# Patient Record
Sex: Male | Born: 1945
Health system: Southern US, Community
[De-identification: ages and names within clinical notes are randomized; demographics above are authoritative.]

## PROBLEM LIST (undated history)

## (undated) DIAGNOSIS — E039 Hypothyroidism, unspecified: Secondary | ICD-10-CM

## (undated) DIAGNOSIS — E785 Hyperlipidemia, unspecified: Secondary | ICD-10-CM

## (undated) DIAGNOSIS — R109 Unspecified abdominal pain: Secondary | ICD-10-CM

## (undated) DIAGNOSIS — C61 Malignant neoplasm of prostate: Secondary | ICD-10-CM

## (undated) DIAGNOSIS — N529 Male erectile dysfunction, unspecified: Secondary | ICD-10-CM

## (undated) DIAGNOSIS — R42 Dizziness and giddiness: Secondary | ICD-10-CM

## (undated) DIAGNOSIS — M1990 Unspecified osteoarthritis, unspecified site: Secondary | ICD-10-CM

## (undated) DIAGNOSIS — I10 Essential (primary) hypertension: Secondary | ICD-10-CM

## (undated) HISTORY — DX: Unspecified abdominal pain: R10.9

## (undated) HISTORY — DX: Essential (primary) hypertension: I10

## (undated) HISTORY — DX: Unspecified osteoarthritis, unspecified site: M19.90

## (undated) HISTORY — DX: Hypothyroidism, unspecified: E03.9

## (undated) HISTORY — PX: PROSTATE SURGERY: SHX751

## (undated) HISTORY — DX: Male erectile dysfunction, unspecified: N52.9

## (undated) HISTORY — DX: Malignant neoplasm of prostate: C61

## (undated) HISTORY — DX: Hyperlipidemia, unspecified: E78.5

---

## 2004-01-13 ENCOUNTER — Other Ambulatory Visit: Payer: Self-pay

## 2004-11-16 HISTORY — PX: COLONOSCOPY: SHX174

## 2004-11-16 HISTORY — PX: OTHER SURGICAL HISTORY: SHX169

## 2012-03-21 DIAGNOSIS — R1011 Right upper quadrant pain: Secondary | ICD-10-CM | POA: Diagnosis not present

## 2012-03-21 DIAGNOSIS — E7800 Pure hypercholesterolemia, unspecified: Secondary | ICD-10-CM | POA: Diagnosis not present

## 2012-03-21 DIAGNOSIS — E039 Hypothyroidism, unspecified: Secondary | ICD-10-CM | POA: Diagnosis not present

## 2012-03-21 DIAGNOSIS — Z79899 Other long term (current) drug therapy: Secondary | ICD-10-CM | POA: Diagnosis not present

## 2012-03-23 ENCOUNTER — Ambulatory Visit: Payer: Self-pay | Admitting: Family Medicine

## 2012-03-23 DIAGNOSIS — R1011 Right upper quadrant pain: Secondary | ICD-10-CM | POA: Diagnosis not present

## 2012-08-18 DIAGNOSIS — H00019 Hordeolum externum unspecified eye, unspecified eyelid: Secondary | ICD-10-CM | POA: Diagnosis not present

## 2012-08-24 DIAGNOSIS — H40009 Preglaucoma, unspecified, unspecified eye: Secondary | ICD-10-CM | POA: Diagnosis not present

## 2012-09-07 DIAGNOSIS — H40009 Preglaucoma, unspecified, unspecified eye: Secondary | ICD-10-CM | POA: Diagnosis not present

## 2012-09-14 DIAGNOSIS — H40009 Preglaucoma, unspecified, unspecified eye: Secondary | ICD-10-CM | POA: Diagnosis not present

## 2012-09-21 DIAGNOSIS — H40009 Preglaucoma, unspecified, unspecified eye: Secondary | ICD-10-CM | POA: Diagnosis not present

## 2013-01-12 DIAGNOSIS — I10 Essential (primary) hypertension: Secondary | ICD-10-CM | POA: Diagnosis not present

## 2013-01-12 DIAGNOSIS — E039 Hypothyroidism, unspecified: Secondary | ICD-10-CM | POA: Diagnosis not present

## 2013-01-12 DIAGNOSIS — E785 Hyperlipidemia, unspecified: Secondary | ICD-10-CM | POA: Diagnosis not present

## 2013-01-12 DIAGNOSIS — I43 Cardiomyopathy in diseases classified elsewhere: Secondary | ICD-10-CM | POA: Diagnosis not present

## 2013-03-20 DIAGNOSIS — H40009 Preglaucoma, unspecified, unspecified eye: Secondary | ICD-10-CM | POA: Diagnosis not present

## 2013-04-04 DIAGNOSIS — H40009 Preglaucoma, unspecified, unspecified eye: Secondary | ICD-10-CM | POA: Diagnosis not present

## 2013-09-25 DIAGNOSIS — Z23 Encounter for immunization: Secondary | ICD-10-CM | POA: Diagnosis not present

## 2013-09-25 DIAGNOSIS — I10 Essential (primary) hypertension: Secondary | ICD-10-CM | POA: Diagnosis not present

## 2013-09-25 DIAGNOSIS — I43 Cardiomyopathy in diseases classified elsewhere: Secondary | ICD-10-CM | POA: Diagnosis not present

## 2013-09-25 DIAGNOSIS — E039 Hypothyroidism, unspecified: Secondary | ICD-10-CM | POA: Diagnosis not present

## 2013-09-25 DIAGNOSIS — E785 Hyperlipidemia, unspecified: Secondary | ICD-10-CM | POA: Diagnosis not present

## 2013-10-02 DIAGNOSIS — H40009 Preglaucoma, unspecified, unspecified eye: Secondary | ICD-10-CM | POA: Diagnosis not present

## 2013-10-10 DIAGNOSIS — N529 Male erectile dysfunction, unspecified: Secondary | ICD-10-CM | POA: Diagnosis not present

## 2013-10-10 DIAGNOSIS — C61 Malignant neoplasm of prostate: Secondary | ICD-10-CM | POA: Diagnosis not present

## 2013-11-23 DIAGNOSIS — C61 Malignant neoplasm of prostate: Secondary | ICD-10-CM | POA: Diagnosis not present

## 2013-11-23 DIAGNOSIS — N529 Male erectile dysfunction, unspecified: Secondary | ICD-10-CM | POA: Diagnosis not present

## 2014-04-02 DIAGNOSIS — H40009 Preglaucoma, unspecified, unspecified eye: Secondary | ICD-10-CM | POA: Diagnosis not present

## 2014-06-01 DIAGNOSIS — M778 Other enthesopathies, not elsewhere classified: Secondary | ICD-10-CM | POA: Diagnosis not present

## 2014-06-05 DIAGNOSIS — I43 Cardiomyopathy in diseases classified elsewhere: Secondary | ICD-10-CM | POA: Diagnosis not present

## 2014-06-05 DIAGNOSIS — I10 Essential (primary) hypertension: Secondary | ICD-10-CM | POA: Diagnosis not present

## 2014-06-05 DIAGNOSIS — E785 Hyperlipidemia, unspecified: Secondary | ICD-10-CM | POA: Diagnosis not present

## 2014-06-05 DIAGNOSIS — E039 Hypothyroidism, unspecified: Secondary | ICD-10-CM | POA: Diagnosis not present

## 2014-10-18 DIAGNOSIS — I43 Cardiomyopathy in diseases classified elsewhere: Secondary | ICD-10-CM | POA: Diagnosis not present

## 2014-10-18 DIAGNOSIS — E039 Hypothyroidism, unspecified: Secondary | ICD-10-CM | POA: Diagnosis not present

## 2014-10-18 DIAGNOSIS — E785 Hyperlipidemia, unspecified: Secondary | ICD-10-CM | POA: Diagnosis not present

## 2014-10-18 DIAGNOSIS — I10 Essential (primary) hypertension: Secondary | ICD-10-CM | POA: Diagnosis not present

## 2014-10-22 DIAGNOSIS — E039 Hypothyroidism, unspecified: Secondary | ICD-10-CM | POA: Diagnosis not present

## 2014-10-22 DIAGNOSIS — I10 Essential (primary) hypertension: Secondary | ICD-10-CM | POA: Diagnosis not present

## 2014-10-22 DIAGNOSIS — E785 Hyperlipidemia, unspecified: Secondary | ICD-10-CM | POA: Diagnosis not present

## 2015-05-29 ENCOUNTER — Encounter: Payer: Self-pay | Admitting: *Deleted

## 2015-05-30 ENCOUNTER — Other Ambulatory Visit: Payer: Self-pay | Admitting: Family Medicine

## 2015-05-30 DIAGNOSIS — E785 Hyperlipidemia, unspecified: Secondary | ICD-10-CM

## 2015-05-30 DIAGNOSIS — I10 Essential (primary) hypertension: Secondary | ICD-10-CM

## 2015-05-30 DIAGNOSIS — E039 Hypothyroidism, unspecified: Secondary | ICD-10-CM

## 2015-06-04 ENCOUNTER — Ambulatory Visit (INDEPENDENT_AMBULATORY_CARE_PROVIDER_SITE_OTHER): Payer: Medicare Other | Admitting: Urology

## 2015-06-04 ENCOUNTER — Encounter: Payer: Self-pay | Admitting: Urology

## 2015-06-04 VITALS — BP 132/86 | HR 81 | Resp 18 | Ht 67.0 in | Wt 204.7 lb

## 2015-06-04 DIAGNOSIS — N5231 Erectile dysfunction following radical prostatectomy: Secondary | ICD-10-CM

## 2015-06-04 DIAGNOSIS — Z8546 Personal history of malignant neoplasm of prostate: Secondary | ICD-10-CM

## 2015-06-04 DIAGNOSIS — N529 Male erectile dysfunction, unspecified: Secondary | ICD-10-CM | POA: Insufficient documentation

## 2015-06-04 LAB — BLADDER SCAN AMB NON-IMAGING

## 2015-06-04 NOTE — Progress Notes (Signed)
06/04/2015 5:17 PM   Daniel Malady Sr. 1946/08/29 [id number]  Referring provider: No referring provider defined for this encounter.  Chief Complaint  Patient presents with  . Erectile Dysfunction    6 month recheck  . Prostate Cancer    HPI: [name] is a 69 year old African-American male with a history of prostate cancer who is status post robotic prostatectomy in 2006 with adjunctive radiation for positive surgical margins.  PCa History:  He underwent prostate biopsy on 05/25/05 for a PSA of 8.2 ng/mL, which showed bilateral prostatic adenocarcinoma, Gleason 3 + 4, in the left apex, right mid gland, and right base, 3/6 cores positive. The patient was sent to [facility] for further evaluation and care.  The patient was evaluated by Dr. Lyn Henri. He underwent laparoscopic robotic radical prostatectomy and right pelvic lymph node dissection on 08/26/05. Pathology showed bilateral prostatic adenocarcinoma, Gleason 3 + 4, involving 20% of the gland, no extracapsular extension, no seminal vesical invasion, positive extensive perineural invasion, no vascular invasion, positive right peripheral surgical margins, and no regional lymph nodes. The patient's postoperative course has been uncomplicated. However, given the positive margins, the patient now presents to our clinic for a discussion of further treatment options. The patient requests we proceed with radiation treatment. He then received adjuvant radiation therapy which he then completed in 01/2006. Since that point in time, his PSA's have been undetectable.    Patient also suffers from erectile dysfunction his SH IM score is 5, which is severe erectile dysfunction function. He does admit to nocturnal tumescence, but he is unable to achieve or maintain an erection satisfactory enough for intercourse.  He has tried PDE 5 inhibitors in the past and Trymex injections with limited success. When I saw him last year we have  briefly discussed penile prosthesis placement and I have given him a DVD. He did have the opportunity to watch the video, but he does not want to pursue surgical intervention at this time.      SHIM      06/04/15 1652       SHIM: Over the last 6 months:   How do you rate your confidence that you could get and keep an erection? Very Low     When you had erections with sexual stimulation, how often were your erections hard enough for penetration (entering your partner)? Almost Never or Never     During sexual intercourse, how often were you able to maintain your erection after you had penetrated (entered) your partner? Extremely Difficult     During sexual intercourse, how difficult was it to maintain your erection to completion of intercourse? Extremely Difficult     When you attempted sexual intercourse, how often was it satisfactory for you? Extremely Difficult     SHIM Total Score   SHIM 5        Score: 1-7 Severe ED 8-11 Moderate ED 12-16 Mild-Moderate ED 17-21 Mild ED 22-25 No ED       PMH: Past Medical History  Diagnosis Date  . Prostate cancer   . ED (erectile dysfunction)   . HLD (hyperlipidemia)   . Abdominal pain     RUQ  . Hypothyroid   . Hypertension     Surgical History: Past Surgical History  Procedure Laterality Date  . Robotic prostatectomy  2006    Home Medications:    Medication List       This list is accurate as of: 06/04/15  5:17  PM.  Always use your most recent med list.               benazepril 10 MG tablet  Commonly known as:  LOTENSIN  Take 10 mg by mouth daily.     benazepril-hydrochlorthiazide 10-12.5 MG per tablet  Commonly known as:  LOTENSIN HCT  TAKE 1 TABLET BY MOUTH EVERY DAY     pravastatin 10 MG tablet  Commonly known as:  PRAVACHOL  TAKE 1 TABLET BY MOUTH EVERY DAY     SYNTHROID 100 MCG tablet  Generic drug:  levothyroxine  TAKE 1 TABLET BY MOUTH EVERY DAY        Allergies: No Known Allergies  Family  History: Family History  Problem Relation Age of Onset  . Diabetes    . Heart attack Father     Social History:  reports that he has quit smoking. He does not have any smokeless tobacco history on file. He reports that he drinks alcohol. His drug history is not on file.  ROS: UROLOGY Frequent Urination?: No Hard to postpone urination?: No Burning/pain with urination?: No Get up at night to urinate?: No Leakage of urine?: No Urine stream starts and stops?: No Trouble starting stream?: No Do you have to strain to urinate?: No Blood in urine?: No Urinary tract infection?: No Sexually transmitted disease?: No Injury to kidneys or bladder?: No Painful intercourse?: No Weak stream?: No Erection problems?: Yes Penile pain?: No  Gastrointestinal Nausea?: No Vomiting?: No Indigestion/heartburn?: No Diarrhea?: No Constipation?: No  Constitutional Fever: No Night sweats?: No Weight loss?: No Fatigue?: No  Skin Skin rash/lesions?: No Itching?: No  Eyes Blurred vision?: No Double vision?: No  Ears/Nose/Throat Sore throat?: No Sinus problems?: No  Hematologic/Lymphatic Swollen glands?: No Easy bruising?: No  Cardiovascular Leg swelling?: No Chest pain?: No  Respiratory Cough?: No Shortness of breath?: No  Endocrine Excessive thirst?: No  Musculoskeletal Back pain?: No Joint pain?: No  Neurological Headaches?: No Dizziness?: No  Psychologic Depression?: No Anxiety?: No  Physical Exam: BP 132/86 mmHg  Pulse 81  Resp 18  Ht 5\' 7"  (1.702 m)  Wt 204 lb 11.2 oz (92.851 kg)  BMI 32.05 kg/m2  GU: Patient with circumcised phallus.  Urethral meatus is patent.  No penile discharge. No penile lesions or rashes. Scrotum without lesions, cysts, rashes and/or edema.  Testicles are located scrotally bilaterally. No masses are appreciated in the testicles. Left and right epididymis are normal.  Rectal: Patient with  normal sphincter tone. Perineum without  scarring or rashes. No rectal masses are appreciated. Prostate and seminal vesicles are surgically absent.   Laboratory Data: No results found for: WBC, HGB, HCT, MCV, PLT  No results found for: CREATININE  No results found for: PSA  No results found for: TESTOSTERONE  No results found for: HGBA1C  Urinalysis No results found for: COLORURINE, APPEARANCEUR, LABSPEC, PHURINE, GLUCOSEU, HGBUR, BILIRUBINUR, KETONESUR, PROTEINUR, UROBILINOGEN, NITRITE, LEUKOCYTESUR  Pertinent Imaging:   Assessment & Plan:    1. Erectile dysfunction:  Patient has suffered with erectile dysfunction since his robotic prostatectomy in 2006. He has tried PDE 5 inhibitors and Tri-mix injections with limited success. He did review the DVD for penile prosthesis, but he is not interested in surgical intervention at this time. He would like to try PDE 5 inhibitor again and if that is ineffective he would like to try the tri-mix injections. I have given him samples of 20 mg on demand Cialis. If they are ineffective, he will  [facility] his tri-mix prescription and schedule a tri-mix injection with [place].  1. History of prostate cancer:  3+4=7, pT2c NXMX adenocarcinoma of the prostate, status post radical prostatectomy 08/26/05  Gleason 3+4=7 prostate cancer.  - (08/2005) robotic prostatectomy - Gleason 3+4=7, pT2c NXMX  - (08/2005-01/2006) adjuvant radiation therapy - PSA undetectable  - PSA - BLADDER SCAN AMB NON-IMAGING   No Follow-up on file.  Zara Council, [facility] [address] [place], [place] [address] [phone]

## 2015-06-05 ENCOUNTER — Telehealth: Payer: Self-pay

## 2015-06-05 LAB — PSA: Prostate Specific Ag, Serum: <0.1 ng/mL (ref 0.0–4.0)

## 2015-06-05 NOTE — Telephone Encounter (Signed)
Spoke with pt in reference to PSA. Pt states he has not used the Cialis yet, but will call back with results.

## 2015-06-05 NOTE — Telephone Encounter (Signed)
-----   Message from Nori Riis, PA-C sent at 06/05/2015 10:15 AM EDT ----- PSA remains undetectable.  Patient was given Cialis samples at his appointment. Did he find them effective?

## 2015-06-24 ENCOUNTER — Ambulatory Visit: Payer: Self-pay | Admitting: Urology

## 2015-07-11 ENCOUNTER — Other Ambulatory Visit: Payer: Self-pay

## 2015-07-11 DIAGNOSIS — I10 Essential (primary) hypertension: Secondary | ICD-10-CM

## 2015-07-11 DIAGNOSIS — E039 Hypothyroidism, unspecified: Secondary | ICD-10-CM

## 2015-07-11 MED ORDER — LEVOTHYROXINE SODIUM 100 MCG PO TABS: 100.0000 ug | ORAL_TABLET | Freq: Every day | ORAL | Status: DC

## 2015-07-11 MED ORDER — BENAZEPRIL-HYDROCHLOROTHIAZIDE 10-12.5 MG PO TABS: 1.0000 | ORAL_TABLET | Freq: Every day | ORAL | Status: DC

## 2015-07-24 ENCOUNTER — Ambulatory Visit (INDEPENDENT_AMBULATORY_CARE_PROVIDER_SITE_OTHER): Payer: Medicare Other | Admitting: Family Medicine

## 2015-07-24 ENCOUNTER — Telehealth: Payer: Self-pay | Admitting: Urology

## 2015-07-24 ENCOUNTER — Encounter: Payer: Self-pay | Admitting: Family Medicine

## 2015-07-24 VITALS — BP 122/72 | HR 80 | Ht 67.0 in | Wt 207.0 lb

## 2015-07-24 DIAGNOSIS — M19072 Primary osteoarthritis, left ankle and foot: Secondary | ICD-10-CM | POA: Diagnosis not present

## 2015-07-24 DIAGNOSIS — H8309 Labyrinthitis, unspecified ear: Secondary | ICD-10-CM

## 2015-07-24 DIAGNOSIS — E785 Hyperlipidemia, unspecified: Secondary | ICD-10-CM | POA: Diagnosis not present

## 2015-07-24 DIAGNOSIS — F321 Major depressive disorder, single episode, moderate: Secondary | ICD-10-CM

## 2015-07-24 DIAGNOSIS — Z1212 Encounter for screening for malignant neoplasm of rectum: Secondary | ICD-10-CM | POA: Diagnosis not present

## 2015-07-24 DIAGNOSIS — E039 Hypothyroidism, unspecified: Secondary | ICD-10-CM | POA: Diagnosis not present

## 2015-07-24 DIAGNOSIS — I10 Essential (primary) hypertension: Secondary | ICD-10-CM

## 2015-07-24 DIAGNOSIS — R739 Hyperglycemia, unspecified: Secondary | ICD-10-CM

## 2015-07-24 LAB — POCT URINALYSIS DIPSTICK
BILIRUBIN UA: NEGATIVE
Glucose, UA: NEGATIVE
KETONES UA: NEGATIVE
Leukocytes, UA: NEGATIVE
Nitrite, UA: NEGATIVE
PH UA: 5
PROTEIN UA: NEGATIVE
RBC UA: NEGATIVE
Spec Grav, UA: 1.01
Urobilinogen, UA: 0.2

## 2015-07-24 LAB — HEMOCCULT GUIAC POC 1CARD (OFFICE): Fecal Occult Blood, POC: POSITIVE — AB

## 2015-07-24 MED ORDER — BENAZEPRIL-HYDROCHLOROTHIAZIDE 10-12.5 MG PO TABS: 1.0000 | ORAL_TABLET | Freq: Every day | ORAL | Status: DC

## 2015-07-24 MED ORDER — LEVOTHYROXINE SODIUM 100 MCG PO TABS: 100.0000 ug | ORAL_TABLET | Freq: Every day | ORAL | Status: DC

## 2015-07-24 MED ORDER — MELOXICAM 7.5 MG PO TABS: 7.5000 mg | ORAL_TABLET | Freq: Every day | ORAL | Status: DC

## 2015-07-24 MED ORDER — MECLIZINE HCL 32 MG PO TABS: 32.0000 mg | ORAL_TABLET | Freq: Three times a day (TID) | ORAL | Status: DC | PRN

## 2015-07-24 MED ORDER — SERTRALINE HCL 50 MG PO TABS: 50.0000 mg | ORAL_TABLET | Freq: Every day | ORAL | Status: DC

## 2015-07-24 MED ORDER — PRAVASTATIN SODIUM 10 MG PO TABS: 10.0000 mg | ORAL_TABLET | Freq: Every day | ORAL | Status: DC

## 2015-07-24 NOTE — Telephone Encounter (Signed)
Returning pt's call re: test results.  Left a message requesting a call back.

## 2015-07-24 NOTE — Progress Notes (Signed)
Name: Daniel Kirby   MRN: [id number]    DOB: Mar 02, 1946   Date:07/24/2015       Progress Note  Subjective  Chief Complaint  Chief Complaint  Patient presents with  . Hypothyroidism  . Hypertension  . Hyperlipidemia  . Depression    Hypertension This is a chronic problem. The current episode started more than 1 year ago. The problem has been gradually improving since onset. Pertinent negatives include no anxiety, blurred vision, chest pain, headaches, malaise/fatigue, neck pain, orthopnea, palpitations, peripheral edema, PND, shortness of breath or sweats. There are no associated agents to hypertension. Risk factors for coronary artery disease include dyslipidemia, obesity and male gender. Past treatments include ACE inhibitors and diuretics. The current treatment provides mild improvement. There are no compliance problems.  There is no history of angina, kidney disease, CAD/MI, CVA, heart failure, left ventricular hypertrophy, PVD, renovascular disease or retinopathy. There is no history of chronic renal disease.  Hyperlipidemia This is a chronic problem. The problem is controlled. Recent lipid tests were reviewed and are normal. He has no history of chronic renal disease, diabetes, hypothyroidism, liver disease, obesity or nephrotic syndrome. Pertinent negatives include no chest pain, focal sensory loss, focal weakness, leg pain, myalgias or shortness of breath. Current antihyperlipidemic treatment includes statins. The current treatment provides no improvement of lipids. There are no compliance problems.  Risk factors for coronary artery disease include dyslipidemia.  Depression        This is a new problem.  The current episode started more than 1 month ago.   The onset quality is gradual.   Associated symptoms include no decreased concentration, no fatigue, no helplessness, no hopelessness, not irritable, no restlessness, no decreased interest, no appetite change, no body aches, no myalgias, no  headaches, no indigestion, not sad and no suicidal ideas.  Past treatments include nothing.   Pertinent negatives include no hypothyroidism and no anxiety. Diabetes He presents for his initial diabetic visit. Pertinent negatives for hypoglycemia include no confusion, dizziness, headaches, hunger, mood changes, nervousness/anxiousness, pallor, seizures, sleepiness, speech difficulty, sweats or tremors. Pertinent negatives for diabetes include no blurred vision, no chest pain, no fatigue, no foot paresthesias, no foot ulcerations, no polydipsia, no polyphagia, no polyuria, no visual change, no weakness and no weight loss. Pertinent negatives for diabetic complications include no autonomic neuropathy, CVA, impotence, nephropathy, PVD or retinopathy. His lunch blood glucose is taken between 2-3 pm. His lunch blood glucose range is generally 140-180 mg/dl. An ACE inhibitor/angiotensin II receptor blocker is being taken. He does not see a podiatrist.Eye exam is not current.    No problem-specific assessment & plan notes found for this encounter.   Past Medical History  Diagnosis Date  . Prostate cancer   . ED (erectile dysfunction)   . HLD (hyperlipidemia)   . Abdominal pain     RUQ  . Hypothyroid   . Hypertension   . Arthritis     Past Surgical History  Procedure Laterality Date  . Robotic prostatectomy  2006  . Colonoscopy  2006    normal    Family History  Problem Relation Age of Onset  . Diabetes    . Heart attack Father     Social History   Social History  . Marital Status: Married    Spouse Name: N/A  . Number of Children: N/A  . Years of Education: N/A   Occupational History  . Not on file.   Social History Main Topics  . Smoking status:  Former Smoker  . Smokeless tobacco: Not on file     Comment: smokes a cigar every 6 months  . Alcohol Use: 0.0 oz/week    0 Standard drinks or equivalent per week     Comment: occasional  . Drug Use: No  . Sexual Activity: Yes    Other Topics Concern  . Not on file   Social History Narrative    No Known Allergies   Review of Systems  Constitutional: Negative for weight loss, malaise/fatigue, appetite change and fatigue.  Eyes: Negative for blurred vision.  Respiratory: Negative for shortness of breath.   Cardiovascular: Negative for chest pain, palpitations, orthopnea and PND.  Genitourinary: Negative for impotence.  Musculoskeletal: Negative for myalgias and neck pain.  Skin: Negative for pallor.  Neurological: Negative for dizziness, tremors, focal weakness, seizures, speech difficulty, weakness and headaches.  Endo/Heme/Allergies: Negative for polydipsia and polyphagia.  Psychiatric/Behavioral: Positive for depression. Negative for suicidal ideas, confusion and decreased concentration. The patient is not nervous/anxious.      Objective  Filed Vitals:   07/24/15 0912  BP: 122/72  Pulse: 80  Height: 5\' 7"  (1.702 m)  Weight: 207 lb (93.895 kg)    Physical Exam  Constitutional: He is oriented to person, place, and time and well-developed, well-nourished, and in no distress. He is not irritable.  HENT:  Head: Normocephalic.  Right Ear: External ear normal.  Left Ear: External ear normal.  Nose: Nose normal.  Mouth/Throat: Oropharynx is clear and moist.  Eyes: Conjunctivae and EOM are normal. Pupils are equal, round, and reactive to light. Right eye exhibits no discharge. Left eye exhibits no discharge. No scleral icterus.  Neck: Normal range of motion. Neck supple. No JVD present. No tracheal deviation present. No thyromegaly present.  Cardiovascular: Normal rate, regular rhythm, normal heart sounds and intact distal pulses.  Exam reveals no gallop and no friction rub.   No murmur heard. Pulmonary/Chest: Breath sounds normal. No respiratory distress. He has no wheezes. He has no rales.  Abdominal: Soft. Bowel sounds are normal. He exhibits no mass. There is no hepatosplenomegaly. There is no  tenderness. There is no rebound, no guarding and no CVA tenderness.  Genitourinary: Rectal exam shows no external hemorrhoid, no internal hemorrhoid, no mass and no tenderness. Guaiac positive stool. Prostate is enlarged.  Musculoskeletal: Normal range of motion. He exhibits no edema or tenderness.  Lymphadenopathy:    He has no cervical adenopathy.  Neurological: He is alert and oriented to person, place, and time. He has normal sensation, normal strength, normal reflexes and intact cranial nerves. No cranial nerve deficit.  Skin: Skin is warm. No rash noted.  Psychiatric: Mood and affect normal.      Assessment & Plan  Problem List Items Addressed This Visit    None    Visit Diagnoses    Essential hypertension    -  Primary    Relevant Medications    benazepril-hydrochlorthiazide (LOTENSIN HCT) 10-12.5 MG per tablet    pravastatin (PRAVACHOL) 10 MG tablet    Other Relevant Orders    Renal Function Panel    POCT Urinalysis Dipstick (Completed)    Hyperlipidemia        Relevant Medications    benazepril-hydrochlorthiazide (LOTENSIN HCT) 10-12.5 MG per tablet    pravastatin (PRAVACHOL) 10 MG tablet    Other Relevant Orders    Lipid Profile    Hypothyroidism, unspecified hypothyroidism type        Relevant Medications    levothyroxine (SYNTHROID)  100 MCG tablet    Other Relevant Orders    TSH    Major depressive disorder, single episode, moderate        Relevant Medications    sertraline (ZOLOFT) 50 MG tablet    Hyperglycemia        Relevant Orders    HgB A1c    Primary osteoarthritis of left foot        Relevant Medications    meloxicam (MOBIC) 7.5 MG tablet    Screening for rectal cancer        Relevant Orders    POCT Occult Blood Stool (Completed)    Labyrinthitis, unspecified laterality        Relevant Medications    meclizine (ANTIVERT) 32 MG tablet         Dr. Deanna Jones [facility]  07/24/2015

## 2015-07-24 NOTE — Telephone Encounter (Signed)
Pt requested the date of his last colonoscopy.  I gave him the information he requested; he had no further questions. . . sm

## 2015-07-24 NOTE — Addendum Note (Signed)
Addended by: Fredderick Severance on: 07/24/2015 04:49 PM   Modules accepted: Orders

## 2015-07-24 NOTE — Patient Instructions (Signed)

## 2015-07-25 LAB — TSH: TSH: 1.27 u[IU]/mL (ref 0.450–4.500)

## 2015-07-25 LAB — LIPID PANEL
CHOLESTEROL TOTAL: 151 mg/dL (ref 100–199)
Chol/HDL Ratio: 3.8 ratio units (ref 0.0–5.0)
HDL: 40 mg/dL (ref >39)
LDL Calculated: 91 mg/dL (ref 0–99)
Triglycerides: 99 mg/dL (ref 0–149)
VLDL Cholesterol Cal: 20 mg/dL (ref 5–40)

## 2015-07-25 LAB — HEMOGLOBIN A1C
Est. average glucose Bld gHb Est-mCnc: 117 mg/dL
HEMOGLOBIN A1C: 5.7 % — AB (ref 4.8–5.6)

## 2015-07-25 LAB — RENAL FUNCTION PANEL
ALBUMIN: 4.4 g/dL (ref 3.6–4.8)
BUN/Creatinine Ratio: 20 (ref 10–22)
BUN: 18 mg/dL (ref 8–27)
CALCIUM: 9.7 mg/dL (ref 8.6–10.2)
CHLORIDE: 98 mmol/L (ref 97–108)
CO2: 22 mmol/L (ref 18–29)
Creatinine, Ser: 0.91 mg/dL (ref 0.76–1.27)
GFR calc non Af Amer: 86 mL/min/{1.73_m2} (ref >59)
GFR, EST AFRICAN AMERICAN: 99 mL/min/{1.73_m2} (ref >59)
GLUCOSE: 91 mg/dL (ref 65–99)
PHOSPHORUS: 2.6 mg/dL (ref 2.5–4.5)
POTASSIUM: 4 mmol/L (ref 3.5–5.2)
Sodium: 137 mmol/L (ref 134–144)

## 2015-07-31 ENCOUNTER — Other Ambulatory Visit: Payer: Self-pay

## 2015-07-31 ENCOUNTER — Telehealth: Payer: Self-pay | Admitting: Gastroenterology

## 2015-07-31 NOTE — Telephone Encounter (Signed)
Gastroenterology Pre-Procedure Review  Request Date: 08-30-2015 Requesting Physician: Dr.   PATIENT REVIEW QUESTIONS: The patient responded to the following health history questions as indicated:    1. Are you having any GI issues? no 2. Do you have a personal history of Polyps? no 3. Do you have a family history of Colon Cancer or Polyps? no 4. Diabetes Mellitus? no 5. Joint replacements in the past 12 months?no 6. Major health problems in the past 3 months?no 7. Any artificial heart valves, MVP, or defibrillator?no    MEDICATIONS & ALLERGIES:    Patient reports the following regarding taking any anticoagulation/antiplatelet therapy:   Plavix, Coumadin, Eliquis, Xarelto, Lovenox, Pradaxa, Brilinta, or Effient? no Aspirin? no  Patient confirms/reports the following medications:  Current Outpatient Prescriptions  Medication Sig Dispense Refill   benazepril-hydrochlorthiazide (LOTENSIN HCT) 10-12.5 MG per tablet Take 1 tablet by mouth daily. 30 tablet 6   levothyroxine (SYNTHROID) 100 MCG tablet Take 1 tablet (100 mcg total) by mouth daily. 30 tablet 6   meclizine (ANTIVERT) 32 MG tablet Take 1 tablet (32 mg total) by mouth 3 (three) times daily as needed. 30 tablet 1   meloxicam (MOBIC) 7.5 MG tablet Take 1 tablet (7.5 mg total) by mouth daily. 30 tablet 1   pravastatin (PRAVACHOL) 10 MG tablet Take 1 tablet (10 mg total) by mouth daily. 30 tablet 5   sertraline (ZOLOFT) 50 MG tablet Take 1 tablet (50 mg total) by mouth daily. 30 tablet 3   No current facility-administered medications for this visit.    Patient confirms/reports the following allergies:  No Known Allergies  No orders of the defined types were placed in this encounter.    AUTHORIZATION INFORMATION Primary Insurance: 1D#: Group #:  Secondary Insurance: 1D#: Group #:  SCHEDULE INFORMATION: Date: 08-30-2015 Time: Location:[facility]

## 2015-08-05 ENCOUNTER — Other Ambulatory Visit: Payer: Self-pay

## 2015-08-07 ENCOUNTER — Other Ambulatory Visit: Payer: Self-pay

## 2015-08-08 ENCOUNTER — Ambulatory Visit: Payer: Medicare Other | Admitting: Family Medicine

## 2015-08-20 ENCOUNTER — Encounter: Payer: Self-pay | Admitting: Family Medicine

## 2015-08-20 ENCOUNTER — Ambulatory Visit (INDEPENDENT_AMBULATORY_CARE_PROVIDER_SITE_OTHER): Payer: Medicare Other | Admitting: Family Medicine

## 2015-08-20 VITALS — BP 130/64 | HR 76 | Ht 67.0 in | Wt 204.0 lb

## 2015-08-20 DIAGNOSIS — F324 Major depressive disorder, single episode, in partial remission: Secondary | ICD-10-CM | POA: Diagnosis not present

## 2015-08-20 DIAGNOSIS — Z23 Encounter for immunization: Secondary | ICD-10-CM

## 2015-08-20 DIAGNOSIS — F321 Major depressive disorder, single episode, moderate: Secondary | ICD-10-CM | POA: Diagnosis not present

## 2015-08-20 MED ORDER — SERTRALINE HCL 50 MG PO TABS: 50.0000 mg | ORAL_TABLET | Freq: Every day | ORAL | Status: DC

## 2015-08-20 NOTE — Progress Notes (Signed)
Name: Daniel Kirby   MRN: [id number]    DOB: [birth year]   Date:08/20/2015       Progress Note  Subjective  Chief Complaint  Chief Complaint  Patient presents with  . Depression    started Sertraline- feeling better "about to be back to my old self again"    Depression      The patient presents with depression.  This is a new problem.  The current episode started more than 1 month ago.   The onset quality is gradual.   The problem has been gradually improving since onset.  Associated symptoms include no decreased concentration, no fatigue, no helplessness, no hopelessness, does not have insomnia, not irritable, no restlessness, no decreased interest, no appetite change, no body aches, no myalgias, no headaches, no indigestion, not sad and no suicidal ideas.  Past treatments include SSRIs - Selective serotonin reuptake inhibitors.  Compliance with treatment is good.  Past medical history includes depression.     Pertinent negatives include no chronic fatigue syndrome, no chronic illness and no anxiety.   No problem-specific assessment & plan notes found for this encounter.   Past Medical History  Diagnosis Date  . Prostate cancer ([place])   . ED (erectile dysfunction)   . HLD (hyperlipidemia)   . Abdominal pain     RUQ  . Hypothyroid   . Hypertension   . Arthritis     Past Surgical History  Procedure Laterality Date  . Robotic prostatectomy  2006  . Colonoscopy  2006    normal    Family History  Problem Relation Age of Onset  . Diabetes    . Heart attack Father     Social History   Social History  . Marital Status: Married    Spouse Name: N/A  . Number of Children: N/A  . Years of Education: N/A   Occupational History  . Not on file.   Social History Main Topics  . Smoking status: Former Research scientist (life sciences)  . Smokeless tobacco: Not on file     Comment: smokes a cigar every 6 months  . Alcohol Use: 0.0 oz/week    0 Standard drinks or equivalent per week     Comment:  occasional  . Drug Use: No  . Sexual Activity: Yes   Other Topics Concern  . Not on file   Social History Narrative    No Known Allergies   Review of Systems  Constitutional: Negative for fever, chills, weight loss, malaise/fatigue, appetite change and fatigue.  HENT: Negative for ear discharge, ear pain and sore throat.   Eyes: Negative for blurred vision.  Respiratory: Negative for cough, sputum production, shortness of breath and wheezing.   Cardiovascular: Negative for chest pain, palpitations and leg swelling.  Gastrointestinal: Negative for heartburn, nausea, abdominal pain, diarrhea, constipation, blood in stool and melena.  Genitourinary: Negative for dysuria, urgency, frequency and hematuria.  Musculoskeletal: Negative for myalgias, back pain, joint pain and neck pain.  Skin: Negative for rash.  Neurological: Negative for dizziness, tingling, sensory change, focal weakness and headaches.  Endo/Heme/Allergies: Negative for environmental allergies and polydipsia. Does not bruise/bleed easily.  Psychiatric/Behavioral: Positive for depression. Negative for suicidal ideas and decreased concentration. The patient is not nervous/anxious and does not have insomnia.      Objective  Filed Vitals:   08/20/15 1335  BP: 130/64  Pulse: 76  Height: 5\' 7"  (1.702 m)  Weight: 204 lb (92.534 kg)    Physical Exam  Constitutional: He is oriented  to person, place, and time and well-developed, well-nourished, and in no distress. He is not irritable.  HENT:  Head: Normocephalic.  Right Ear: External ear normal.  Left Ear: External ear normal.  Nose: Nose normal.  Mouth/Throat: Oropharynx is clear and moist.  Eyes: Conjunctivae and EOM are normal. Pupils are equal, round, and reactive to light. Right eye exhibits no discharge. Left eye exhibits no discharge. No scleral icterus.  Neck: Normal range of motion. Neck supple. No JVD present. No tracheal deviation present. No thyromegaly  present.  Cardiovascular: Normal rate, regular rhythm, normal heart sounds and intact distal pulses.  Exam reveals no gallop and no friction rub.   No murmur heard. Pulmonary/Chest: Breath sounds normal. No respiratory distress. He has no wheezes. He has no rales.  Abdominal: Soft. Bowel sounds are normal. He exhibits no mass. There is no hepatosplenomegaly. There is no tenderness. There is no rebound, no guarding and no CVA tenderness.  Musculoskeletal: Normal range of motion. He exhibits no edema or tenderness.  Lymphadenopathy:    He has no cervical adenopathy.  Neurological: He is alert and oriented to person, place, and time. He has normal sensation, normal strength, normal reflexes and intact cranial nerves. No cranial nerve deficit.  Skin: Skin is warm. No rash noted.  Psychiatric: Mood and affect normal.      Assessment & Plan  Problem List Items Addressed This Visit      Other   Major depressive disorder with single episode, in partial remission ([place]) - Primary   Relevant Medications   sertraline (ZOLOFT) 50 MG tablet    Other Visit Diagnoses    Major depressive disorder, single episode, moderate ([facility])        Relevant Medications    sertraline (ZOLOFT) 50 MG tablet         Dr. Ahnaf Caponi [facility]  08/20/2015

## 2015-08-20 NOTE — Addendum Note (Signed)
Addended by: Fredderick Severance on: 08/20/2015 02:08 PM   Modules accepted: Orders

## 2015-08-29 ENCOUNTER — Encounter: Payer: Self-pay | Admitting: *Deleted

## 2015-08-30 NOTE — Anesthesia Preprocedure Evaluation (Deleted)
Anesthesia Evaluation    Airway       Dental   Pulmonary former smoker,          Cardiovascular hypertension,     Neuro/Psych    GI/Hepatic   Endo/Other    Renal/GU      Musculoskeletal   Abdominal   Peds  Hematology   Anesthesia Other Findings   Reproductive/Obstetrics                          Anesthesia Physical Anesthesia Plan Anesthesia Quick Evaluation  

## 2015-09-05 NOTE — Discharge Instructions (Signed)

## 2015-09-06 ENCOUNTER — Ambulatory Visit: Payer: Medicare Other | Admitting: Anesthesiology

## 2015-09-06 ENCOUNTER — Ambulatory Visit
Admission: RE | Admit: 2015-09-06 | Discharge: 2015-09-06 | Disposition: A | Discharge status: Home / Self Care | Payer: Medicare Other | Source: Ambulatory Visit | Attending: Gastroenterology | Admitting: Gastroenterology

## 2015-09-06 ENCOUNTER — Encounter
Admission: RE | Disposition: A | Discharge status: Home / Self Care | Payer: Self-pay | Source: Ambulatory Visit | Attending: Gastroenterology

## 2015-09-06 ENCOUNTER — Other Ambulatory Visit: Payer: Self-pay | Admitting: Gastroenterology

## 2015-09-06 DIAGNOSIS — K641 Second degree hemorrhoids: Secondary | ICD-10-CM | POA: Insufficient documentation

## 2015-09-06 DIAGNOSIS — I10 Essential (primary) hypertension: Secondary | ICD-10-CM | POA: Diagnosis not present

## 2015-09-06 DIAGNOSIS — Z87891 Personal history of nicotine dependence: Secondary | ICD-10-CM | POA: Diagnosis not present

## 2015-09-06 DIAGNOSIS — M13871 Other specified arthritis, right ankle and foot: Secondary | ICD-10-CM | POA: Insufficient documentation

## 2015-09-06 DIAGNOSIS — Z1211 Encounter for screening for malignant neoplasm of colon: Secondary | ICD-10-CM | POA: Diagnosis not present

## 2015-09-06 DIAGNOSIS — E785 Hyperlipidemia, unspecified: Secondary | ICD-10-CM | POA: Insufficient documentation

## 2015-09-06 DIAGNOSIS — E039 Hypothyroidism, unspecified: Secondary | ICD-10-CM | POA: Diagnosis not present

## 2015-09-06 DIAGNOSIS — N529 Male erectile dysfunction, unspecified: Secondary | ICD-10-CM | POA: Insufficient documentation

## 2015-09-06 DIAGNOSIS — Z8546 Personal history of malignant neoplasm of prostate: Secondary | ICD-10-CM | POA: Diagnosis not present

## 2015-09-06 DIAGNOSIS — K635 Polyp of colon: Secondary | ICD-10-CM | POA: Insufficient documentation

## 2015-09-06 DIAGNOSIS — D122 Benign neoplasm of ascending colon: Secondary | ICD-10-CM | POA: Diagnosis not present

## 2015-09-06 HISTORY — PX: COLONOSCOPY WITH PROPOFOL: SHX5780

## 2015-09-06 HISTORY — PX: POLYPECTOMY: SHX5525

## 2015-09-06 HISTORY — DX: Dizziness and giddiness: R42

## 2015-09-06 SURGERY — COLONOSCOPY WITH PROPOFOL
Anesthesia: Monitor Anesthesia Care | Wound class: Contaminated

## 2015-09-06 MED ORDER — PROPOFOL 10 MG/ML IV BOLUS
INTRAVENOUS | Status: DC | PRN
  Administered 2015-09-06 (×2): 50 mg via INTRAVENOUS
  Administered 2015-09-06: 30 mg via INTRAVENOUS
  Administered 2015-09-06: 120 mg via INTRAVENOUS

## 2015-09-06 MED ORDER — LIDOCAINE HCL (CARDIAC) 20 MG/ML IV SOLN
INTRAVENOUS | Status: DC | PRN
  Administered 2015-09-06: 50 mg via INTRAVENOUS

## 2015-09-06 MED ORDER — STERILE WATER FOR IRRIGATION IR SOLN
Status: DC | PRN
  Administered 2015-09-06: 11:00:00

## 2015-09-06 MED ORDER — LACTATED RINGERS IV SOLN
INTRAVENOUS | Status: DC
  Administered 2015-09-06: 10:00:00 via INTRAVENOUS

## 2015-09-06 MED ORDER — LACTATED RINGERS IV SOLN: 500.0000 mL | INTRAVENOUS | Status: DC

## 2015-09-06 SURGICAL SUPPLY — 28 items

## 2015-09-06 NOTE — Anesthesia Postprocedure Evaluation (Signed)
  Anesthesia Post-op Note  Patient: Daniel Kirby  Procedure(s) Performed: Procedure(s): COLONOSCOPY WITH PROPOFOL (N/A) POLYPECTOMY  Anesthesia type:MAC  Patient location: PACU  Post pain: Pain level controlled  Post assessment: Post-op Vital signs reviewed, Patient's Cardiovascular Status Stable, Respiratory Function Stable, Patent Airway and No signs of Nausea or vomiting  Post vital signs: Reviewed and stable  Last Vitals:  Filed Vitals:   09/06/15 1003  BP: 143/84  Pulse: 69  Temp: 36.2 C  Resp: 69    Level of consciousness: awake, alert  and patient cooperative  Complications: No apparent anesthesia complications

## 2015-09-06 NOTE — Transfer of Care (Signed)
Immediate Anesthesia Transfer of Care Note  Patient: Daniel Kirby  Procedure(s) Performed: Procedure(s): COLONOSCOPY WITH PROPOFOL (N/A) POLYPECTOMY  Patient Location: PACU  Anesthesia Type: MAC  Level of Consciousness: awake, alert  and patient cooperative  Airway and Oxygen Therapy: Patient Spontanous Breathing and Patient connected to supplemental oxygen  Post-op Assessment: Post-op Vital signs reviewed, Patient's Cardiovascular Status Stable, Respiratory Function Stable, Patent Airway and No signs of Nausea or vomiting  Post-op Vital Signs: Reviewed and stable  Complications: No apparent anesthesia complications

## 2015-09-06 NOTE — H&P (Signed)
[facility]  [address], [place], [place] [address] Phone: [phone] Fax : [phone]  Primary Care Physician:  Otilio Miu, MD Primary Gastroenterologist:  Dr. Allen Norris  Pre-Procedure History & Physical: HPI:  Daniel Kirby is a 69 y.o. male is here for a screening colonoscopy.   Past Medical History  Diagnosis Date  . Prostate cancer ([place])   . ED (erectile dysfunction)   . HLD (hyperlipidemia)   . Abdominal pain     RUQ  . Hypothyroid   . Hypertension   . Vertigo     1 episode only, approx 1 month ago  . Arthritis     right ankle    Past Surgical History  Procedure Laterality Date  . Robotic prostatectomy  2006  . Colonoscopy  2006    normal    Prior to Admission medications   Medication Sig Start Date End Date Taking? Authorizing Provider  benazepril-hydrochlorthiazide (LOTENSIN HCT) 10-12.5 MG per tablet Take 1 tablet by mouth daily. 07/24/15  Yes Juline Patch, MD  levothyroxine (SYNTHROID) 100 MCG tablet Take 1 tablet (100 mcg total) by mouth daily. 07/24/15  Yes Juline Patch, MD  meclizine (ANTIVERT) 32 MG tablet Take 1 tablet (32 mg total) by mouth 3 (three) times daily as needed. 07/24/15  Yes Juline Patch, MD  meloxicam (MOBIC) 7.5 MG tablet Take 1 tablet (7.5 mg total) by mouth daily. 07/24/15  Yes Juline Patch, MD  pravastatin (PRAVACHOL) 10 MG tablet Take 1 tablet (10 mg total) by mouth daily. 07/24/15  Yes Juline Patch, MD  sertraline (ZOLOFT) 50 MG tablet Take 1 tablet (50 mg total) by mouth daily. 08/20/15  Yes Juline Patch, MD    Allergies as of 07/31/2015  . (No Known Allergies)    Family History  Problem Relation Age of Onset  . Diabetes    . Heart attack Father     Social History   Social History  . Marital Status: Married    Spouse Name: N/A  . Number of Children: N/A  . Years of Education: N/A   Occupational History  . Not on file.   Social History Main Topics  . Smoking status: Former Research scientist (life sciences)  .  Smokeless tobacco: Not on file     Comment: smokes a cigar every 6 months  . Alcohol Use: 0.0 oz/week    0 Standard drinks or equivalent per week     Comment: occasional, 1-2 drinks per year  . Drug Use: No  . Sexual Activity: Yes   Other Topics Concern  . Not on file   Social History Narrative    Review of Systems: See HPI, otherwise negative ROS  Physical Exam: BP 143/84 mmHg  Pulse 69  Temp(Src) 97.2 F (36.2 C) (Temporal)  Resp 69  Ht 5\' 7"  (1.702 m)  Wt 198 lb (89.812 kg)  BMI 31.00 kg/m2  SpO2 99% General:   Alert,  pleasant and cooperative in NAD Head:  Normocephalic and atraumatic. Neck:  Supple; no masses or thyromegaly. Lungs:  Clear throughout to auscultation.    Heart:  Regular rate and rhythm. Abdomen:  Soft, nontender and nondistended. Normal bowel sounds, without guarding, and without rebound.   Neurologic:  Alert and  oriented x4;  grossly normal neurologically.  Impression/Plan: Daniel Kirby is now here to undergo a screening colonoscopy.  Risks, benefits, and alternatives regarding colonoscopy have been reviewed with the patient.  Questions have been answered.  All parties agreeable.

## 2015-09-06 NOTE — Anesthesia Preprocedure Evaluation (Signed)
Anesthesia Evaluation  Patient identified by MRN, date of birth, ID band Patient awake    Reviewed: Allergy & Precautions, H&P , NPO status , Patient's Chart, lab work & pertinent test results, reviewed documented beta blocker date and time   Airway Mallampati: III  TM Distance: >3 FB Neck ROM: full    Dental no notable dental hx.    Pulmonary former smoker,    Pulmonary exam normal breath sounds clear to auscultation       Cardiovascular Exercise Tolerance: Good hypertension, On Medications  Rhythm:regular Rate:Normal     Neuro/Psych PSYCHIATRIC DISORDERS negative neurological ROS     GI/Hepatic negative GI ROS, Neg liver ROS,   Endo/Other  Hypothyroidism   Renal/GU negative Renal ROS  negative genitourinary   Musculoskeletal   Abdominal   Peds  Hematology negative hematology ROS (+)   Anesthesia Other Findings   Reproductive/Obstetrics negative OB ROS                             Anesthesia Physical Anesthesia Plan  ASA: II  Anesthesia Plan: MAC   Post-op Pain Management:    Induction:   Airway Management Planned:   Additional Equipment:   Intra-op Plan:   Post-operative Plan:   Informed Consent: I have reviewed the patients History and Physical, chart, labs and discussed the procedure including the risks, benefits and alternatives for the proposed anesthesia with the patient or authorized representative who has indicated his/her understanding and acceptance.     Plan Discussed with: CRNA  Anesthesia Plan Comments:         Anesthesia Quick Evaluation

## 2015-09-06 NOTE — Op Note (Signed)
[facility]astroenterology Patient Name: Daniel Kirby Procedure Date: 09/06/2015 11:02 AM MRN: [id number] Account #: [id number] Date of Birth: 09/24/1946 Admit Type: Outpatient Age: 69 Room: [facility] OR ROOM 01 Gender: Male Note Status: Finalized Procedure:         Colonoscopy Indications:       Screening for colorectal malignant neoplasm Providers:         Lucilla Lame, MD Referring MD:      Juline Patch, MD (Referring MD) Medicines:         Monitored Anesthesia Care Complications:     No immediate complications. Procedure:         Pre-Anesthesia Assessment:                    - Prior to the procedure, a History and Physical was                     performed, and patient medications and allergies were                     reviewed. The patient's tolerance of previous anesthesia                     was also reviewed. The risks and benefits of the procedure                     and the sedation options and risks were discussed with the                     patient. All questions were answered, and informed consent                     was obtained. Prior Anticoagulants: The patient has taken                     no previous anticoagulant or antiplatelet agents. ASA                     Grade Assessment: II - A patient with mild systemic                     disease. After reviewing the risks and benefits, the                     patient was deemed in satisfactory condition to undergo                     the procedure.                    After obtaining informed consent, the colonoscope was                     passed under direct vision. Throughout the procedure, the                     patient's blood pressure, pulse, and oxygen saturations                     were monitored continuously. The Olympus CF H180AL                     colonoscope (S#: [id number]) was introduced through the anus  and advanced to the the cecum, identified by appendiceal              orifice and ileocecal valve. The colonoscopy was performed                     without difficulty. The patient tolerated the procedure                     well. The quality of the bowel preparation was excellent. Findings:      The perianal and digital rectal examinations were normal.      Two sessile polyps were found in the ascending colon. The polyps were 3       to 4 mm in size. These polyps were removed with a cold biopsy forceps.       Resection and retrieval were complete.      Non-bleeding internal hemorrhoids were found during retroflexion. The       hemorrhoids were Grade II (internal hemorrhoids that prolapse but reduce       spontaneously). Impression:        - Two 3 to 4 mm polyps in the ascending colon. Resected                     and retrieved.                    - Non-bleeding internal hemorrhoids. Recommendation:    - Await pathology results.                    - Repeat colonoscopy in 5 years if polyp adenoma and 10                     years if hyperplastic Procedure Code(s): --- Professional ---                    [phone], Colonoscopy, flexible; with biopsy, single or                     multiple Diagnosis Code(s): --- Professional ---                    Z12.11, Encounter for screening for malignant neoplasm of                     colon                    D12.2, Benign neoplasm of ascending colon CPT copyright 2014 American Medical Association. All rights reserved. The codes documented in this report are preliminary and upon coder review may  be revised to meet current compliance requirements. Lucilla Lame, MD 09/06/2015 11:25:08 AM This report has been signed electronically. Number of Addenda: 0 Note Initiated On: 09/06/2015 11:02 AM Scope Withdrawal Time: 0 hours 7 minutes 54 seconds  Total Procedure Duration: 0 hours 11 minutes 13 seconds       [facility]

## 2015-09-06 NOTE — Anesthesia Procedure Notes (Signed)
Procedure Name: MAC Performed by: Mose Colaizzi Pre-anesthesia Checklist: Patient identified, Emergency Drugs available, Suction available, Timeout performed and Patient being monitored Patient Re-evaluated:Patient Re-evaluated prior to inductionOxygen Delivery Method: Nasal cannula Placement Confirmation: positive ETCO2     

## 2015-09-09 ENCOUNTER — Encounter: Payer: Self-pay | Admitting: Gastroenterology

## 2015-09-17 ENCOUNTER — Encounter: Payer: Self-pay | Admitting: Gastroenterology

## 2015-11-20 ENCOUNTER — Encounter: Payer: Self-pay | Admitting: Family Medicine

## 2015-11-20 ENCOUNTER — Ambulatory Visit (INDEPENDENT_AMBULATORY_CARE_PROVIDER_SITE_OTHER): Payer: Medicare Other | Admitting: Family Medicine

## 2015-11-20 VITALS — BP 140/80 | HR 80 | Ht 67.0 in | Wt 218.0 lb

## 2015-11-20 DIAGNOSIS — J40 Bronchitis, not specified as acute or chronic: Secondary | ICD-10-CM

## 2015-11-20 MED ORDER — AMOXICILLIN 500 MG PO CAPS: 500.0000 mg | ORAL_CAPSULE | Freq: Three times a day (TID) | ORAL | Status: DC

## 2015-11-20 MED ORDER — GUAIFENESIN-CODEINE 100-10 MG/5ML PO SOLN: 5.0000 mL | Freq: Three times a day (TID) | ORAL | Status: DC | PRN

## 2015-11-20 NOTE — Progress Notes (Signed)
Name: Daniel Kirby   MRN: [id number]    DOB: 09/30/1946   Date:11/20/2015       Progress Note  Subjective  Chief Complaint  Chief Complaint  Patient presents with  . Sinusitis    dry hacking cough at night, cong    Cough This is a new problem. The current episode started in the past 7 days. The problem has been gradually worsening. The problem occurs every few minutes. The cough is non-productive. Pertinent negatives include no chest pain, chills, ear congestion, ear pain, fever, headaches, heartburn, hemoptysis, myalgias, nasal congestion, rash, rhinorrhea, sore throat, shortness of breath, weight loss or wheezing. Nothing aggravates the symptoms. He has tried nothing for the symptoms. The treatment provided mild relief. There is no history of asthma, bronchiectasis, bronchitis, COPD, emphysema, environmental allergies or pneumonia.    No problem-specific assessment & plan notes found for this encounter.   Past Medical History  Diagnosis Date  . Prostate cancer ([place])   . ED (erectile dysfunction)   . HLD (hyperlipidemia)   . Abdominal pain     RUQ  . Hypothyroid   . Hypertension   . Vertigo     1 episode only, approx 1 month ago  . Arthritis     right ankle    Past Surgical History  Procedure Laterality Date  . Robotic prostatectomy  2006  . Colonoscopy  2006    normal  . Colonoscopy with propofol N/A 09/06/2015    Procedure: COLONOSCOPY WITH PROPOFOL;  Surgeon: Lucilla Lame, MD;  Location: [place];  Service: Endoscopy;  Laterality: N/A;  . Polypectomy  09/06/2015    Procedure: POLYPECTOMY;  Surgeon: Lucilla Lame, MD;  Location: [place];  Service: Endoscopy;;    Family History  Problem Relation Age of Onset  . Diabetes    . Heart attack Father     Social History   Social History  . Marital Status: Married    Spouse Name: N/A  . Number of Children: N/A  . Years of Education: N/A   Occupational History  . Not on file.   Social History  Main Topics  . Smoking status: Former Research scientist (life sciences)  . Smokeless tobacco: Not on file     Comment: smokes a cigar every 6 months  . Alcohol Use: 0.0 oz/week    0 Standard drinks or equivalent per week     Comment: occasional, 1-2 drinks per year  . Drug Use: No  . Sexual Activity: Yes   Other Topics Concern  . Not on file   Social History Narrative    No Known Allergies   Review of Systems  Constitutional: Negative for fever, chills, weight loss and malaise/fatigue.  HENT: Negative for ear discharge, ear pain, rhinorrhea and sore throat.   Eyes: Negative for blurred vision.  Respiratory: Positive for cough. Negative for hemoptysis, sputum production, shortness of breath and wheezing.   Cardiovascular: Negative for chest pain, palpitations and leg swelling.  Gastrointestinal: Negative for heartburn, nausea, abdominal pain, diarrhea, constipation, blood in stool and melena.  Genitourinary: Negative for dysuria, urgency, frequency and hematuria.  Musculoskeletal: Negative for myalgias, back pain, joint pain and neck pain.  Skin: Negative for rash.  Neurological: Negative for dizziness, tingling, sensory change, focal weakness and headaches.  Endo/Heme/Allergies: Negative for environmental allergies and polydipsia. Does not bruise/bleed easily.  Psychiatric/Behavioral: Negative for depression and suicidal ideas. The patient is not nervous/anxious and does not have insomnia.      Objective  Filed Vitals:  11/20/15 1441  BP: 140/80  Pulse: 80  Height: 5\' 7"  (1.702 m)  Weight: 218 lb (98.884 kg)    Physical Exam  Constitutional: He is oriented to person, place, and time and well-developed, well-nourished, and in no distress.  HENT:  Head: Normocephalic.  Right Ear: External ear normal.  Left Ear: External ear normal.  Nose: Nose normal.  Mouth/Throat: Oropharynx is clear and moist.  Eyes: Conjunctivae and EOM are normal. Pupils are equal, round, and reactive to light. Right  eye exhibits no discharge. Left eye exhibits no discharge. No scleral icterus.  Neck: Normal range of motion. Neck supple. No JVD present. No tracheal deviation present. No thyromegaly present.  Cardiovascular: Normal rate, regular rhythm, normal heart sounds and intact distal pulses.  Exam reveals no gallop and no friction rub.   No murmur heard. Pulmonary/Chest: Breath sounds normal. No respiratory distress. He has no wheezes. He has no rales.  Abdominal: Soft. Bowel sounds are normal. He exhibits no mass. There is no hepatosplenomegaly. There is no tenderness. There is no rebound, no guarding and no CVA tenderness.  Musculoskeletal: Normal range of motion. He exhibits no edema or tenderness.  Lymphadenopathy:    He has no cervical adenopathy.  Neurological: He is alert and oriented to person, place, and time. He has normal sensation, normal strength, normal reflexes and intact cranial nerves. No cranial nerve deficit.  Skin: Skin is warm. No rash noted.  Psychiatric: Mood and affect normal.  Nursing note and vitals reviewed.     Assessment & Plan  Problem List Items Addressed This Visit    None    Visit Diagnoses    Bronchitis    -  Primary    Relevant Medications    amoxicillin (AMOXIL) 500 MG capsule    guaiFENesin-codeine 100-10 MG/5ML syrup         Dr. Joelle Flessner [facility]  11/20/2015

## 2015-12-09 ENCOUNTER — Ambulatory Visit (INDEPENDENT_AMBULATORY_CARE_PROVIDER_SITE_OTHER): Payer: Medicare Other | Admitting: Family Medicine

## 2015-12-09 ENCOUNTER — Encounter: Payer: Self-pay | Admitting: Family Medicine

## 2015-12-09 VITALS — BP 120/86 | HR 72 | Temp 98.3°F | Ht 67.0 in | Wt 215.0 lb

## 2015-12-09 DIAGNOSIS — A084 Viral intestinal infection, unspecified: Secondary | ICD-10-CM

## 2015-12-09 MED ORDER — PROMETHAZINE HCL 25 MG PO TABS: 25.0000 mg | ORAL_TABLET | Freq: Three times a day (TID) | ORAL | Status: DC | PRN

## 2015-12-09 NOTE — Progress Notes (Signed)
Name: Daniel Kirby   MRN: [id number]    DOB: 05/16/1946   Date:12/09/2015       Progress Note  Subjective  Chief Complaint  Chief Complaint  Patient presents with  . Dizziness    with vomitting  . Excessive Sweating    at night- wake up in a puddle of sweat    Dizziness This is a new problem. The current episode started yesterday. The problem occurs constantly. The problem has been waxing and waning. Associated symptoms include nausea, vertigo and vomiting. Pertinent negatives include no abdominal pain, anorexia, chest pain, chills, congestion, coughing, diaphoresis, fever, headaches, joint swelling, myalgias, neck pain, numbness, rash, sore throat, swollen glands, urinary symptoms, visual change or weakness. Nothing aggravates the symptoms. The treatment provided mild relief.    No problem-specific assessment & plan notes found for this encounter.   Past Medical History  Diagnosis Date  . Prostate cancer ([place])   . ED (erectile dysfunction)   . HLD (hyperlipidemia)   . Abdominal pain     RUQ  . Hypothyroid   . Hypertension   . Vertigo     1 episode only, approx 1 month ago  . Arthritis     right ankle    Past Surgical History  Procedure Laterality Date  . Robotic prostatectomy  2006  . Colonoscopy  2006    normal  . Colonoscopy with propofol N/A 09/06/2015    Procedure: COLONOSCOPY WITH PROPOFOL;  Surgeon: Lucilla Lame, MD;  Location: [place];  Service: Endoscopy;  Laterality: N/A;  . Polypectomy  09/06/2015    Procedure: POLYPECTOMY;  Surgeon: Lucilla Lame, MD;  Location: [place];  Service: Endoscopy;;    Family History  Problem Relation Age of Onset  . Diabetes    . Heart attack Father     Social History   Social History  . Marital Status: Married    Spouse Name: N/A  . Number of Children: N/A  . Years of Education: N/A   Occupational History  . Not on file.   Social History Main Topics  . Smoking status: Former Research scientist (life sciences)  .  Smokeless tobacco: Not on file     Comment: smokes a cigar every 6 months  . Alcohol Use: 0.0 oz/week    0 Standard drinks or equivalent per week     Comment: occasional, 1-2 drinks per year  . Drug Use: No  . Sexual Activity: Yes   Other Topics Concern  . Not on file   Social History Narrative    No Known Allergies   Review of Systems  Constitutional: Negative for fever, chills, weight loss, malaise/fatigue and diaphoresis.  HENT: Negative for congestion, ear discharge, ear pain and sore throat.   Eyes: Negative for blurred vision.  Respiratory: Negative for cough, sputum production, shortness of breath and wheezing.   Cardiovascular: Negative for chest pain, palpitations and leg swelling.  Gastrointestinal: Positive for nausea and vomiting. Negative for heartburn, abdominal pain, diarrhea, constipation, blood in stool, melena and anorexia.  Genitourinary: Negative for dysuria, urgency, frequency and hematuria.  Musculoskeletal: Negative for myalgias, back pain, joint pain, joint swelling and neck pain.  Skin: Negative for rash.  Neurological: Positive for dizziness and vertigo. Negative for tingling, sensory change, focal weakness, weakness, numbness and headaches.  Endo/Heme/Allergies: Negative for environmental allergies and polydipsia. Does not bruise/bleed easily.  Psychiatric/Behavioral: Negative for depression and suicidal ideas. The patient is not nervous/anxious and does not have insomnia.      Objective  Filed Vitals:   12/09/15 1048  BP: 120/86  Pulse: 72  Temp: 98.3 F (36.8 C)  TempSrc: Oral  Height: 5\' 7"  (1.702 m)  Weight: 215 lb (97.523 kg)    Physical Exam  Constitutional: He is oriented to person, place, and time and well-developed, well-nourished, and in no distress.  HENT:  Head: Normocephalic.  Right Ear: External ear normal.  Left Ear: External ear normal.  Nose: Nose normal.  Mouth/Throat: Oropharynx is clear and moist.  Eyes: Conjunctivae  and EOM are normal. Pupils are equal, round, and reactive to light. Right eye exhibits no discharge. Left eye exhibits no discharge. No scleral icterus.  Neck: Normal range of motion. Neck supple. No JVD present. No tracheal deviation present. No thyromegaly present.  Cardiovascular: Normal rate, regular rhythm, normal heart sounds and intact distal pulses.  Exam reveals no gallop and no friction rub.   No murmur heard. Pulmonary/Chest: Breath sounds normal. No respiratory distress. He has no wheezes. He has no rales.  Abdominal: Soft. Bowel sounds are normal. He exhibits no mass. There is no hepatosplenomegaly. There is no tenderness. There is no rebound, no guarding and no CVA tenderness.  Musculoskeletal: Normal range of motion. He exhibits no edema or tenderness.  Lymphadenopathy:    He has no cervical adenopathy.  Neurological: He is alert and oriented to person, place, and time. He has normal sensation, normal strength and intact cranial nerves. No cranial nerve deficit.  Skin: Skin is warm. No rash noted.  Psychiatric: Mood and affect normal.  Nursing note and vitals reviewed.     Assessment & Plan  Problem List Items Addressed This Visit    None    Visit Diagnoses    Viral gastroenteritis    -  Primary    Relevant Medications    promethazine (PHENERGAN) 25 MG tablet         Dr. [facility]  12/09/2015

## 2015-12-09 NOTE — Patient Instructions (Signed)

## 2016-01-08 ENCOUNTER — Other Ambulatory Visit: Payer: Self-pay | Admitting: Family Medicine

## 2016-02-27 ENCOUNTER — Ambulatory Visit (INDEPENDENT_AMBULATORY_CARE_PROVIDER_SITE_OTHER): Payer: Medicare Other | Admitting: Family Medicine

## 2016-02-27 ENCOUNTER — Encounter: Payer: Self-pay | Admitting: Family Medicine

## 2016-02-27 VITALS — BP 104/60 | HR 70 | Ht 67.0 in | Wt 219.0 lb

## 2016-02-27 DIAGNOSIS — M1732 Unilateral post-traumatic osteoarthritis, left knee: Secondary | ICD-10-CM

## 2016-02-27 MED ORDER — PREDNISONE 10 MG PO TABS: ORAL_TABLET | ORAL | Status: DC

## 2016-02-27 NOTE — Progress Notes (Signed)
Name: Daniel Kirby   MRN: [id number]    DOB: Jul 13, 1946   Date:02/27/2016       Progress Note  Subjective  Chief Complaint  Chief Complaint  Patient presents with  . Joint Swelling    L) knee swelling- feels "puffy"    Leg Pain  The incident occurred more than 1 week ago. Incident location: daughter house. The injury mechanism was a twisting injury (repetitive activity). The pain is present in the left knee. The quality of the pain is described as aching. The pain is at a severity of 1/10. The pain is mild. The pain has been fluctuating since onset. Pertinent negatives include no inability to bear weight, loss of motion, muscle weakness or tingling. The symptoms are aggravated by movement. He has tried NSAIDs for the symptoms. The treatment provided no relief.    No problem-specific assessment & plan notes found for this encounter.   Past Medical History  Diagnosis Date  . Prostate cancer ([place])   . ED (erectile dysfunction)   . HLD (hyperlipidemia)   . Abdominal pain     RUQ  . Hypothyroid   . Hypertension   . Vertigo     1 episode only, approx 1 month ago  . Arthritis     right ankle    Past Surgical History  Procedure Laterality Date  . Robotic prostatectomy  2006  . Colonoscopy  2006    normal  . Colonoscopy with propofol N/A 09/06/2015    Procedure: COLONOSCOPY WITH PROPOFOL;  Surgeon: Lucilla Lame, MD;  Location: [facility];  Service: Endoscopy;  Laterality: N/A;  . Polypectomy  09/06/2015    Procedure: POLYPECTOMY;  Surgeon: Lucilla Lame, MD;  Location: [facility];  Service: Endoscopy;;    Family History  Problem Relation Age of Onset  . Diabetes    . Heart attack Father     Social History   Social History  . Marital Status: Married    Spouse Name: N/A  . Number of Children: N/A  . Years of Education: N/A   Occupational History  . Not on file.   Social History Main Topics  . Smoking status: Former Research scientist (life sciences)  . Smokeless tobacco: Not  on file     Comment: smokes a cigar every 6 months  . Alcohol Use: 0.0 oz/week    0 Standard drinks or equivalent per week     Comment: occasional, 1-2 drinks per year  . Drug Use: No  . Sexual Activity: Yes   Other Topics Concern  . Not on file   Social History Narrative    No Known Allergies   Review of Systems  Constitutional: Negative for fever, chills, weight loss and malaise/fatigue.  HENT: Negative for ear discharge, ear pain and sore throat.   Eyes: Negative for blurred vision.  Respiratory: Negative for cough, sputum production, shortness of breath and wheezing.   Cardiovascular: Negative for chest pain, palpitations and leg swelling.  Gastrointestinal: Negative for heartburn, nausea, abdominal pain, diarrhea, constipation, blood in stool and melena.  Genitourinary: Negative for dysuria, urgency, frequency and hematuria.  Musculoskeletal: Positive for joint pain. Negative for myalgias, back pain and neck pain.  Skin: Negative for rash.  Neurological: Negative for dizziness, tingling, sensory change, focal weakness and headaches.  Endo/Heme/Allergies: Negative for environmental allergies and polydipsia. Does not bruise/bleed easily.  Psychiatric/Behavioral: Negative for depression and suicidal ideas. The patient is not nervous/anxious and does not have insomnia.      Objective  Filed  Vitals:   02/27/16 1445  BP: 104/60  Pulse: 70  Height: 5\' 7"  (1.702 m)  Weight: 219 lb (99.338 kg)    Physical Exam  Constitutional: He is oriented to person, place, and time and well-developed, well-nourished, and in no distress.  HENT:  Head: Normocephalic.  Right Ear: External ear normal.  Left Ear: External ear normal.  Nose: Nose normal.  Mouth/Throat: Oropharynx is clear and moist.  Eyes: Conjunctivae and EOM are normal. Pupils are equal, round, and reactive to light. Right eye exhibits no discharge. Left eye exhibits no discharge. No scleral icterus.  Neck: Normal range  of motion. Neck supple. No JVD present. No tracheal deviation present. No thyromegaly present.  Cardiovascular: Normal rate, regular rhythm, normal heart sounds and intact distal pulses.  Exam reveals no gallop and no friction rub.   No murmur heard. Pulmonary/Chest: Breath sounds normal. No respiratory distress. He has no wheezes. He has no rales.  Abdominal: Soft. Bowel sounds are normal. He exhibits no mass. There is no hepatosplenomegaly. There is no tenderness. There is no rebound, no guarding and no CVA tenderness.  Musculoskeletal: Normal range of motion. He exhibits tenderness. He exhibits no edema.       Left knee: Tenderness found. Medial joint line tenderness noted. No MCL, no LCL and no patellar tendon tenderness noted.  Lymphadenopathy:    He has no cervical adenopathy.  Neurological: He is alert and oriented to person, place, and time. He has normal sensation, normal strength and intact cranial nerves. No cranial nerve deficit.  Skin: Skin is warm. No rash noted.  Psychiatric: Mood and affect normal.  Nursing note and vitals reviewed.     Assessment & Plan  Problem List Items Addressed This Visit    None    Visit Diagnoses    Post-traumatic osteoarthritis of left knee    -  Primary    Relevant Medications    predniSONE (DELTASONE) 10 MG tablet         Dr. Deanna Jones [facility]  02/27/2016

## 2016-03-02 ENCOUNTER — Other Ambulatory Visit: Payer: Self-pay

## 2016-03-20 ENCOUNTER — Other Ambulatory Visit: Payer: Self-pay | Admitting: Family Medicine

## 2016-04-10 DIAGNOSIS — H40003 Preglaucoma, unspecified, bilateral: Secondary | ICD-10-CM | POA: Diagnosis not present

## 2016-04-14 ENCOUNTER — Other Ambulatory Visit: Payer: Self-pay

## 2016-04-15 ENCOUNTER — Encounter: Payer: Self-pay | Admitting: Family Medicine

## 2016-04-15 ENCOUNTER — Ambulatory Visit (INDEPENDENT_AMBULATORY_CARE_PROVIDER_SITE_OTHER): Payer: Medicare Other | Admitting: Family Medicine

## 2016-04-15 VITALS — BP 130/80 | HR 80 | Temp 97.8°F | Ht 67.0 in | Wt 222.0 lb

## 2016-04-15 DIAGNOSIS — J40 Bronchitis, not specified as acute or chronic: Secondary | ICD-10-CM

## 2016-04-15 MED ORDER — AMOXICILLIN-POT CLAVULANATE 875-125 MG PO TABS: 1.0000 | ORAL_TABLET | Freq: Two times a day (BID) | ORAL | Status: DC

## 2016-04-15 MED ORDER — GUAIFENESIN-CODEINE 100-10 MG/5ML PO SYRP: 5.0000 mL | ORAL_SOLUTION | Freq: Three times a day (TID) | ORAL | Status: DC | PRN

## 2016-04-15 NOTE — Progress Notes (Signed)
Name: Daniel Kirby   MRN: [id number]    DOB: [birth year]   Date:04/15/2016       Progress Note  Subjective  Chief Complaint  Chief Complaint  Patient presents with  . Sinusitis    cough and cong x 1 week    Sinusitis This is a new problem. The current episode started 1 to 4 weeks ago. The problem has been gradually worsening since onset. There has been no fever. He is experiencing no pain. Associated symptoms include coughing and sneezing. Pertinent negatives include no chills, congestion, diaphoresis, ear pain, headaches, hoarse voice, neck pain, shortness of breath, sinus pressure, sore throat or swollen glands. Past treatments include oral decongestants and acetaminophen. The treatment provided no relief.  Cough This is a new problem. The problem has been gradually worsening. The problem occurs every few minutes. The cough is non-productive. Pertinent negatives include no chest pain, chills, ear pain, fever, headaches, heartburn, hemoptysis, myalgias, nasal congestion, postnasal drip, rash, sore throat, shortness of breath, weight loss or wheezing. He has tried OTC cough suppressant for the symptoms. The treatment provided no relief. There is no history of environmental allergies.    No problem-specific assessment & plan notes found for this encounter.   Past Medical History  Diagnosis Date  . Prostate cancer ([place])   . ED (erectile dysfunction)   . HLD (hyperlipidemia)   . Abdominal pain     RUQ  . Hypothyroid   . Hypertension   . Vertigo     1 episode only, approx 1 month ago  . Arthritis     right ankle    Past Surgical History  Procedure Laterality Date  . Robotic prostatectomy  2006  . Colonoscopy  2006    normal  . Colonoscopy with propofol N/A 09/06/2015    Procedure: COLONOSCOPY WITH PROPOFOL;  Surgeon: Lucilla Lame, MD;  Location: [facility];  Service: Endoscopy;  Laterality: N/A;  . Polypectomy  09/06/2015    Procedure: POLYPECTOMY;  Surgeon: Lucilla Lame, MD;  Location: [facility];  Service: Endoscopy;;    Family History  Problem Relation Age of Onset  . Diabetes    . Heart attack Father     Social History   Social History  . Marital Status: Married    Spouse Name: N/A  . Number of Children: N/A  . Years of Education: N/A   Occupational History  . Not on file.   Social History Main Topics  . Smoking status: Former Research scientist (life sciences)  . Smokeless tobacco: Not on file     Comment: smokes a cigar every 6 months  . Alcohol Use: 0.0 oz/week    0 Standard drinks or equivalent per week     Comment: occasional, 1-2 drinks per year  . Drug Use: No  . Sexual Activity: Yes   Other Topics Concern  . Not on file   Social History Narrative    No Known Allergies   Review of Systems  Constitutional: Negative for fever, chills, weight loss, malaise/fatigue and diaphoresis.  HENT: Positive for sneezing. Negative for congestion, ear discharge, ear pain, hoarse voice, postnasal drip, sinus pressure and sore throat.   Eyes: Negative for blurred vision.  Respiratory: Positive for cough. Negative for hemoptysis, sputum production, shortness of breath and wheezing.   Cardiovascular: Negative for chest pain, palpitations and leg swelling.  Gastrointestinal: Negative for heartburn, nausea, abdominal pain, diarrhea, constipation, blood in stool and melena.  Genitourinary: Negative for dysuria, urgency, frequency and hematuria.  Musculoskeletal: Negative for myalgias, back pain, joint pain and neck pain.  Skin: Negative for rash.  Neurological: Negative for dizziness, tingling, sensory change, focal weakness and headaches.  Endo/Heme/Allergies: Negative for environmental allergies and polydipsia. Does not bruise/bleed easily.  Psychiatric/Behavioral: Negative for depression and suicidal ideas. The patient is not nervous/anxious and does not have insomnia.      Objective  Filed Vitals:   04/15/16 0803  BP: 130/80  Pulse: 80  Temp:  97.8 F (36.6 C)  TempSrc: Oral  Height: 5\' 7"  (1.702 m)  Weight: 222 lb (100.699 kg)    Physical Exam  Constitutional: He is oriented to person, place, and time and well-developed, well-nourished, and in no distress.  HENT:  Head: Normocephalic.  Right Ear: External ear normal.  Left Ear: External ear normal.  Nose: Nose normal.  Mouth/Throat: Oropharynx is clear and moist.  Eyes: Conjunctivae and EOM are normal. Pupils are equal, round, and reactive to light. Right eye exhibits no discharge. Left eye exhibits no discharge. No scleral icterus.  Neck: Normal range of motion. Neck supple. No JVD present. No tracheal deviation present. No thyromegaly present.  Cardiovascular: Normal rate, regular rhythm, normal heart sounds and intact distal pulses.  Exam reveals no gallop and no friction rub.   No murmur heard. Pulmonary/Chest: Breath sounds normal. No respiratory distress. He has no wheezes. He has no rales.  Abdominal: Soft. Bowel sounds are normal. He exhibits no mass. There is no hepatosplenomegaly. There is no tenderness. There is no rebound, no guarding and no CVA tenderness.  Musculoskeletal: Normal range of motion. He exhibits no edema or tenderness.  Lymphadenopathy:    He has no cervical adenopathy.  Neurological: He is alert and oriented to person, place, and time. He has normal sensation, normal strength, normal reflexes and intact cranial nerves. No cranial nerve deficit.  Skin: Skin is warm. No rash noted.  Psychiatric: Mood and affect normal.  Nursing note and vitals reviewed.     Assessment & Plan  Problem List Items Addressed This Visit    None    Visit Diagnoses    Bronchitis    -  Primary    Relevant Medications    amoxicillin-clavulanate (AUGMENTIN) 875-125 MG tablet    guaiFENesin-codeine (ROBITUSSIN AC) 100-10 MG/5ML syrup         Dr. [facility]  04/15/2016

## 2016-04-28 ENCOUNTER — Other Ambulatory Visit: Payer: Self-pay

## 2016-04-28 MED ORDER — BENZONATATE 100 MG PO CAPS: 100.0000 mg | ORAL_CAPSULE | Freq: Two times a day (BID) | ORAL | Status: DC | PRN

## 2016-04-29 ENCOUNTER — Ambulatory Visit (INDEPENDENT_AMBULATORY_CARE_PROVIDER_SITE_OTHER): Payer: Medicare Other | Admitting: Urology

## 2016-04-29 ENCOUNTER — Telehealth: Payer: Self-pay | Admitting: Urology

## 2016-04-29 ENCOUNTER — Encounter: Payer: Self-pay | Admitting: Urology

## 2016-04-29 VITALS — BP 118/81 | HR 85 | Ht 67.0 in | Wt 223.8 lb

## 2016-04-29 DIAGNOSIS — Z8546 Personal history of malignant neoplasm of prostate: Secondary | ICD-10-CM

## 2016-04-29 DIAGNOSIS — N528 Other male erectile dysfunction: Secondary | ICD-10-CM | POA: Diagnosis not present

## 2016-04-29 DIAGNOSIS — N529 Male erectile dysfunction, unspecified: Secondary | ICD-10-CM

## 2016-04-29 NOTE — Progress Notes (Signed)
8:42 PM   JAELAN FORYS [birth year] [id number]  Referring provider: Juline Patch, MD [address] [place], [place] [address]  Chief Complaint  Patient presents with  . Prostate Cancer    1 year follow up     HPI: Patient is a 70 year old African-American male with a history of prostate cancer who is status post robotic prostatectomy in 2006 with adjunctive radiation for positive surgical margins and erectile dysfunction who presents today for a 1 year follow-up.  History of prostate cancer PCa History:  He underwent prostate biopsy on 05/25/05 for a PSA of 8.2 ng/mL, which showed bilateral prostatic adenocarcinoma, Gleason 3 + 4, in the left apex, right mid gland, and right base, 3/6 cores positive. The patient was sent to [facility] for further evaluation and care.  The patient was evaluated by Dr. Lyn Henri. He underwent laparoscopic robotic radical prostatectomy and right pelvic lymph node dissection on 08/26/05. Pathology showed bilateral prostatic adenocarcinoma, Gleason 3 + 4, involving 20% of the gland, no extracapsular extension, no seminal vesical invasion, positive extensive perineural invasion, no vascular invasion, positive right peripheral surgical margins, and no regional lymph nodes. The patient's postoperative course has been uncomplicated. However, given the positive margins, the patient now presents to our clinic for a discussion of further treatment options. The patient requests we proceed with radiation treatment. He then received adjuvant radiation therapy which he then completed in 01/2006. Since that point in time, his PSA's have been undetectable. His I PSS score is 2/1.      IPSS      04/29/16 1400       International Prostate Symptom Score   How often have you had the sensation of not emptying your bladder? Not at All     How often have you had to urinate less than every two hours? Less than 1 in 5 times     How often have you  found you stopped and started again several times when you urinated? Not at All     How often have you found it difficult to postpone urination? Not at All     How often have you had a weak urinary stream? Not at All     How often have you had to strain to start urination? Not at All     How many times did you typically get up at night to urinate? 1 Time     Total IPSS Score 2     Quality of Life due to urinary symptoms   If you were to spend the rest of your life with your urinary condition just the way it is now how would you feel about that? Pleased        Score:  1-7 Mild 8-19 Moderate 20-35 Severe  Erectile dysfunction Patient also suffers from erectile dysfunction his SH IM score is 5, which is severe erectile dysfunction function. He does admit to nocturnal tumescence, but he is unable to achieve or maintain an erection satisfactory enough for intercourse.  He has tried PDE 5 inhibitors in the past and Trimix injections with limited success. When I saw him at  previous visit we briefly discussed penile prosthesis placement and I have given him a DVD. He did have the opportunity to watch the video, but he does not want to pursue surgical intervention at this time.      SHIM      04/29/16 1446  SHIM: Over the last 6 months:   How do you rate your confidence that you could get and keep an erection? Very Low     When you had erections with sexual stimulation, how often were your erections hard enough for penetration (entering your partner)? Almost Never or Never     During sexual intercourse, how often were you able to maintain your erection after you had penetrated (entered) your partner? Extremely Difficult     During sexual intercourse, how difficult was it to maintain your erection to completion of intercourse? Extremely Difficult     When you attempted sexual intercourse, how often was it satisfactory for you? Extremely Difficult     SHIM Total Score   SHIM 5         Score: 1-7 Severe ED 8-11 Moderate ED 12-16 Mild-Moderate ED 17-21 Mild ED 22-25 No ED  PMH: Past Medical History  Diagnosis Date  . Prostate cancer ([place])   . ED (erectile dysfunction)   . HLD (hyperlipidemia)   . Abdominal pain     RUQ  . Hypothyroid   . Hypertension   . Vertigo     1 episode only, approx 1 month ago  . Arthritis     right ankle    Surgical History: Past Surgical History  Procedure Laterality Date  . Robotic prostatectomy  2006  . Colonoscopy  2006    normal  . Colonoscopy with propofol N/A 09/06/2015    Procedure: COLONOSCOPY WITH PROPOFOL;  Surgeon: Lucilla Lame, MD;  Location: [facility];  Service: Endoscopy;  Laterality: N/A;  . Polypectomy  09/06/2015    Procedure: POLYPECTOMY;  Surgeon: Lucilla Lame, MD;  Location: [facility];  Service: Endoscopy;;    Home Medications:    Medication List       This list is accurate as of: 04/29/16 11:59 PM.  Always use your most recent med list.               amoxicillin-clavulanate 875-125 MG tablet  Commonly known as:  AUGMENTIN  Take 1 tablet by mouth 2 (two) times daily.     benazepril-hydrochlorthiazide 10-12.5 MG tablet  Commonly known as:  LOTENSIN HCT  TAKE 1 TABLET EVERY DAY     benzonatate 100 MG capsule  Commonly known as:  TESSALON  Take 1 capsule (100 mg total) by mouth 2 (two) times daily as needed for cough.     guaiFENesin-codeine 100-10 MG/5ML syrup  Commonly known as:  ROBITUSSIN AC  Take 5 mLs by mouth 3 (three) times daily as needed for cough.     levothyroxine 100 MCG tablet  Commonly known as:  SYNTHROID, LEVOTHROID  TAKE 1 TABLET EVERY DAY     meloxicam 7.5 MG tablet  Commonly known as:  MOBIC  TAKE 1 TABLET EVERY DAY     pravastatin 10 MG tablet  Commonly known as:  PRAVACHOL  TAKE 1 TABLET EVERY DAY        Allergies: No Known Allergies  Family History: Family History  Problem Relation Age of Onset  . Diabetes    . Heart attack  Father   . Kidney disease Neg Hx   . Prostate cancer Neg Hx     Social History:  reports that he has quit smoking. He does not have any smokeless tobacco history on file. He reports that he drinks alcohol. He reports that he does not use illicit drugs.  ROS: UROLOGY Frequent Urination?: No Hard to postpone urination?: No  Burning/pain with urination?: No Get up at night to urinate?: Yes Leakage of urine?: No Urine stream starts and stops?: No Trouble starting stream?: No Do you have to strain to urinate?: No Blood in urine?: No Urinary tract infection?: No Sexually transmitted disease?: No Injury to kidneys or bladder?: No Painful intercourse?: No Weak stream?: No Erection problems?: Yes Penile pain?: No  Gastrointestinal Nausea?: No Vomiting?: No Indigestion/heartburn?: No Diarrhea?: No Constipation?: No  Constitutional Fever: No Night sweats?: No Weight loss?: No Fatigue?: No  Skin Skin rash/lesions?: No Itching?: No  Eyes Blurred vision?: No Double vision?: No  Ears/Nose/Throat Sore throat?: No Sinus problems?: No  Hematologic/Lymphatic Swollen glands?: No Easy bruising?: No  Cardiovascular Leg swelling?: No Chest pain?: No  Respiratory Cough?: No Shortness of breath?: No  Endocrine Excessive thirst?: No  Musculoskeletal Back pain?: No Joint pain?: No  Neurological Headaches?: No Dizziness?: No  Psychologic Depression?: No Anxiety?: No  Physical Exam: BP 118/81 mmHg  Pulse 85  Ht 5\' 7"  (1.702 m)  Wt 223 lb 12.8 oz (101.515 kg)  BMI 35.04 kg/m2  GU: Patient with circumcised phallus.  Urethral meatus is patent.  No penile discharge. No penile lesions or rashes. Scrotum without lesions, cysts, rashes and/or edema.  Testicles are located scrotally bilaterally. No masses are appreciated in the testicles. Left and right epididymis are normal. Rectal: Deferred.    Laboratory Data:  Lab Results  Component Value Date   CREATININE  0.91 07/24/2015   PSA history  Less than 0.1 ng/mL on 06/04/2015    Lab Results  Component Value Date   HGBA1C 5.7* 07/24/2015      Assessment & Plan:    1. Erectile dysfunction:  SHIM score is 5.  Patient has suffered with erectile dysfunction since his robotic prostatectomy in 2006. He has tried PDE 5 inhibitors and Tri-mix injections with limited success. He did review the DVD for penile prosthesis, but he is not interested in surgical intervention at this time. He would like to re-try the Trimix injections.  We will call in a refill and he will schedule an appointment.  1. History of prostate cancer:  3+4=7, pT2c NXMX adenocarcinoma of the prostate, status post radical prostatectomy 08/26/05  Gleason 3+4=7 prostate cancer.  - (08/2005) robotic prostatectomy - Gleason 3+4=7, pT2c NXMX  - (08/2005-01/2006) adjuvant radiation therapy - PSA undetectable  - PSA   Return in about 1 year (around 04/29/2017) for IPSS, exam and PSA.  Zara Council, [facility] [address] [place], [place] [address] [phone]

## 2016-04-29 NOTE — Telephone Encounter (Signed)
He would like to try TriMix injections.  Please calll a script into [facility]. He will schedule a TriMix injection appointment in the future.

## 2016-04-30 ENCOUNTER — Telehealth: Payer: Self-pay

## 2016-04-30 LAB — PSA

## 2016-04-30 NOTE — Telephone Encounter (Signed)
LMOM- most recent labs are stable.  

## 2016-04-30 NOTE — Telephone Encounter (Signed)
-----   Message from Nori Riis, PA-C sent at 04/30/2016  8:41 AM EDT ----- Patient's PSA is stable.  We will see him in one year.

## 2016-05-01 NOTE — Telephone Encounter (Signed)
Trimix called into [facility].

## 2016-05-15 ENCOUNTER — Other Ambulatory Visit: Payer: Self-pay

## 2016-05-21 ENCOUNTER — Telehealth: Payer: Self-pay | Admitting: *Deleted

## 2016-05-21 NOTE — Telephone Encounter (Signed)
Spoke with patient after Minus Liberty  took a call from patient about patient wanting to give himself the Trimix injection because first available appointment was July 31st. Let patient know that per Larene Beach he could not give himself the injection, that he needed to be in the office setting so he can be shown and trained on how to give the injections and be observed for allergies or complications on first administration. Patient ok with plan and transferred to front for Olivia Mackie to book his appointment.

## 2016-05-26 ENCOUNTER — Encounter: Payer: Self-pay | Admitting: Family Medicine

## 2016-05-26 ENCOUNTER — Ambulatory Visit (INDEPENDENT_AMBULATORY_CARE_PROVIDER_SITE_OTHER): Payer: Medicare Other | Admitting: Family Medicine

## 2016-05-26 VITALS — BP 124/80 | HR 60 | Ht 67.0 in | Wt 220.0 lb

## 2016-05-26 DIAGNOSIS — L309 Dermatitis, unspecified: Secondary | ICD-10-CM

## 2016-05-26 MED ORDER — BENAZEPRIL HCL 10 MG PO TABS: 10.0000 mg | ORAL_TABLET | Freq: Every day | ORAL | Status: DC

## 2016-05-26 MED ORDER — PREDNISONE 10 MG PO TABS: 10.0000 mg | ORAL_TABLET | Freq: Every day | ORAL | Status: DC

## 2016-05-26 NOTE — Patient Instructions (Addendum)
Urea skin cream, gel, lotion, ointment, or nail lacquer What is this medicine? UREA (yoo REE uh) is used to soften thick, rough, or dry skin caused by certain skin conditions. It is also used to soften and remove damaged or diseased nails without surgery. This medicine may be used for other purposes; ask your health care provider or pharmacist if you have questions. What should I tell my health care provider before I take this medicine? They need to know if you have any of these conditions: -broken, inflamed, or burnt skin -infection -an unusual or allergic reaction to urea, other medicines, foods, dyes, or preservatives -pregnant or trying to get pregnant -breast-feeding How should I use this medicine? This medicine is for external use only. Do not take by mouth. Follow the directions on the label. Apply a thin film to the affected area. The moisturizing effect may be better if this medicine is applied while the skin is still damp after washing or bathing. If applying to the nails, cover to protect the surrounding area. Apply generously to the affected nail. Let it dry uncovered or cover with an adhesive bandage or gauze secured with tape. The treated nail can be removed after several days. On exposure to air the nail bed hardens within 12 to 36 hours. Apply with caution to the face or on broken skin. Do not get this medicine in or near the eyes, lips or other areas of sensitive skin. Talk to your pediatrician regarding the use of this medicine in children. Special care may be needed. Overdosage: If you think you have taken too much of this medicine contact a poison control center or emergency room at once. NOTE: This medicine is only for you. Do not share this medicine with others. What if I miss a dose? If you miss a dose, use it as soon as you can. If it is almost time for your next dose, use only that dose. Do not use double or extra doses. What may interact with this medicine? Interactions  are not expected. Do not use any other skin products on the affected area without telling your doctor or health care professional. This list may not describe all possible interactions. Give your health care provider a list of all the medicines, herbs, non-prescription drugs, or dietary supplements you use. Also tell them if you smoke, drink alcohol, or use illegal drugs. Some items may interact with your medicine. What should I watch for while using this medicine? Tell your doctor or health care professional if your symptoms do not improve. What side effects may I notice from receiving this medicine? Side effects that you should report to your doctor or health care professional as soon as possible: -redness or irritation that does not go away Side effects that usually do not require medical attention (report to your doctor or health care professional if they continue or are bothersome): -skin rash -stinging, or irritation This list may not describe all possible side effects. Call your doctor for medical advice about side effects. You may report side effects to FDA at 1-800-FDA-1088. Where should I keep my medicine? Keep out of the reach of children. Store at room temperature between 15 and 30 degrees C (59 and 86 degrees F). Keep in a well closed container. Throw away any unused medicine after the expiration date. NOTE: This sheet is a summary. It may not cover all possible information. If you have questions about this medicine, talk to your doctor, pharmacist, or health care provider.  2016, Elsevier/Gold Standard. (2008-07-30 16:44:58) Eczema Eczema, also called atopic dermatitis, is a skin disorder that causes inflammation of the skin. It causes a red rash and dry, scaly skin. The skin becomes very itchy. Eczema is generally worse during the cooler winter months and often improves with the warmth of summer. Eczema usually starts showing signs in infancy. Some children outgrow eczema, but it  may last through adulthood.  CAUSES  The exact cause of eczema is not known, but it appears to run in families. People with eczema often have a family history of eczema, allergies, asthma, or hay fever. Eczema is not contagious. Flare-ups of the condition may be caused by:   Contact with something you are sensitive or allergic to.   Stress. SIGNS AND SYMPTOMS  Dry, scaly skin.   Red, itchy rash.   Itchiness. This may occur before the skin rash and may be very intense.  DIAGNOSIS  The diagnosis of eczema is usually made based on symptoms and medical history. TREATMENT  Eczema cannot be cured, but symptoms usually can be controlled with treatment and other strategies. A treatment plan might include:  Controlling the itching and scratching.   Use over-the-counter antihistamines as directed for itching. This is especially useful at night when the itching tends to be worse.   Use over-the-counter steroid creams as directed for itching.   Avoid scratching. Scratching makes the rash and itching worse. It may also result in a skin infection (impetigo) due to a break in the skin caused by scratching.   Keeping the skin well moisturized with creams every day. This will seal in moisture and help prevent dryness. Lotions that contain alcohol and water should be avoided because they can dry the skin.   Limiting exposure to things that you are sensitive or allergic to (allergens).   Recognizing situations that cause stress.   Developing a plan to manage stress.  HOME CARE INSTRUCTIONS   Only take over-the-counter or prescription medicines as directed by your health care provider.   Do not use anything on the skin without checking with your health care provider.   Keep baths or showers short (5 minutes) in warm (not hot) water. Use mild cleansers for bathing. These should be unscented. You may add nonperfumed bath oil to the bath water. It is best to avoid soap and bubble  bath.   Immediately after a bath or shower, when the skin is still damp, apply a moisturizing ointment to the entire body. This ointment should be a petroleum ointment. This will seal in moisture and help prevent dryness. The thicker the ointment, the better. These should be unscented.   Keep fingernails cut short. Children with eczema may need to wear soft gloves or mittens at night after applying an ointment.   Dress in clothes made of cotton or cotton blends. Dress lightly, because heat increases itching.   A child with eczema should stay away from anyone with fever blisters or cold sores. The virus that causes fever blisters (herpes simplex) can cause a serious skin infection in children with eczema. SEEK MEDICAL CARE IF:   Your itching interferes with sleep.   Your rash gets worse or is not better within 1 week after starting treatment.   You see pus or soft yellow scabs in the rash area.   You have a fever.   You have a rash flare-up after contact with someone who has fever blisters.    This information is not intended to replace advice given  to you by your health care provider. Make sure you discuss any questions you have with your health care provider.   Document Released: 10/30/2000 Document Revised: 08/23/2013 Document Reviewed: 06/05/2013 Elsevier Interactive Patient Education [employer].

## 2016-05-26 NOTE — Progress Notes (Signed)
Name: Daniel Kirby   MRN: [id number]    DOB: March 07, 1946   Date:05/26/2016       Progress Note  Subjective  Chief Complaint  Chief Complaint  Patient presents with  . Rash    around backside of R) ear- but itching "all over body"    Rash This is a new problem. The current episode started in the past 7 days. The problem has been gradually worsening since onset. The rash is diffuse. The rash is characterized by itchiness. He was exposed to nothing. Pertinent negatives include no anorexia, congestion, cough, diarrhea, eye pain, facial edema, fatigue, fever, joint pain, nail changes, rhinorrhea, shortness of breath, sore throat or vomiting. Past treatments include nothing. The treatment provided no relief.    No problem-specific assessment & plan notes found for this encounter.   Past Medical History  Diagnosis Date  . Prostate cancer ([place])   . ED (erectile dysfunction)   . HLD (hyperlipidemia)   . Abdominal pain     RUQ  . Hypothyroid   . Hypertension   . Vertigo     1 episode only, approx 1 month ago  . Arthritis     right ankle    Past Surgical History  Procedure Laterality Date  . Robotic prostatectomy  2006  . Colonoscopy  2006    normal  . Colonoscopy with propofol N/A 09/06/2015    Procedure: COLONOSCOPY WITH PROPOFOL;  Surgeon: Lucilla Lame, MD;  Location: [facility];  Service: Endoscopy;  Laterality: N/A;  . Polypectomy  09/06/2015    Procedure: POLYPECTOMY;  Surgeon: Lucilla Lame, MD;  Location: [facility];  Service: Endoscopy;;    Family History  Problem Relation Age of Onset  . Diabetes    . Heart attack Father   . Kidney disease Neg Hx   . Prostate cancer Neg Hx     Social History   Social History  . Marital Status: Married    Spouse Name: N/A  . Number of Children: N/A  . Years of Education: N/A   Occupational History  . Not on file.   Social History Main Topics  . Smoking status: Former Research scientist (life sciences)  . Smokeless tobacco: Not on  file     Comment: smokes a cigar every 6 months  . Alcohol Use: 0.0 oz/week    0 Standard drinks or equivalent per week     Comment: occasional, 1-2 drinks per year  . Drug Use: No  . Sexual Activity: Yes   Other Topics Concern  . Not on file   Social History Narrative    No Known Allergies   Review of Systems  Constitutional: Negative for fever, chills, weight loss, malaise/fatigue and fatigue.  HENT: Negative for congestion, ear discharge, ear pain, rhinorrhea and sore throat.   Eyes: Negative for blurred vision and pain.  Respiratory: Negative for cough, sputum production, shortness of breath and wheezing.   Cardiovascular: Negative for chest pain, palpitations and leg swelling.  Gastrointestinal: Negative for heartburn, nausea, vomiting, abdominal pain, diarrhea, constipation, blood in stool, melena and anorexia.  Genitourinary: Negative for dysuria, urgency, frequency and hematuria.  Musculoskeletal: Negative for myalgias, back pain, joint pain and neck pain.  Skin: Positive for rash. Negative for nail changes.  Neurological: Negative for dizziness, tingling, sensory change, focal weakness and headaches.  Endo/Heme/Allergies: Negative for environmental allergies and polydipsia. Does not bruise/bleed easily.  Psychiatric/Behavioral: Negative for depression and suicidal ideas. The patient is not nervous/anxious and does not have insomnia.  Objective  Filed Vitals:   05/26/16 0923  BP: 124/80  Pulse: 60  Height: 5\' 7"  (1.702 m)  Weight: 220 lb (99.791 kg)    Physical Exam  Constitutional: He is oriented to person, place, and time and well-developed, well-nourished, and in no distress.  HENT:  Head: Normocephalic.  Right Ear: External ear normal.  Left Ear: External ear normal.  Nose: Nose normal.  Mouth/Throat: Oropharynx is clear and moist.  Eyes: Conjunctivae and EOM are normal. Pupils are equal, round, and reactive to light. Right eye exhibits no discharge.  Left eye exhibits no discharge. No scleral icterus.  Neck: Normal range of motion. Neck supple. No JVD present. No tracheal deviation present. No thyromegaly present.  Cardiovascular: Normal rate, regular rhythm, normal heart sounds and intact distal pulses.  Exam reveals no gallop and no friction rub.   No murmur heard. Pulmonary/Chest: Breath sounds normal. No respiratory distress. He has no wheezes. He has no rales.  Abdominal: Soft. Bowel sounds are normal. He exhibits no mass. There is no hepatosplenomegaly. There is no tenderness. There is no rebound, no guarding and no CVA tenderness.  Musculoskeletal: Normal range of motion. He exhibits no edema or tenderness.  Lymphadenopathy:    He has no cervical adenopathy.  Neurological: He is alert and oriented to person, place, and time. He has normal sensation, normal strength, normal reflexes and intact cranial nerves. No cranial nerve deficit.  Skin: Skin is warm and dry. No rash noted.  Psychiatric: Mood and affect normal.  Nursing note and vitals reviewed.     Assessment & Plan  Problem List Items Addressed This Visit    None    Visit Diagnoses    Eczema    -  Primary    zytec AM/ benedryl 25mg  PM/ hydration lotion discussed    Relevant Medications    predniSONE (DELTASONE) 10 MG tablet    benazepril (LOTENSIN) 10 MG tablet         Dr. Deanna Jones [facility]  05/26/2016

## 2016-05-28 ENCOUNTER — Other Ambulatory Visit: Payer: Self-pay

## 2016-05-28 MED ORDER — ALPRAZOLAM 0.25 MG PO TABS: 0.2500 mg | ORAL_TABLET | Freq: Three times a day (TID) | ORAL | Status: DC | PRN

## 2016-06-11 ENCOUNTER — Other Ambulatory Visit: Payer: Self-pay

## 2016-06-15 ENCOUNTER — Ambulatory Visit (INDEPENDENT_AMBULATORY_CARE_PROVIDER_SITE_OTHER): Payer: Medicare Other | Admitting: Urology

## 2016-06-15 ENCOUNTER — Encounter: Payer: Self-pay | Admitting: Urology

## 2016-06-15 VITALS — BP 147/87 | HR 94 | Ht 68.0 in | Wt 280.6 lb

## 2016-06-15 DIAGNOSIS — N529 Male erectile dysfunction, unspecified: Secondary | ICD-10-CM

## 2016-06-15 DIAGNOSIS — N528 Other male erectile dysfunction: Secondary | ICD-10-CM

## 2016-06-15 NOTE — Progress Notes (Signed)
Patient's right corpus cavernosum is identified.  An area near the base of the penis is cleansed with rubbing alcohol.  Careful to avoid the dorsal vein, 5 mcg of Trimix is injected at a 90 degree angle into the right corpus cavernosum near the base of the penis.  Patient experienced a very firm erection in 15 minutes.

## 2016-06-16 ENCOUNTER — Encounter: Payer: Self-pay | Admitting: Family Medicine

## 2016-06-16 ENCOUNTER — Ambulatory Visit (INDEPENDENT_AMBULATORY_CARE_PROVIDER_SITE_OTHER): Payer: Medicare Other | Admitting: Family Medicine

## 2016-06-16 VITALS — BP 120/70 | HR 64 | Ht 68.0 in | Wt 220.0 lb

## 2016-06-16 DIAGNOSIS — F419 Anxiety disorder, unspecified: Secondary | ICD-10-CM

## 2016-06-16 NOTE — Progress Notes (Signed)
Name: Daniel Kirby   MRN: [id number]    DOB: [birth year]   Date:06/16/2016       Progress Note  Subjective  Chief Complaint  Chief Complaint  Patient presents with  . Anxiety    stopped sertraline    Anxiety  Presents for follow-up visit. Symptoms include excessive worry and nervous/anxious behavior. Patient reports no chest pain, compulsions, confusion, decreased concentration, depressed mood, dizziness, dry mouth, feeling of choking, hyperventilation, impotence, insomnia, irritability, malaise, muscle tension, nausea, obsessions, palpitations, panic, restlessness, shortness of breath or suicidal ideas. Primary symptoms comment: pruritis. Symptoms occur occasionally. The severity of symptoms is moderate. The quality of sleep is fair.      No problem-specific Assessment & Plan notes found for this encounter.   Past Medical History:  Diagnosis Date  . Abdominal pain    RUQ  . Arthritis    right ankle  . ED (erectile dysfunction)   . HLD (hyperlipidemia)   . Hypertension   . Hypothyroid   . Prostate cancer ([place])   . Vertigo    1 episode only, approx 1 month ago    Past Surgical History:  Procedure Laterality Date  . COLONOSCOPY  2006   normal  . COLONOSCOPY WITH PROPOFOL N/A 09/06/2015   Procedure: COLONOSCOPY WITH PROPOFOL;  Surgeon: Lucilla Lame, MD;  Location: [facility];  Service: Endoscopy;  Laterality: N/A;  . POLYPECTOMY  09/06/2015   Procedure: POLYPECTOMY;  Surgeon: Lucilla Lame, MD;  Location: [facility];  Service: Endoscopy;;  . robotic prostatectomy  2006    Family History  Problem Relation Age of Onset  . Diabetes    . Heart attack Father   . Kidney disease Neg Hx   . Prostate cancer Neg Hx     Social History   Social History  . Marital status: Married    Spouse name: N/A  . Number of children: N/A  . Years of education: N/A   Occupational History  . Not on file.   Social History Main Topics  . Smoking status: Former Research scientist (life sciences)   . Smokeless tobacco: Current User    Types: Snuff     Comment: smokes a cigar every 6 months  . Alcohol use 0.0 oz/week     Comment: occasional, 1-2 drinks per year  . Drug use: No  . Sexual activity: Yes   Other Topics Concern  . Not on file   Social History Narrative  . No narrative on file    No Known Allergies   Review of Systems  Constitutional: Negative for chills, fever, irritability, malaise/fatigue and weight loss.  HENT: Negative for ear discharge, ear pain and sore throat.   Eyes: Negative for blurred vision.  Respiratory: Negative for cough, sputum production, shortness of breath and wheezing.   Cardiovascular: Negative for chest pain, palpitations and leg swelling.  Gastrointestinal: Negative for abdominal pain, blood in stool, constipation, diarrhea, heartburn, melena and nausea.  Genitourinary: Negative for dysuria, frequency, hematuria, impotence and urgency.  Musculoskeletal: Negative for back pain, joint pain, myalgias and neck pain.  Skin: Negative for rash.  Neurological: Negative for dizziness, tingling, sensory change, focal weakness and headaches.  Endo/Heme/Allergies: Negative for environmental allergies and polydipsia. Does not bruise/bleed easily.  Psychiatric/Behavioral: Negative for confusion, decreased concentration, depression and suicidal ideas. The patient is nervous/anxious. The patient does not have insomnia.      Objective  Vitals:   06/16/16 1614  BP: 120/70  Pulse: 64  Weight: 220 lb (99.8 kg)  Height: 5\' 8"  (1.727 m)    Physical Exam  Constitutional: He is oriented to person, place, and time and well-developed, well-nourished, and in no distress.  HENT:  Head: Normocephalic.  Right Ear: External ear normal.  Left Ear: External ear normal.  Nose: Nose normal.  Mouth/Throat: Oropharynx is clear and moist.  Eyes: Conjunctivae and EOM are normal. Pupils are equal, round, and reactive to light. Right eye exhibits no discharge.  Left eye exhibits no discharge. No scleral icterus.  Neck: Normal range of motion. Neck supple. No JVD present. No tracheal deviation present. No thyromegaly present.  Cardiovascular: Normal rate, regular rhythm, normal heart sounds and intact distal pulses.  Exam reveals no gallop and no friction rub.   No murmur heard. Pulmonary/Chest: Breath sounds normal. No respiratory distress. He has no wheezes. He has no rales.  Abdominal: Soft. Bowel sounds are normal. He exhibits no mass. There is no hepatosplenomegaly. There is no tenderness. There is no rebound, no guarding and no CVA tenderness.  Musculoskeletal: Normal range of motion. He exhibits no edema or tenderness.  Lymphadenopathy:    He has no cervical adenopathy.  Neurological: He is alert and oriented to person, place, and time. He has normal sensation, normal strength, normal reflexes and intact cranial nerves. No cranial nerve deficit.  Skin: Skin is warm. No rash noted.  Psychiatric: Mood and affect normal.  Nursing note and vitals reviewed.     Assessment & Plan  Problem List Items Addressed This Visit    None    Visit Diagnoses    Acute anxiety    -  Primary        Dr. Otilio Miu [facility]  06/16/16

## 2016-06-22 ENCOUNTER — Ambulatory Visit: Payer: Medicare Other | Admitting: Family Medicine

## 2016-06-25 ENCOUNTER — Encounter: Payer: Self-pay | Admitting: Urology

## 2016-06-25 ENCOUNTER — Ambulatory Visit (INDEPENDENT_AMBULATORY_CARE_PROVIDER_SITE_OTHER): Payer: Medicare Other | Admitting: Urology

## 2016-06-25 VITALS — BP 139/82 | HR 76 | Ht 67.0 in | Wt 217.8 lb

## 2016-06-25 DIAGNOSIS — N528 Other male erectile dysfunction: Secondary | ICD-10-CM

## 2016-06-25 DIAGNOSIS — N529 Male erectile dysfunction, unspecified: Secondary | ICD-10-CM

## 2016-06-25 NOTE — Progress Notes (Signed)
Patient's wife is out of town and he did not want to inject himself today.

## 2016-07-07 ENCOUNTER — Ambulatory Visit: Payer: Medicare Other | Admitting: Urology

## 2016-07-27 ENCOUNTER — Encounter: Payer: Self-pay | Admitting: Urology

## 2016-07-27 ENCOUNTER — Ambulatory Visit (INDEPENDENT_AMBULATORY_CARE_PROVIDER_SITE_OTHER): Payer: Medicare Other | Admitting: Urology

## 2016-07-27 VITALS — Ht 67.0 in | Wt 215.3 lb

## 2016-07-27 DIAGNOSIS — N528 Other male erectile dysfunction: Secondary | ICD-10-CM | POA: Diagnosis not present

## 2016-07-27 DIAGNOSIS — Z8546 Personal history of malignant neoplasm of prostate: Secondary | ICD-10-CM | POA: Diagnosis not present

## 2016-07-27 DIAGNOSIS — N529 Male erectile dysfunction, unspecified: Secondary | ICD-10-CM

## 2016-07-27 NOTE — Progress Notes (Signed)
10:28 PM   Daniel Kirby [birth year] [id number]  Referring provider: Juline Patch, MD [address] [place], [place] [address]  Chief Complaint  Patient presents with  . Erectile Dysfunction    Trimix injection teaching    HPI: Patient is a 70 year old African-American male with a history of prostate cancer who is status post robotic prostatectomy in 2006 with adjunctive radiation for positive surgical margins and erectile dysfunction who presents today for a Trimix injection teaching and to demonstrate his ability to correctly inject the medication.    Erectile dysfunction Patient also suffers from erectile dysfunction his SH IM score is 5, which is severe erectile dysfunction function. He does admit to nocturnal tumescence, but he is unable to achieve or maintain an erection satisfactory enough for intercourse.  He has tried PDE 5 inhibitors in the past and Trimix injections with limited success. When I saw him at  previous visit we briefly discussed penile prosthesis placement and I have given him a DVD. He did have the opportunity to watch the video, but he does not want to pursue surgical intervention at this time.  Patient has good success with his Trimix injection.  He would like to continue the medication and be taught how to self inject.    PMH: Past Medical History:  Diagnosis Date  . Abdominal pain    RUQ  . Arthritis    right ankle  . ED (erectile dysfunction)   . HLD (hyperlipidemia)   . Hypertension   . Hypothyroid   . Prostate cancer ([facility])   . Vertigo    1 episode only, approx 1 month ago    Surgical History: Past Surgical History:  Procedure Laterality Date  . COLONOSCOPY  2006   normal  . COLONOSCOPY WITH PROPOFOL N/A 09/06/2015   Procedure: COLONOSCOPY WITH PROPOFOL;  Surgeon: Lucilla Lame, MD;  Location: [facility];  Service: Endoscopy;  Laterality: N/A;  . POLYPECTOMY  09/06/2015   Procedure: POLYPECTOMY;  Surgeon: Lucilla Lame, MD;   Location: [facility];  Service: Endoscopy;;  . robotic prostatectomy  2006    Home Medications:    Medication List       Accurate as of 07/27/16 11:59 PM. Always use your most recent med list.          ALPRAZolam 0.25 MG tablet Commonly known as:  XANAX Take 1 tablet (0.25 mg total) by mouth 3 (three) times daily as needed for anxiety.   amoxicillin-clavulanate 875-125 MG tablet Commonly known as:  AUGMENTIN   benazepril 10 MG tablet Commonly known as:  LOTENSIN Take 1 tablet (10 mg total) by mouth daily.   benzonatate 100 MG capsule Commonly known as:  TESSALON   levothyroxine 100 MCG tablet Commonly known as:  SYNTHROID, LEVOTHROID TAKE 1 TABLET EVERY DAY   meloxicam 7.5 MG tablet Commonly known as:  MOBIC   PRESCRIPTION MEDICATION Trimix injection   sertraline 50 MG tablet Commonly known as:  ZOLOFT Take 50 mg by mouth daily.       Allergies: No Known Allergies  Family History: Family History  Problem Relation Age of Onset  . Diabetes    . Heart attack Father   . Kidney disease Neg Hx   . Prostate cancer Neg Hx     Social History:  reports that he has quit smoking. His smokeless tobacco use includes Snuff. He reports that he drinks alcohol. He reports that he does not use drugs.  ROS: UROLOGY Frequent  Urination?: No Hard to postpone urination?: No Burning/pain with urination?: No Get up at night to urinate?: No Leakage of urine?: No Urine stream starts and stops?: No Trouble starting stream?: No Do you have to strain to urinate?: No Blood in urine?: No Urinary tract infection?: No Sexually transmitted disease?: No Injury to kidneys or bladder?: No Painful intercourse?: No Weak stream?: No Erection problems?: Yes Penile pain?: No  Gastrointestinal Nausea?: No Vomiting?: No Indigestion/heartburn?: No Diarrhea?: No Constipation?: No  Constitutional Fever: No Night sweats?: No Weight loss?: No Fatigue?: No  Skin Skin  rash/lesions?: No Itching?: No  Eyes Blurred vision?: No Double vision?: No  Ears/Nose/Throat Sore throat?: No Sinus problems?: No  Hematologic/Lymphatic Swollen glands?: No Easy bruising?: No  Cardiovascular Leg swelling?: No Chest pain?: No  Respiratory Cough?: No Shortness of breath?: No  Endocrine Excessive thirst?: No  Musculoskeletal Back pain?: No Joint pain?: No  Neurological Headaches?: No Dizziness?: No  Psychologic Depression?: No Anxiety?: No  Physical Exam: Ht 5\' 7"  (1.702 m)   Wt 215 lb 4.8 oz (97.7 kg)   BMI 33.72 kg/m   GU: Patient with circumcised phallus.  Urethral meatus is patent.  No penile discharge. No penile lesions or rashes. Scrotum without lesions, cysts, rashes and/or edema.  Testicles are located scrotally bilaterally. No masses are appreciated in the testicles. Left and right epididymis are normal. Rectal: Deferred.    Laboratory Data:  Lab Results  Component Value Date   CREATININE 0.91 07/24/2015   PSA history  Less than 0.1 ng/mL on 06/04/2015   < 0.1 ng/mL on 04/29/2016  Lab Results  Component Value Date   HGBA1C 5.7 (H) 07/24/2015   Patient presents today for instructions on Trimix injections and to demonstrate his proficiency with the injections.  The patient washed his hands.  He then was instructed to tap the syringe to allow air bubbles to float to the top of the syringe. He then depressed the plunger to allow the air to escape.  He then selected an injection site at the right base of his penis between 9 and 11:00 positions between the base and midportion of the penis. He was careful to avoid the 6 and 12:00 positions and any surface veins or arteries.  He then grasped the head of the penis towards the left with light tension.  He then cleansed the area using an alcohol swab. He then took the syringe and injected at a 90 angle into the corpus cavernosum 5 g of Trimix.  He then applied compression to the  injection site.  He tolerated the procedure well. He stated he had good comfort level with the process of the injections.   Assessment & Plan:    1. Erectile dysfunction:  SHIM score is 5.  Patient has suffered with erectile dysfunction since his robotic prostatectomy in 2006.  He had success with the Trimix injections.  He will continue with this medication.  He will RTC in 04/2017 for SHIM and exam.    2. History of prostate cancer:   Patient to RTC in 04/2017 for IPSS, PSA and exam.    No Follow-up on file.  Zara Council, [facility] [address] [place], [place] [address] [phone]

## 2016-07-28 ENCOUNTER — Telehealth: Payer: Self-pay | Admitting: Urology

## 2016-07-28 NOTE — Telephone Encounter (Signed)
Would you call in 5 g Trimix syringes for the patient?

## 2016-07-29 NOTE — Telephone Encounter (Signed)
Order has been placed.

## 2016-10-12 DIAGNOSIS — H40003 Preglaucoma, unspecified, bilateral: Secondary | ICD-10-CM | POA: Diagnosis not present

## 2016-10-13 ENCOUNTER — Other Ambulatory Visit: Payer: Self-pay | Admitting: Family Medicine

## 2016-10-13 DIAGNOSIS — E785 Hyperlipidemia, unspecified: Secondary | ICD-10-CM

## 2016-10-19 DIAGNOSIS — H40003 Preglaucoma, unspecified, bilateral: Secondary | ICD-10-CM | POA: Diagnosis not present

## 2016-11-08 ENCOUNTER — Other Ambulatory Visit: Payer: Self-pay | Admitting: Family Medicine

## 2016-11-08 DIAGNOSIS — E039 Hypothyroidism, unspecified: Secondary | ICD-10-CM

## 2016-12-02 ENCOUNTER — Ambulatory Visit: Payer: Medicare Other | Admitting: Family Medicine

## 2016-12-08 ENCOUNTER — Ambulatory Visit (INDEPENDENT_AMBULATORY_CARE_PROVIDER_SITE_OTHER): Payer: Medicare Other | Admitting: Family Medicine

## 2016-12-08 ENCOUNTER — Telehealth: Payer: Self-pay | Admitting: Urology

## 2016-12-08 ENCOUNTER — Encounter: Payer: Self-pay | Admitting: Family Medicine

## 2016-12-08 VITALS — BP 130/80 | HR 80 | Ht 67.0 in | Wt 221.0 lb

## 2016-12-08 DIAGNOSIS — E039 Hypothyroidism, unspecified: Secondary | ICD-10-CM

## 2016-12-08 DIAGNOSIS — M1990 Unspecified osteoarthritis, unspecified site: Secondary | ICD-10-CM

## 2016-12-08 DIAGNOSIS — I10 Essential (primary) hypertension: Secondary | ICD-10-CM

## 2016-12-08 DIAGNOSIS — M5412 Radiculopathy, cervical region: Secondary | ICD-10-CM

## 2016-12-08 DIAGNOSIS — E782 Mixed hyperlipidemia: Secondary | ICD-10-CM

## 2016-12-08 DIAGNOSIS — Z1159 Encounter for screening for other viral diseases: Secondary | ICD-10-CM | POA: Diagnosis not present

## 2016-12-08 DIAGNOSIS — F324 Major depressive disorder, single episode, in partial remission: Secondary | ICD-10-CM | POA: Diagnosis not present

## 2016-12-08 DIAGNOSIS — F419 Anxiety disorder, unspecified: Secondary | ICD-10-CM | POA: Diagnosis not present

## 2016-12-08 MED ORDER — BENAZEPRIL HCL 10 MG PO TABS: 10.0000 mg | ORAL_TABLET | Freq: Every day | ORAL | 5 refills | Status: DC

## 2016-12-08 MED ORDER — PRAVASTATIN SODIUM 10 MG PO TABS: 10.0000 mg | ORAL_TABLET | Freq: Every day | ORAL | 1 refills | Status: DC

## 2016-12-08 MED ORDER — LEVOTHYROXINE SODIUM 100 MCG PO TABS: 100.0000 ug | ORAL_TABLET | Freq: Every day | ORAL | 1 refills | Status: DC

## 2016-12-08 MED ORDER — MELOXICAM 7.5 MG PO TABS: 7.5000 mg | ORAL_TABLET | Freq: Every day | ORAL | 1 refills | Status: DC

## 2016-12-08 MED ORDER — ALPRAZOLAM 0.25 MG PO TABS: 0.2500 mg | ORAL_TABLET | Freq: Every morning | ORAL | 5 refills | Status: DC

## 2016-12-08 MED ORDER — SERTRALINE HCL 50 MG PO TABS: 50.0000 mg | ORAL_TABLET | Freq: Every day | ORAL | 1 refills | Status: DC

## 2016-12-08 NOTE — Progress Notes (Signed)
Name: Daniel Kirby   MRN: [id number]    DOB: 07-26-1946   Date:12/08/2016       Progress Note  Subjective  Chief Complaint  Chief Complaint  Patient presents with  . Hypothyroidism  . Hypertension  . Hyperlipidemia    Hypertension  This is a chronic problem. The current episode started more than 1 year ago. The problem is unchanged. The problem is controlled. Associated symptoms include anxiety and neck pain. Pertinent negatives include no blurred vision, chest pain, headaches, malaise/fatigue, orthopnea, palpitations, peripheral edema, PND, shortness of breath or sweats. There are no associated agents to hypertension. Risk factors for coronary artery disease include male gender. Past treatments include ACE inhibitors. There are no compliance problems.  There is no history of angina, kidney disease, CAD/MI, CVA, heart failure, left ventricular hypertrophy, PVD, renovascular disease or retinopathy. Identifiable causes of hypertension include a thyroid problem. There is no history of chronic renal disease.  Hyperlipidemia  This is a chronic problem. The current episode started more than 1 year ago. The problem is controlled. Recent lipid tests were reviewed and are normal. He has no history of chronic renal disease. Associated symptoms include a focal sensory loss. Pertinent negatives include no chest pain, focal weakness, leg pain, myalgias or shortness of breath. Current antihyperlipidemic treatment includes statins. The current treatment provides moderate improvement of lipids. There are no compliance problems.   Thyroid Problem  Presents for follow-up visit. Symptoms include anxiety, cold intolerance and depressed mood. Patient reports no constipation, diaphoresis, diarrhea, fatigue, hair loss, palpitations, weight gain or weight loss. The symptoms have been stable. His past medical history is significant for hyperlipidemia. There is no history of heart failure.  Knee Pain   The pain is present  in the right knee. The quality of the pain is described as aching. The pain is moderate. Associated symptoms include tingling. He has tried NSAIDs for the symptoms. The treatment provided mild relief.  Neurologic Problem  The patient's primary symptoms include focal sensory loss. The patient's pertinent negatives include no focal weakness. Primary symptoms comment: right  ulna distribution. This is a chronic problem. The problem has been waxing and waning since onset. Associated symptoms include neck pain. Pertinent negatives include no abdominal pain, back pain, chest pain, diaphoresis, dizziness, fatigue, fever, headaches, nausea, palpitations or shortness of breath.  Depression       (Right  ulna distribution)  This is a chronic problem.  The onset quality is gradual.   The problem occurs intermittently.  The problem has been waxing and waning since onset.  Associated symptoms include sad.  Associated symptoms include no fatigue, no helplessness, no hopelessness, does not have insomnia, not irritable, no decreased interest, no myalgias, no headaches and no suicidal ideas.  Past treatments include SSRIs - Selective serotonin reuptake inhibitors.  Past medical history includes thyroid problem and anxiety.   Anxiety  Presents for follow-up visit. Symptoms include depressed mood, excessive worry and nervous/anxious behavior. Patient reports no chest pain, dizziness, insomnia, nausea, palpitations, shortness of breath or suicidal ideas. Primary symptoms comment: right  ulna distribution. Symptoms occur most days. The severity of symptoms is mild.      No problem-specific Assessment & Plan notes found for this encounter.   Past Medical History:  Diagnosis Date  . Abdominal pain    RUQ  . Arthritis    right ankle  . ED (erectile dysfunction)   . HLD (hyperlipidemia)   . Hypertension   . Hypothyroid   .  Prostate cancer ([facility])   . Vertigo    1 episode only, approx 1 month ago    Past Surgical  History:  Procedure Laterality Date  . COLONOSCOPY  2006   normal  . COLONOSCOPY WITH PROPOFOL N/A 09/06/2015   Procedure: COLONOSCOPY WITH PROPOFOL;  Surgeon: Lucilla Lame, MD;  Location: [facility];  Service: Endoscopy;  Laterality: N/A;  . POLYPECTOMY  09/06/2015   Procedure: POLYPECTOMY;  Surgeon: Lucilla Lame, MD;  Location: [facility];  Service: Endoscopy;;  . robotic prostatectomy  2006    Family History  Problem Relation Age of Onset  . Diabetes    . Heart attack Father   . Kidney disease Neg Hx   . Prostate cancer Neg Hx     Social History   Social History  . Marital status: Married    Spouse name: N/A  . Number of children: N/A  . Years of education: N/A   Occupational History  . Not on file.   Social History Main Topics  . Smoking status: Former Research scientist (life sciences)  . Smokeless tobacco: Current User    Types: Snuff     Comment: smokes a cigar every 6 months  . Alcohol use 0.0 oz/week     Comment: occasional, 1-2 drinks per year  . Drug use: No  . Sexual activity: Yes   Other Topics Concern  . Not on file   Social History Narrative  . No narrative on file    No Known Allergies   Review of Systems  Constitutional: Negative for chills, diaphoresis, fatigue, fever, malaise/fatigue, weight gain and weight loss.  HENT: Negative for ear discharge, ear pain and sore throat.   Eyes: Negative for blurred vision.  Respiratory: Negative for cough, sputum production, shortness of breath and wheezing.   Cardiovascular: Negative for chest pain, palpitations, orthopnea, leg swelling and PND.  Gastrointestinal: Negative for abdominal pain, blood in stool, constipation, diarrhea, heartburn, melena and nausea.  Genitourinary: Negative for dysuria, frequency, hematuria and urgency.  Musculoskeletal: Positive for neck pain. Negative for back pain, joint pain and myalgias.  Skin: Negative for rash.  Neurological: Positive for tingling. Negative for dizziness,  sensory change, focal weakness and headaches.  Endo/Heme/Allergies: Positive for cold intolerance. Negative for environmental allergies and polydipsia. Does not bruise/bleed easily.  Psychiatric/Behavioral: Positive for depression. Negative for suicidal ideas. The patient is nervous/anxious. The patient does not have insomnia.      Objective  Vitals:   12/08/16 0832  BP: 130/80  Pulse: 80  Weight: 221 lb (100.2 kg)  Height: 5\' 7"  (1.702 m)    Physical Exam  Constitutional: He is oriented to person, place, and time and well-developed, well-nourished, and in no distress. He is not irritable.  HENT:  Head: Normocephalic.  Right Ear: External ear normal.  Left Ear: External ear normal.  Nose: Nose normal.  Mouth/Throat: Oropharynx is clear and moist.  Eyes: Conjunctivae and EOM are normal. Pupils are equal, round, and reactive to light. Right eye exhibits no discharge. Left eye exhibits no discharge. No scleral icterus.  Neck: Normal range of motion. Neck supple. No JVD present. No tracheal deviation present. No thyromegaly present.  Cardiovascular: Normal rate, regular rhythm, normal heart sounds and intact distal pulses.  Exam reveals no gallop and no friction rub.   No murmur heard. Pulmonary/Chest: Breath sounds normal. No respiratory distress. He has no wheezes. He has no rales.  Abdominal: Soft. Bowel sounds are normal. He exhibits no mass. There is no hepatosplenomegaly. There  is no tenderness. There is no rebound, no guarding and no CVA tenderness.  Musculoskeletal: Normal range of motion. He exhibits no edema or tenderness.  Lymphadenopathy:    He has no cervical adenopathy.  Neurological: He is alert and oriented to person, place, and time. He has normal sensation, normal strength, normal reflexes and intact cranial nerves. No cranial nerve deficit.  Skin: Skin is warm. No rash noted.  Psychiatric: Mood and affect normal.  Nursing note and vitals  reviewed.     Assessment & Plan  Problem List Items Addressed This Visit      Cardiovascular and Mediastinum   Essential hypertension - Primary   Relevant Medications   benazepril (LOTENSIN) 10 MG tablet   pravastatin (PRAVACHOL) 10 MG tablet   Other Relevant Orders   Renal Function Panel     Endocrine   Adult hypothyroidism   Relevant Medications   levothyroxine (SYNTHROID, LEVOTHROID) 100 MCG tablet   Other Relevant Orders   TSH     Nervous and Auditory   Cervical radiculopathy   Relevant Medications   sertraline (ZOLOFT) 50 MG tablet   ALPRAZolam (XANAX) 0.25 MG tablet     Musculoskeletal and Integument   Arthritis   Relevant Medications   meloxicam (MOBIC) 7.5 MG tablet     Other   Major depressive disorder with single episode, in partial remission (HCC)   Relevant Medications   sertraline (ZOLOFT) 50 MG tablet   ALPRAZolam (XANAX) 0.25 MG tablet   Acute anxiety   Relevant Medications   sertraline (ZOLOFT) 50 MG tablet   ALPRAZolam (XANAX) 0.25 MG tablet   Mixed hyperlipidemia   Relevant Medications   benazepril (LOTENSIN) 10 MG tablet   pravastatin (PRAVACHOL) 10 MG tablet   Other Relevant Orders   Lipid Profile    Other Visit Diagnoses    Need for hepatitis C screening test       Relevant Orders   Hepatitis C antibody        Dr. Deanna Jones [facility]  12/08/16

## 2016-12-08 NOTE — Telephone Encounter (Signed)
A refill for Trimix is okay.

## 2016-12-08 NOTE — Telephone Encounter (Signed)
Patient is calling requesting a new prescription for Trimix.    Please advise.

## 2016-12-09 LAB — RENAL FUNCTION PANEL
ALBUMIN: 4.4 g/dL (ref 3.5–4.8)
BUN/Creatinine Ratio: 8 — ABNORMAL LOW (ref 10–24)
BUN: 7 mg/dL — ABNORMAL LOW (ref 8–27)
CHLORIDE: 97 mmol/L (ref 96–106)
CO2: 23 mmol/L (ref 18–29)
Calcium: 9.2 mg/dL (ref 8.6–10.2)
Creatinine, Ser: 0.85 mg/dL (ref 0.76–1.27)
GFR calc non Af Amer: 88 mL/min/{1.73_m2} (ref >59)
GFR, EST AFRICAN AMERICAN: 101 mL/min/{1.73_m2} (ref >59)
GLUCOSE: 133 mg/dL — AB (ref 65–99)
PHOSPHORUS: 3 mg/dL (ref 2.5–4.5)
POTASSIUM: 4.2 mmol/L (ref 3.5–5.2)
Sodium: 139 mmol/L (ref 134–144)

## 2016-12-09 LAB — LIPID PANEL
Chol/HDL Ratio: 4.2 ratio units (ref 0.0–5.0)
Cholesterol, Total: 173 mg/dL (ref 100–199)
HDL: 41 mg/dL (ref >39)
LDL Calculated: 109 mg/dL — ABNORMAL HIGH (ref 0–99)
TRIGLYCERIDES: 115 mg/dL (ref 0–149)
VLDL Cholesterol Cal: 23 mg/dL (ref 5–40)

## 2016-12-09 LAB — TSH: TSH: 6.61 u[IU]/mL — ABNORMAL HIGH (ref 0.450–4.500)

## 2016-12-09 LAB — HEPATITIS C ANTIBODY: Hep C Virus Ab: <0.1 s/co ratio (ref 0.0–0.9)

## 2016-12-09 NOTE — Telephone Encounter (Signed)
Refills called into [facility].

## 2016-12-10 ENCOUNTER — Other Ambulatory Visit: Payer: Self-pay

## 2016-12-10 DIAGNOSIS — E039 Hypothyroidism, unspecified: Secondary | ICD-10-CM

## 2016-12-10 MED ORDER — LEVOTHYROXINE SODIUM 112 MCG PO TABS: 100.0000 ug | ORAL_TABLET | Freq: Every day | ORAL | 1 refills | Status: DC

## 2016-12-15 ENCOUNTER — Other Ambulatory Visit: Payer: Medicare Other

## 2016-12-15 DIAGNOSIS — R7309 Other abnormal glucose: Secondary | ICD-10-CM | POA: Diagnosis not present

## 2016-12-16 LAB — HEMOGLOBIN A1C
Est. average glucose Bld gHb Est-mCnc: 120 mg/dL
Hgb A1c MFr Bld: 5.8 % — ABNORMAL HIGH (ref 4.8–5.6)

## 2017-01-11 ENCOUNTER — Ambulatory Visit (INDEPENDENT_AMBULATORY_CARE_PROVIDER_SITE_OTHER): Payer: Medicare Other | Admitting: Family Medicine

## 2017-01-11 ENCOUNTER — Encounter: Payer: Self-pay | Admitting: Family Medicine

## 2017-01-11 VITALS — BP 140/92 | HR 80 | Ht 67.0 in | Wt 225.0 lb

## 2017-01-11 DIAGNOSIS — I10 Essential (primary) hypertension: Secondary | ICD-10-CM | POA: Diagnosis not present

## 2017-01-11 MED ORDER — BENAZEPRIL-HYDROCHLOROTHIAZIDE 10-12.5 MG PO TABS: 1.0000 | ORAL_TABLET | Freq: Every day | ORAL | 1 refills | Status: DC

## 2017-01-11 NOTE — Progress Notes (Signed)
Name: Daniel Kirby   MRN: [id number]    DOB: 12-11-1945   Date:01/11/2017       Progress Note  Subjective  Chief Complaint  Chief Complaint  Patient presents with  . Hypertension    checked B/P yesterday due to feeling "whoozy"- B/P was 200/110. Has been taking only Benazepril approx 15+ years    Hypertension  This is a chronic problem. The current episode started more than 1 year ago. The problem has been gradually worsening since onset. The problem is controlled. Pertinent negatives include no anxiety, blurred vision, chest pain, headaches, malaise/fatigue, neck pain, orthopnea, palpitations, peripheral edema, PND, shortness of breath or sweats. There are no associated agents to hypertension. There are no known risk factors for coronary artery disease. Past treatments include ACE inhibitors. The current treatment provides mild improvement. There is no history of angina, kidney disease, CAD/MI, CVA, heart failure, left ventricular hypertrophy, PVD, renovascular disease or retinopathy. There is no history of chronic renal disease or a hypertension causing med.    No problem-specific Assessment & Plan notes found for this encounter.   Past Medical History:  Diagnosis Date  . Abdominal pain    RUQ  . Arthritis    right ankle  . ED (erectile dysfunction)   . HLD (hyperlipidemia)   . Hypertension   . Hypothyroid   . Prostate cancer ([place])   . Vertigo    1 episode only, approx 1 month ago    Past Surgical History:  Procedure Laterality Date  . COLONOSCOPY  2006   normal  . COLONOSCOPY WITH PROPOFOL N/A 09/06/2015   Procedure: COLONOSCOPY WITH PROPOFOL;  Surgeon: Lucilla Lame, MD;  Location: [facility];  Service: Endoscopy;  Laterality: N/A;  . POLYPECTOMY  09/06/2015   Procedure: POLYPECTOMY;  Surgeon: Lucilla Lame, MD;  Location: [facility];  Service: Endoscopy;;  . robotic prostatectomy  2006    Family History  Problem Relation Age of Onset  . Diabetes    .  Heart attack Father   . Kidney disease Neg Hx   . Prostate cancer Neg Hx     Social History   Social History  . Marital status: Married    Spouse name: N/A  . Number of children: N/A  . Years of education: N/A   Occupational History  . Not on file.   Social History Main Topics  . Smoking status: Former Research scientist (life sciences)  . Smokeless tobacco: Current User    Types: Snuff     Comment: smokes a cigar every 6 months  . Alcohol use 0.0 oz/week     Comment: occasional, 1-2 drinks per year  . Drug use: No  . Sexual activity: Yes   Other Topics Concern  . Not on file   Social History Narrative  . No narrative on file    No Known Allergies  Outpatient Medications Prior to Visit  Medication Sig Dispense Refill  . ALPRAZolam (XANAX) 0.25 MG tablet Take 1 tablet (0.25 mg total) by mouth every morning. 30 tablet 5  . benazepril (LOTENSIN) 10 MG tablet Take 1 tablet (10 mg total) by mouth daily. 30 tablet 5  . levothyroxine (SYNTHROID, LEVOTHROID) 112 MCG tablet Take 1 tablet (112 mcg total) by mouth daily. Change to this dosing due to labs 30 tablet 1  . meloxicam (MOBIC) 7.5 MG tablet Take 1 tablet (7.5 mg total) by mouth daily. 90 tablet 1  . pravastatin (PRAVACHOL) 10 MG tablet Take 1 tablet (10 mg total) by  mouth daily. 90 tablet 1  . PRESCRIPTION MEDICATION Trimix injection    . sertraline (ZOLOFT) 50 MG tablet Take 1 tablet (50 mg total) by mouth daily. 90 tablet 1   No facility-administered medications prior to visit.     Review of Systems  Constitutional: Negative for chills, fever, malaise/fatigue and weight loss.  HENT: Negative for ear discharge, ear pain and sore throat.   Eyes: Negative for blurred vision.  Respiratory: Negative for cough, sputum production, shortness of breath and wheezing.   Cardiovascular: Negative for chest pain, palpitations, orthopnea, leg swelling and PND.  Gastrointestinal: Negative for abdominal pain, blood in stool, constipation, diarrhea,  heartburn, melena and nausea.  Genitourinary: Negative for dysuria, frequency, hematuria and urgency.  Musculoskeletal: Negative for back pain, joint pain, myalgias and neck pain.  Skin: Negative for rash.  Neurological: Negative for dizziness, tingling, sensory change, focal weakness and headaches.  Endo/Heme/Allergies: Negative for environmental allergies and polydipsia. Does not bruise/bleed easily.  Psychiatric/Behavioral: Negative for depression and suicidal ideas. The patient is not nervous/anxious and does not have insomnia.      Objective  Vitals:   01/11/17 1602  BP: (!) 140/92  Pulse: 80  Weight: 225 lb (102.1 kg)  Height: 5\' 7"  (1.702 m)    Physical Exam  Constitutional: He is oriented to person, place, and time and well-developed, well-nourished, and in no distress.  HENT:  Head: Normocephalic.  Right Ear: External ear normal.  Left Ear: External ear normal.  Nose: Nose normal.  Mouth/Throat: Oropharynx is clear and moist.  Eyes: Conjunctivae and EOM are normal. Pupils are equal, round, and reactive to light. Right eye exhibits no discharge. Left eye exhibits no discharge. No scleral icterus.  Neck: Normal range of motion. Neck supple. No JVD present. No tracheal deviation present. No thyromegaly present.  Cardiovascular: Normal rate, regular rhythm, normal heart sounds and intact distal pulses.  Exam reveals no gallop and no friction rub.   No murmur heard. Pulmonary/Chest: Breath sounds normal. No respiratory distress. He has no wheezes. He has no rales.  Abdominal: Soft. Bowel sounds are normal. He exhibits no mass. There is no hepatosplenomegaly. There is no tenderness. There is no rebound, no guarding and no CVA tenderness.  Musculoskeletal: Normal range of motion. He exhibits no edema or tenderness.  Lymphadenopathy:    He has no cervical adenopathy.  Neurological: He is alert and oriented to person, place, and time. He has normal sensation, normal strength,  normal reflexes and intact cranial nerves. No cranial nerve deficit.  Skin: Skin is warm. No rash noted.  Psychiatric: Mood and affect normal.  Nursing note and vitals reviewed.     Assessment & Plan  Problem List Items Addressed This Visit      Cardiovascular and Mediastinum   Essential hypertension - Primary   Relevant Medications   benazepril-hydrochlorthiazide (LOTENSIN HCT) 10-12.5 MG tablet      Meds ordered this encounter  Medications  . benazepril-hydrochlorthiazide (LOTENSIN HCT) 10-12.5 MG tablet    Sig: Take 1 tablet by mouth daily.    Dispense:  90 tablet    Refill:  1      Dr. Otilio Miu [facility]  01/11/17

## 2017-02-20 ENCOUNTER — Other Ambulatory Visit: Payer: Self-pay | Admitting: Family Medicine

## 2017-02-20 DIAGNOSIS — E039 Hypothyroidism, unspecified: Secondary | ICD-10-CM

## 2017-02-23 ENCOUNTER — Encounter: Payer: Self-pay | Admitting: Family Medicine

## 2017-02-23 ENCOUNTER — Ambulatory Visit (INDEPENDENT_AMBULATORY_CARE_PROVIDER_SITE_OTHER): Payer: Medicare Other | Admitting: Family Medicine

## 2017-02-23 DIAGNOSIS — M1990 Unspecified osteoarthritis, unspecified site: Secondary | ICD-10-CM | POA: Diagnosis not present

## 2017-02-23 DIAGNOSIS — E782 Mixed hyperlipidemia: Secondary | ICD-10-CM | POA: Diagnosis not present

## 2017-02-23 DIAGNOSIS — F419 Anxiety disorder, unspecified: Secondary | ICD-10-CM | POA: Diagnosis not present

## 2017-02-23 DIAGNOSIS — F324 Major depressive disorder, single episode, in partial remission: Secondary | ICD-10-CM

## 2017-02-23 DIAGNOSIS — I10 Essential (primary) hypertension: Secondary | ICD-10-CM | POA: Diagnosis not present

## 2017-02-23 DIAGNOSIS — E039 Hypothyroidism, unspecified: Secondary | ICD-10-CM

## 2017-02-23 MED ORDER — SERTRALINE HCL 50 MG PO TABS: 50.0000 mg | ORAL_TABLET | Freq: Every day | ORAL | 1 refills | Status: DC

## 2017-02-23 MED ORDER — BENAZEPRIL-HYDROCHLOROTHIAZIDE 10-12.5 MG PO TABS: 1.0000 | ORAL_TABLET | Freq: Every day | ORAL | 1 refills | Status: DC

## 2017-02-23 MED ORDER — PRAVASTATIN SODIUM 10 MG PO TABS: 10.0000 mg | ORAL_TABLET | Freq: Every day | ORAL | 1 refills | Status: DC

## 2017-02-23 MED ORDER — LEVOTHYROXINE SODIUM 112 MCG PO TABS: ORAL_TABLET | ORAL | 1 refills | Status: DC

## 2017-02-23 MED ORDER — MELOXICAM 7.5 MG PO TABS: 7.5000 mg | ORAL_TABLET | Freq: Every day | ORAL | 1 refills | Status: DC

## 2017-02-23 NOTE — Progress Notes (Signed)
Name: Daniel Kirby   MRN: [id number]    DOB: May 14, 1946   Date:02/23/2017       Progress Note  Subjective  Chief Complaint  Chief Complaint  Patient presents with  . Follow-up    on sertraline  . Hypothyroidism    needs labs rechecked    Hypertension  This is a chronic problem. The current episode started more than 1 year ago. The problem has been gradually improving since onset. The problem is controlled. Pertinent negatives include no anxiety, blurred vision, chest pain, headaches, malaise/fatigue, neck pain, orthopnea, palpitations, peripheral edema, PND, shortness of breath or sweats. There are no associated agents to hypertension. There are no known risk factors for coronary artery disease. Past treatments include ACE inhibitors and diuretics. The current treatment provides moderate improvement. There are no compliance problems.  There is no history of angina, kidney disease, CAD/MI, CVA, heart failure, left ventricular hypertrophy, PVD or retinopathy. Identifiable causes of hypertension include a thyroid problem. There is no history of chronic renal disease, a hypertension causing med or renovascular disease.  Hyperlipidemia  This is a chronic problem. The problem is controlled. Recent lipid tests were reviewed and are normal. He has no history of chronic renal disease, diabetes, hypothyroidism, liver disease, obesity or nephrotic syndrome. There are no known factors aggravating his hyperlipidemia. Pertinent negatives include no chest pain, focal sensory loss, focal weakness, leg pain, myalgias or shortness of breath. Current antihyperlipidemic treatment includes statins. The current treatment provides mild improvement of lipids. There are no compliance problems.  Risk factors for coronary artery disease include hypertension, post-menopausal, dyslipidemia, male sex and obesity.  Thyroid Problem  Presents for follow-up visit. Symptoms include weight gain. Patient reports no anxiety,  constipation, depressed mood, diarrhea, dry skin, fatigue, hair loss, leg swelling, palpitations or weight loss. The symptoms have been stable. His past medical history is significant for hyperlipidemia. There is no history of diabetes or heart failure.  Depression       The patient presents with depression.  This is a chronic problem.  The onset quality is gradual.   The problem has been gradually improving since onset.  Associated symptoms include no decreased concentration, no fatigue, no helplessness, no hopelessness, does not have insomnia, not irritable, no decreased interest, no myalgias, no headaches, not sad and no suicidal ideas.  Past treatments include SSRIs - Selective serotonin reuptake inhibitors.  Previous treatment provided mild relief.  Past medical history includes thyroid problem and depression.     Pertinent negatives include no chronic pain, no hypothyroidism, no dementia, no anxiety and no suicide attempts.   No problem-specific Assessment & Plan notes found for this encounter.   Past Medical History:  Diagnosis Date  . Abdominal pain    RUQ  . Arthritis    right ankle  . ED (erectile dysfunction)   . HLD (hyperlipidemia)   . Hypertension   . Hypothyroid   . Prostate cancer ([place])   . Vertigo    1 episode only, approx 1 month ago    Past Surgical History:  Procedure Laterality Date  . COLONOSCOPY  2006   normal  . COLONOSCOPY WITH PROPOFOL N/A 09/06/2015   Procedure: COLONOSCOPY WITH PROPOFOL;  Surgeon: Lucilla Lame, MD;  Location: [facility];  Service: Endoscopy;  Laterality: N/A;  . POLYPECTOMY  09/06/2015   Procedure: POLYPECTOMY;  Surgeon: Lucilla Lame, MD;  Location: [facility];  Service: Endoscopy;;  . robotic prostatectomy  2006    Family History  Problem Relation Age of Onset  . Diabetes    . Heart attack Father   . Kidney disease Neg Hx   . Prostate cancer Neg Hx     Social History   Social History  . Marital status: Married     Spouse name: N/A  . Number of children: N/A  . Years of education: N/A   Occupational History  . Not on file.   Social History Main Topics  . Smoking status: Former Research scientist (life sciences)  . Smokeless tobacco: Current User    Types: Snuff     Comment: smokes a cigar every 6 months  . Alcohol use 0.0 oz/week     Comment: occasional, 1-2 drinks per year  . Drug use: No  . Sexual activity: Yes   Other Topics Concern  . Not on file   Social History Narrative  . No narrative on file    No Known Allergies  Outpatient Medications Prior to Visit  Medication Sig Dispense Refill  . PRESCRIPTION MEDICATION Trimix injection    . benazepril-hydrochlorthiazide (LOTENSIN HCT) 10-12.5 MG tablet Take 1 tablet by mouth daily. 90 tablet 1  . levothyroxine (SYNTHROID, LEVOTHROID) 112 MCG tablet TAKE ONE TABLET BY MOUTH ONCE DAILY CHANGE  TO  THIS  DOSING  DUE  TO  LABS 30 tablet 1  . meloxicam (MOBIC) 7.5 MG tablet Take 1 tablet (7.5 mg total) by mouth daily. 90 tablet 1  . pravastatin (PRAVACHOL) 10 MG tablet Take 1 tablet (10 mg total) by mouth daily. 90 tablet 1  . sertraline (ZOLOFT) 50 MG tablet Take 1 tablet (50 mg total) by mouth daily. 90 tablet 1  . ALPRAZolam (XANAX) 0.25 MG tablet Take 1 tablet (0.25 mg total) by mouth every morning. 30 tablet 5  . benazepril (LOTENSIN) 10 MG tablet Take 1 tablet (10 mg total) by mouth daily. 30 tablet 5   No facility-administered medications prior to visit.     Review of Systems  Constitutional: Positive for weight gain. Negative for chills, fatigue, fever, malaise/fatigue and weight loss.  HENT: Negative for ear discharge, ear pain and sore throat.   Eyes: Negative for blurred vision.  Respiratory: Negative for cough, sputum production, shortness of breath and wheezing.   Cardiovascular: Negative for chest pain, palpitations, orthopnea, leg swelling and PND.  Gastrointestinal: Negative for abdominal pain, blood in stool, constipation, diarrhea, heartburn,  melena and nausea.  Genitourinary: Negative for dysuria, frequency, hematuria and urgency.  Musculoskeletal: Negative for back pain, joint pain, myalgias and neck pain.  Skin: Negative for rash.  Neurological: Negative for dizziness, tingling, sensory change, focal weakness and headaches.  Endo/Heme/Allergies: Negative for environmental allergies and polydipsia. Does not bruise/bleed easily.  Psychiatric/Behavioral: Positive for depression. Negative for decreased concentration and suicidal ideas. The patient is not nervous/anxious and does not have insomnia.      Objective  Vitals:   02/23/17 0857  BP: 130/78  Pulse: 64  Weight: 224 lb (101.6 kg)  Height: 5\' 7"  (1.702 m)    Physical Exam  Constitutional: He is oriented to person, place, and time and well-developed, well-nourished, and in no distress. He is not irritable.  HENT:  Head: Normocephalic.  Right Ear: External ear normal.  Left Ear: External ear normal.  Nose: Nose normal.  Mouth/Throat: Oropharynx is clear and moist.  Eyes: Conjunctivae and EOM are normal. Pupils are equal, round, and reactive to light. Right eye exhibits no discharge. Left eye exhibits no discharge. No scleral icterus.  Neck: Normal range of  motion. Neck supple. No JVD present. No tracheal deviation present. No thyromegaly present.  Cardiovascular: Normal rate, regular rhythm, normal heart sounds and intact distal pulses.  Exam reveals no gallop and no friction rub.   No murmur heard. Pulmonary/Chest: Breath sounds normal. No respiratory distress. He has no wheezes. He has no rales.  Abdominal: Soft. Bowel sounds are normal. He exhibits no mass. There is no hepatosplenomegaly. There is no tenderness. There is no rebound, no guarding and no CVA tenderness.  Musculoskeletal: Normal range of motion. He exhibits no edema or tenderness.  Lymphadenopathy:    He has no cervical adenopathy.  Neurological: He is alert and oriented to person, place, and time.  He has normal sensation, normal strength, normal reflexes and intact cranial nerves. No cranial nerve deficit.  Skin: Skin is warm. No rash noted.  Psychiatric: Mood and affect normal.  Nursing note and vitals reviewed.     Assessment & Plan  Problem List Items Addressed This Visit      Cardiovascular and Mediastinum   Essential hypertension   Relevant Medications   benazepril-hydrochlorthiazide (LOTENSIN HCT) 10-12.5 MG tablet   pravastatin (PRAVACHOL) 10 MG tablet     Endocrine   Adult hypothyroidism   Relevant Medications   levothyroxine (SYNTHROID, LEVOTHROID) 112 MCG tablet     Musculoskeletal and Integument   Arthritis   Relevant Medications   meloxicam (MOBIC) 7.5 MG tablet     Other   Major depressive disorder with single episode, in partial remission (HCC)   Relevant Medications   sertraline (ZOLOFT) 50 MG tablet   Acute anxiety   Relevant Medications   sertraline (ZOLOFT) 50 MG tablet   Mixed hyperlipidemia   Relevant Medications   benazepril-hydrochlorthiazide (LOTENSIN HCT) 10-12.5 MG tablet   pravastatin (PRAVACHOL) 10 MG tablet      Meds ordered this encounter  Medications  . sertraline (ZOLOFT) 50 MG tablet    Sig: Take 1 tablet (50 mg total) by mouth daily.    Dispense:  90 tablet    Refill:  1  . levothyroxine (SYNTHROID, LEVOTHROID) 112 MCG tablet    Sig: TAKE ONE TABLET BY MOUTH ONCE DAILY CHANGE  TO  THIS  DOSING  DUE  TO  LABS    Dispense:  90 tablet    Refill:  1    Please consider 90 day supplies to promote better adherence  . meloxicam (MOBIC) 7.5 MG tablet    Sig: Take 1 tablet (7.5 mg total) by mouth daily.    Dispense:  90 tablet    Refill:  1  . benazepril-hydrochlorthiazide (LOTENSIN HCT) 10-12.5 MG tablet    Sig: Take 1 tablet by mouth daily.    Dispense:  90 tablet    Refill:  1  . pravastatin (PRAVACHOL) 10 MG tablet    Sig: Take 1 tablet (10 mg total) by mouth daily.    Dispense:  90 tablet    Refill:  1    Please  consider 90 day supplies to promote better adherence      Dr. Otilio Miu [facility]  02/23/17

## 2017-04-15 ENCOUNTER — Other Ambulatory Visit: Payer: Self-pay

## 2017-04-15 DIAGNOSIS — Z8546 Personal history of malignant neoplasm of prostate: Secondary | ICD-10-CM

## 2017-04-19 DIAGNOSIS — H40003 Preglaucoma, unspecified, bilateral: Secondary | ICD-10-CM | POA: Diagnosis not present

## 2017-04-22 ENCOUNTER — Other Ambulatory Visit: Payer: Medicare Other

## 2017-04-27 ENCOUNTER — Other Ambulatory Visit: Payer: Medicare Other

## 2017-04-27 DIAGNOSIS — Z8546 Personal history of malignant neoplasm of prostate: Secondary | ICD-10-CM

## 2017-04-28 LAB — PSA

## 2017-04-29 ENCOUNTER — Ambulatory Visit: Payer: Medicare Other | Admitting: Urology

## 2017-05-03 ENCOUNTER — Ambulatory Visit: Payer: Medicare Other | Admitting: Urology

## 2017-05-03 NOTE — Progress Notes (Signed)
11:36 AM   Daniel Kirby 09-01-1946 [id number]  Referring provider: Juline Patch, MD [address] [place], [place] [address]  Chief Complaint  Patient presents with  . Erectile Dysfunction    1year    HPI: Patient is a 71 year old African-American male with a history of prostate cancer who is status post robotic prostatectomy in 2006 with adjunctive radiation for positive surgical margins and erectile dysfunction who presents today for a one year follow up.     Erectile dysfunction His SHIM score is 15, which is mild to moderate ED.  His previous SH IM score was 5, which is severe erectile dysfunction function. He does admit to nocturnal tumescence, but he is unable to achieve or maintain an erection satisfactory enough for intercourse.  He has tried PDE 5 inhibitors in the past and Trimix injections with limited success.  We had briefly discussed penile prosthesis placement and I had given him a DVD. He did have the opportunity to watch the video, but he does not want to pursue surgical intervention at this time.  Patient has good success with his Trimix injection.  His wife has left him recently, but he would like to continue with the Trimix for dating purposes.        SHIM    Row Name 05/04/17 1109         SHIM: Over the last 6 months:   How do you rate your confidence that you could get and keep an erection? Low     When you had erections with sexual stimulation, how often were your erections hard enough for penetration (entering your partner)? A Few Times (much less than half the time)     During sexual intercourse, how often were you able to maintain your erection after you had penetrated (entered) your partner? Most Times (much more than half the time)     During sexual intercourse, how difficult was it to maintain your erection to completion of intercourse? Not Difficult     When you attempted sexual intercourse, how often was it satisfactory for you? A Few  Times (much less than half the time)       SHIM Total Score   SHIM 15        Score: 1-7 Severe ED 8-11 Moderate ED 12-16 Mild-Moderate ED 17-21 Mild ED 22-25 No ED  History of prostate cancer PCa History:  He underwent prostate biopsy on 05/25/05 for a PSA of 8.2 ng/mL, which showed bilateral prostatic adenocarcinoma, Gleason 3 + 4, in the left apex, right mid gland, and right base, 3/6 cores positive. The patient was sent to [facility] for further evaluation and care.  The patient was evaluated by Dr. Lyn Henri. He underwent laparoscopic robotic radical prostatectomy and right pelvic lymph node dissection on 08/26/05. Pathology showed bilateral prostatic adenocarcinoma, Gleason 3 + 4, involving 20% of the gland, no extracapsular extension, no seminal vesical invasion, positive extensive perineural invasion, no vascular invasion, positive right peripheral surgical margins, and no regional lymph nodes. The patient's postoperative course has been uncomplicated. However, given the positive margins, the patient now presents to our clinic for a discussion of further treatment options. The patient requests we proceed with radiation treatment. He then received adjuvant radiation therapy which he then completed in 01/2006. Since that point in time, his PSA's have been undetectable. His I PSS score is 6/1.  His previous I PSS score was 2/1.  IPSS    Row Name 05/04/17 1100         International Prostate Symptom Score   How often have you had the sensation of not emptying your bladder? Not at All     How often have you had to urinate less than every two hours? Less than half the time     How often have you found you stopped and started again several times when you urinated? Not at All     How often have you found it difficult to postpone urination? Less than half the time     How often have you had a weak urinary stream? Not at All     How often have you had to strain  to start urination? Not at All     How many times did you typically get up at night to urinate? 2 Times     Total IPSS Score 6       Quality of Life due to urinary symptoms   If you were to spend the rest of your life with your urinary condition just the way it is now how would you feel about that? Pleased        Score:  1-7 Mild 8-19 Moderate 20-35 Severe  PMH: Past Medical History:  Diagnosis Date  . Abdominal pain    RUQ  . Arthritis    right ankle  . ED (erectile dysfunction)   . HLD (hyperlipidemia)   . Hypertension   . Hypothyroid   . Prostate cancer ([place])   . Vertigo    1 episode only, approx 1 month ago    Surgical History: Past Surgical History:  Procedure Laterality Date  . COLONOSCOPY  2006   normal  . COLONOSCOPY WITH PROPOFOL N/A 09/06/2015   Procedure: COLONOSCOPY WITH PROPOFOL;  Surgeon: Lucilla Lame, MD;  Location: [facility];  Service: Endoscopy;  Laterality: N/A;  . POLYPECTOMY  09/06/2015   Procedure: POLYPECTOMY;  Surgeon: Lucilla Lame, MD;  Location: [place];  Service: Endoscopy;;  . robotic prostatectomy  2006    Home Medications:  Allergies as of 05/04/2017   No Known Allergies     Medication List       Accurate as of 05/04/17 11:36 AM. Always use your most recent med list.          benazepril-hydrochlorthiazide 10-12.5 MG tablet Commonly known as:  LOTENSIN HCT Take 1 tablet by mouth daily.   levothyroxine 112 MCG tablet Commonly known as:  SYNTHROID, LEVOTHROID TAKE ONE TABLET BY MOUTH ONCE DAILY CHANGE  TO  THIS  DOSING  DUE  TO  LABS   meloxicam 7.5 MG tablet Commonly known as:  MOBIC Take 1 tablet (7.5 mg total) by mouth daily.   pravastatin 10 MG tablet Commonly known as:  PRAVACHOL Take 1 tablet (10 mg total) by mouth daily.   PRESCRIPTION MEDICATION Trimix injection   sertraline 50 MG tablet Commonly known as:  ZOLOFT Take 1 tablet (50 mg total) by mouth daily.       Allergies: No Known  Allergies  Family History: Family History  Problem Relation Age of Onset  . Diabetes Unknown   . Heart attack Father   . Kidney disease Neg Hx   . Prostate cancer Neg Hx     Social History:  reports that he has quit smoking. His smoking use included Cigars. His smokeless tobacco use includes Snuff. He reports that he drinks alcohol. He reports that he does not  use drugs.  ROS: UROLOGY Frequent Urination?: No Hard to postpone urination?: No Burning/pain with urination?: No Get up at night to urinate?: No Leakage of urine?: No Urine stream starts and stops?: No Trouble starting stream?: No Do you have to strain to urinate?: No Blood in urine?: No Urinary tract infection?: No Sexually transmitted disease?: No Injury to kidneys or bladder?: No Painful intercourse?: No Weak stream?: No Erection problems?: No Penile pain?: No  Gastrointestinal Nausea?: No Vomiting?: No Indigestion/heartburn?: No Diarrhea?: No Constipation?: No  Constitutional Fever: No Night sweats?: No Weight loss?: No Fatigue?: No  Skin Skin rash/lesions?: No Itching?: No  Eyes Blurred vision?: No Double vision?: No  Ears/Nose/Throat Sore throat?: No Sinus problems?: No  Hematologic/Lymphatic Swollen glands?: No Easy bruising?: No  Cardiovascular Leg swelling?: No Chest pain?: No  Respiratory Cough?: No Shortness of breath?: No  Endocrine Excessive thirst?: No  Musculoskeletal Back pain?: No Joint pain?: No  Neurological Headaches?: No Dizziness?: No  Psychologic Depression?: No Anxiety?: No  Physical Exam: BP (!) 147/88   Pulse (!) 105   Ht 5\' 7"  (1.702 m)   Wt 220 lb (99.8 kg)   BMI 34.46 kg/m   Constitutional: Well nourished. Alert and oriented, No acute distress. HEENT: [place] AT, moist mucus membranes. Trachea midline, no masses. Cardiovascular: No clubbing, cyanosis, or edema. Respiratory: Normal respiratory effort, no increased work of breathing. GI:  Abdomen is soft, non tender, non distended, no abdominal masses. Liver and spleen not palpable.  No hernias appreciated.  Stool sample for occult testing is not indicated.   GU: No CVA tenderness.  No bladder fullness or masses.  Patient with circumcised phallus.   Urethral meatus is patent.  No penile discharge. No penile lesions or rashes. Scrotum without lesions, cysts, rashes and/or edema.  Testicles are located scrotally bilaterally. No masses are appreciated in the testicles. Left and right epididymis are normal. Rectal: Deferred Skin: No rashes, bruises or suspicious lesions. Lymph: No cervical or inguinal adenopathy. Neurologic: Grossly intact, no focal deficits, moving all 4 extremities. Psychiatric: Normal mood and affect.  Laboratory Data:  Lab Results  Component Value Date   CREATININE 0.85 12/08/2016   PSA history  Less than 0.1 ng/mL on 06/04/2015   < 0.1 ng/mL on 04/29/2016  < 0.1 ng/mL on 04/27/2017  Lab Results  Component Value Date   HGBA1C 5.8 (H) 12/15/2016   Assessment & Plan:    1. History of prostate cancer  - (08/2005) robotic prostatectomy - Gleason 3+4=7, pT2c NXMX   - (08/2005-01/2006) adjuvant radiation therapy - PSA undetectable  - Patient to [facility] in one year for IPSS, PSA and exam  2. Erectile dysfunction  - SHIM score is 15, it is improving  - Continue Trimix    Return in about 1 year (around 05/04/2018) for IPSS, PSA and exam.  Zara Council, [facility]  [place] [address], [place], [place] [address] [phone]

## 2017-05-04 ENCOUNTER — Encounter: Payer: Self-pay | Admitting: Urology

## 2017-05-04 ENCOUNTER — Telehealth: Payer: Self-pay | Admitting: Urology

## 2017-05-04 ENCOUNTER — Ambulatory Visit (INDEPENDENT_AMBULATORY_CARE_PROVIDER_SITE_OTHER): Payer: Medicare Other | Admitting: Urology

## 2017-05-04 VITALS — BP 147/88 | HR 105 | Ht 67.0 in | Wt 220.0 lb

## 2017-05-04 DIAGNOSIS — N5231 Erectile dysfunction following radical prostatectomy: Secondary | ICD-10-CM | POA: Diagnosis not present

## 2017-05-04 DIAGNOSIS — Z8546 Personal history of malignant neoplasm of prostate: Secondary | ICD-10-CM | POA: Diagnosis not present

## 2017-05-04 NOTE — Telephone Encounter (Signed)
Would you please call in a prescription for Trimix for the patient?

## 2017-05-05 NOTE — Telephone Encounter (Signed)
Done

## 2017-06-08 ENCOUNTER — Ambulatory Visit
Admission: RE | Admit: 2017-06-08 | Discharge: 2017-06-08 | Disposition: A | Discharge status: Home / Self Care | Payer: Medicare Other | Source: Ambulatory Visit | Attending: Family Medicine | Admitting: Family Medicine

## 2017-06-08 ENCOUNTER — Encounter: Payer: Self-pay | Admitting: Family Medicine

## 2017-06-08 ENCOUNTER — Ambulatory Visit (INDEPENDENT_AMBULATORY_CARE_PROVIDER_SITE_OTHER): Payer: Medicare Other | Admitting: Family Medicine

## 2017-06-08 VITALS — BP 130/64 | HR 80 | Ht 67.0 in | Wt 222.0 lb

## 2017-06-08 DIAGNOSIS — F419 Anxiety disorder, unspecified: Secondary | ICD-10-CM | POA: Diagnosis not present

## 2017-06-08 DIAGNOSIS — E039 Hypothyroidism, unspecified: Secondary | ICD-10-CM

## 2017-06-08 DIAGNOSIS — G8929 Other chronic pain: Secondary | ICD-10-CM | POA: Insufficient documentation

## 2017-06-08 DIAGNOSIS — M545 Low back pain, unspecified: Secondary | ICD-10-CM | POA: Insufficient documentation

## 2017-06-08 DIAGNOSIS — E782 Mixed hyperlipidemia: Secondary | ICD-10-CM

## 2017-06-08 DIAGNOSIS — I10 Essential (primary) hypertension: Secondary | ICD-10-CM

## 2017-06-08 DIAGNOSIS — M1990 Unspecified osteoarthritis, unspecified site: Secondary | ICD-10-CM | POA: Diagnosis not present

## 2017-06-08 DIAGNOSIS — F324 Major depressive disorder, single episode, in partial remission: Secondary | ICD-10-CM

## 2017-06-08 DIAGNOSIS — M5136 Other intervertebral disc degeneration, lumbar region: Secondary | ICD-10-CM | POA: Insufficient documentation

## 2017-06-08 MED ORDER — MELOXICAM 7.5 MG PO TABS: 7.5000 mg | ORAL_TABLET | Freq: Every day | ORAL | 1 refills | Status: DC

## 2017-06-08 MED ORDER — BENAZEPRIL-HYDROCHLOROTHIAZIDE 10-12.5 MG PO TABS
1.0000 | ORAL_TABLET | Freq: Every day | ORAL | 1 refills | Status: DC
Start: 2017-06-08 — End: 2017-12-07

## 2017-06-08 MED ORDER — SERTRALINE HCL 50 MG PO TABS: 50.0000 mg | ORAL_TABLET | Freq: Every day | ORAL | 1 refills | Status: DC

## 2017-06-08 MED ORDER — PRAVASTATIN SODIUM 10 MG PO TABS: 10.0000 mg | ORAL_TABLET | Freq: Every day | ORAL | 1 refills | Status: DC

## 2017-06-08 MED ORDER — LEVOTHYROXINE SODIUM 112 MCG PO TABS: ORAL_TABLET | ORAL | 1 refills | Status: DC

## 2017-06-08 NOTE — Progress Notes (Signed)
Name: Daniel Kirby   MRN: [id number]    DOB: Dec 13, 1945   Date:06/08/2017       Progress Note  Subjective  Chief Complaint  Chief Complaint  Patient presents with  . Hypertension  . Hyperlipidemia  . Hypothyroidism  . Back Pain    taking Meloxicam- not helping that much    Hypertension  This is a chronic problem. The current episode started more than 1 year ago. The problem has been gradually worsening since onset. The problem is controlled. Pertinent negatives include no anxiety, blurred vision, chest pain, headaches, malaise/fatigue, neck pain, orthopnea, palpitations, peripheral edema, PND, shortness of breath or sweats. There are no associated agents to hypertension. There are no known risk factors for coronary artery disease. Past treatments include diuretics and ACE inhibitors. The current treatment provides moderate improvement. There are no compliance problems.  There is no history of angina, kidney disease, CAD/MI, CVA, heart failure, left ventricular hypertrophy, PVD or retinopathy. There is no history of chronic renal disease, a hypertension causing med or renovascular disease.  Hyperlipidemia  This is a chronic problem. The current episode started more than 1 year ago. The problem is controlled. Recent lipid tests were reviewed and are normal. He has no history of chronic renal disease, diabetes, hypothyroidism, liver disease, obesity or nephrotic syndrome. Factors aggravating his hyperlipidemia include thiazides. Pertinent negatives include no chest pain, focal sensory loss, focal weakness, leg pain, myalgias or shortness of breath. Current antihyperlipidemic treatment includes statins. The current treatment provides moderate improvement of lipids. There are no compliance problems.  Risk factors for coronary artery disease include dyslipidemia, hypertension and male sex.  Back Pain  This is a chronic problem. The current episode started more than 1 year ago. The problem occurs  intermittently. The problem has been waxing and waning since onset. The pain is present in the lumbar spine. The quality of the pain is described as aching. The pain is mild. The symptoms are aggravated by standing. Pertinent negatives include no abdominal pain, chest pain, dysuria, fever, headaches, leg pain, tingling or weight loss.    No problem-specific Assessment & Plan notes found for this encounter.   Past Medical History:  Diagnosis Date  . Abdominal pain    RUQ  . Arthritis    right ankle  . ED (erectile dysfunction)   . HLD (hyperlipidemia)   . Hypertension   . Hypothyroid   . Prostate cancer ([facility])   . Vertigo    1 episode only, approx 1 month ago    Past Surgical History:  Procedure Laterality Date  . COLONOSCOPY  2006   normal  . COLONOSCOPY WITH PROPOFOL N/A 09/06/2015   Procedure: COLONOSCOPY WITH PROPOFOL;  Surgeon: Lucilla Lame, MD;  Location: [facility];  Service: Endoscopy;  Laterality: N/A;  . POLYPECTOMY  09/06/2015   Procedure: POLYPECTOMY;  Surgeon: Lucilla Lame, MD;  Location: [facility];  Service: Endoscopy;;  . robotic prostatectomy  2006    Family History  Problem Relation Age of Onset  . Diabetes Unknown   . Heart attack Father   . Kidney disease Neg Hx   . Prostate cancer Neg Hx     Social History   Social History  . Marital status: Married    Spouse name: N/A  . Number of children: N/A  . Years of education: N/A   Occupational History  . Not on file.   Social History Main Topics  . Smoking status: Former Smoker    Types:  Cigars  . Smokeless tobacco: Current User    Types: Snuff     Comment: smokes a cigar every 6 months  . Alcohol use 0.0 oz/week     Comment: occasional, 1-2 drinks per year  . Drug use: No  . Sexual activity: Yes   Other Topics Concern  . Not on file   Social History Narrative  . No narrative on file    No Known Allergies  Outpatient Medications Prior to Visit  Medication Sig Dispense  Refill  . PRESCRIPTION MEDICATION Trimix injection    . benazepril-hydrochlorthiazide (LOTENSIN HCT) 10-12.5 MG tablet Take 1 tablet by mouth daily. 90 tablet 1  . levothyroxine (SYNTHROID, LEVOTHROID) 112 MCG tablet TAKE ONE TABLET BY MOUTH ONCE DAILY CHANGE  TO  THIS  DOSING  DUE  TO  LABS 90 tablet 1  . meloxicam (MOBIC) 7.5 MG tablet Take 1 tablet (7.5 mg total) by mouth daily. 90 tablet 1  . pravastatin (PRAVACHOL) 10 MG tablet Take 1 tablet (10 mg total) by mouth daily. 90 tablet 1  . sertraline (ZOLOFT) 50 MG tablet Take 1 tablet (50 mg total) by mouth daily. 90 tablet 1   No facility-administered medications prior to visit.     Review of Systems  Constitutional: Negative for chills, fever, malaise/fatigue and weight loss.  HENT: Negative for ear discharge, ear pain and sore throat.   Eyes: Negative for blurred vision.  Respiratory: Negative for cough, sputum production, shortness of breath and wheezing.   Cardiovascular: Negative for chest pain, palpitations, orthopnea, leg swelling and PND.  Gastrointestinal: Negative for abdominal pain, blood in stool, constipation, diarrhea, heartburn, melena and nausea.  Genitourinary: Negative for dysuria, frequency, hematuria and urgency.  Musculoskeletal: Positive for back pain. Negative for joint pain, myalgias and neck pain.  Skin: Negative for rash.  Neurological: Negative for dizziness, tingling, sensory change, focal weakness and headaches.  Endo/Heme/Allergies: Negative for environmental allergies and polydipsia. Does not bruise/bleed easily.  Psychiatric/Behavioral: Negative for depression and suicidal ideas. The patient is not nervous/anxious and does not have insomnia.      Objective  Vitals:   06/08/17 0926  BP: 130/64  Pulse: 80  Weight: 222 lb (100.7 kg)  Height: 5\' 7"  (1.702 m)    Physical Exam  Constitutional: He is oriented to person, place, and time and well-developed, well-nourished, and in no distress.  HENT:   Head: Normocephalic.  Right Ear: External ear normal.  Left Ear: External ear normal.  Nose: Nose normal.  Mouth/Throat: Oropharynx is clear and moist.  Eyes: Pupils are equal, round, and reactive to light. Conjunctivae and EOM are normal. Right eye exhibits no discharge. Left eye exhibits no discharge. No scleral icterus.  Neck: Normal range of motion. Neck supple. No JVD present. No tracheal deviation present. No thyromegaly present.  Cardiovascular: Normal rate, regular rhythm, S1 normal, S2 normal, normal heart sounds and intact distal pulses.  Exam reveals no gallop, no S3, no S4 and no friction rub.   No murmur heard. Pulmonary/Chest: Breath sounds normal. No respiratory distress. He has no wheezes. He has no rales.  Abdominal: Soft. Bowel sounds are normal. He exhibits no mass. There is no hepatosplenomegaly. There is no tenderness. There is no rebound, no guarding and no CVA tenderness.  Musculoskeletal: Normal range of motion. He exhibits no edema or tenderness.       Lumbar back: He exhibits spasm.  Lymphadenopathy:    He has no cervical adenopathy.  Neurological: He is alert and oriented  to person, place, and time. He has normal sensation, normal strength, normal reflexes and intact cranial nerves. No cranial nerve deficit.  Skin: Skin is warm. No rash noted.  Psychiatric: Mood and affect normal.  Nursing note and vitals reviewed.     Assessment & Plan  Problem List Items Addressed This Visit      Cardiovascular and Mediastinum   Essential hypertension - Primary   Relevant Medications   benazepril-hydrochlorthiazide (LOTENSIN HCT) 10-12.5 MG tablet   pravastatin (PRAVACHOL) 10 MG tablet   Other Relevant Orders   Renal Function Panel     Endocrine   Adult hypothyroidism   Relevant Medications   levothyroxine (SYNTHROID, LEVOTHROID) 112 MCG tablet   Other Relevant Orders   TSH     Musculoskeletal and Integument   Arthritis   Relevant Medications   meloxicam  (MOBIC) 7.5 MG tablet     Other   Major depressive disorder with single episode, in partial remission (HCC)   Relevant Medications   sertraline (ZOLOFT) 50 MG tablet   Acute anxiety   Relevant Medications   sertraline (ZOLOFT) 50 MG tablet   Mixed hyperlipidemia   Relevant Medications   benazepril-hydrochlorthiazide (LOTENSIN HCT) 10-12.5 MG tablet   pravastatin (PRAVACHOL) 10 MG tablet   Chronic low back pain without sciatica   Relevant Medications   meloxicam (MOBIC) 7.5 MG tablet   Other Relevant Orders   DG Lumbar Spine Complete      Meds ordered this encounter  Medications  . sertraline (ZOLOFT) 50 MG tablet    Sig: Take 1 tablet (50 mg total) by mouth daily.    Dispense:  90 tablet    Refill:  1  . levothyroxine (SYNTHROID, LEVOTHROID) 112 MCG tablet    Sig: TAKE ONE TABLET BY MOUTH ONCE DAILY CHANGE  TO  THIS  DOSING  DUE  TO  LABS    Dispense:  90 tablet    Refill:  1    Please consider 90 day supplies to promote better adherence  . meloxicam (MOBIC) 7.5 MG tablet    Sig: Take 1 tablet (7.5 mg total) by mouth daily.    Dispense:  90 tablet    Refill:  1  . benazepril-hydrochlorthiazide (LOTENSIN HCT) 10-12.5 MG tablet    Sig: Take 1 tablet by mouth daily.    Dispense:  90 tablet    Refill:  1  . pravastatin (PRAVACHOL) 10 MG tablet    Sig: Take 1 tablet (10 mg total) by mouth daily.    Dispense:  90 tablet    Refill:  1    Please consider 90 day supplies to promote better adherence      Dr. Otilio Miu [facility]  06/08/17

## 2017-06-09 LAB — RENAL FUNCTION PANEL
ALBUMIN: 4.4 g/dL (ref 3.5–4.8)
BUN/Creatinine Ratio: 13 (ref 10–24)
BUN: 12 mg/dL (ref 8–27)
CO2: 24 mmol/L (ref 20–29)
CREATININE: 0.91 mg/dL (ref 0.76–1.27)
Calcium: 9.2 mg/dL (ref 8.6–10.2)
Chloride: 96 mmol/L (ref 96–106)
GFR calc Af Amer: 98 mL/min/{1.73_m2} (ref >59)
GFR, EST NON AFRICAN AMERICAN: 84 mL/min/{1.73_m2} (ref >59)
Glucose: 137 mg/dL — ABNORMAL HIGH (ref 65–99)
PHOSPHORUS: 2.7 mg/dL (ref 2.5–4.5)
Potassium: 4 mmol/L (ref 3.5–5.2)
Sodium: 135 mmol/L (ref 134–144)

## 2017-06-09 LAB — TSH: TSH: 2.46 u[IU]/mL (ref 0.450–4.500)

## 2017-06-14 ENCOUNTER — Other Ambulatory Visit: Payer: Self-pay

## 2017-06-14 DIAGNOSIS — M5136 Other intervertebral disc degeneration, lumbar region: Secondary | ICD-10-CM

## 2017-06-25 DIAGNOSIS — G8929 Other chronic pain: Secondary | ICD-10-CM | POA: Diagnosis not present

## 2017-06-25 DIAGNOSIS — E669 Obesity, unspecified: Secondary | ICD-10-CM | POA: Diagnosis not present

## 2017-06-25 DIAGNOSIS — M5136 Other intervertebral disc degeneration, lumbar region: Secondary | ICD-10-CM | POA: Diagnosis not present

## 2017-06-25 DIAGNOSIS — M545 Low back pain: Secondary | ICD-10-CM | POA: Diagnosis not present

## 2017-10-18 DIAGNOSIS — H40003 Preglaucoma, unspecified, bilateral: Secondary | ICD-10-CM | POA: Diagnosis not present

## 2017-11-03 ENCOUNTER — Telehealth: Payer: Self-pay

## 2017-11-03 NOTE — Telephone Encounter (Signed)
Pt called stating he was dx with prostate cancer a couple of years ago and has been tx by [facility]. Pt stated that recently he has developed the inability to control his bladder. Pt stated that when he feels the urge to urinate its typically to late at that point as he has already messed up his clothes. Pt was very persistent about seeing a provider prior to the holidays. Pt was added to Dr. Bernardo Heater for tomorrow afternoon.

## 2017-11-04 ENCOUNTER — Encounter: Payer: Self-pay | Admitting: Urology

## 2017-11-04 ENCOUNTER — Ambulatory Visit (INDEPENDENT_AMBULATORY_CARE_PROVIDER_SITE_OTHER): Payer: Medicare Other | Admitting: Urology

## 2017-11-04 VITALS — BP 160/74 | HR 91 | Ht 67.0 in | Wt 220.0 lb

## 2017-11-04 DIAGNOSIS — N3281 Overactive bladder: Secondary | ICD-10-CM

## 2017-11-04 DIAGNOSIS — R81 Glycosuria: Secondary | ICD-10-CM

## 2017-11-04 LAB — URINALYSIS, COMPLETE
BILIRUBIN UA: NEGATIVE
Ketones, UA: NEGATIVE
Leukocytes, UA: NEGATIVE
NITRITE UA: NEGATIVE
PH UA: 5 (ref 5.0–7.5)
Protein, UA: NEGATIVE
Specific Gravity, UA: 1.01 (ref 1.005–1.030)
UUROB: 0.2 mg/dL (ref 0.2–1.0)

## 2017-11-04 LAB — MICROSCOPIC EXAMINATION
Bacteria, UA: NONE SEEN
EPITHELIAL CELLS (NON RENAL): NONE SEEN /HPF (ref 0–10)
WBC UA: NONE SEEN /HPF (ref 0–?)

## 2017-11-04 LAB — BLADDER SCAN AMB NON-IMAGING

## 2017-11-04 MED ORDER — OXYBUTYNIN CHLORIDE ER 10 MG PO TB24
10.0000 mg | ORAL_TABLET | Freq: Every day | ORAL | 1 refills | Status: DC
Start: 2017-11-04 — End: 2017-12-07

## 2017-11-04 NOTE — Progress Notes (Signed)
11/04/2017 2:49 PM   Daniel Kirby Jul 20, 1946 [id number]  Referring provider: Juline Patch, MD [address] [place], [place] [address]  Chief Complaint  Patient presents with  . Urinary Frequency    HPI: 71 year old male presents for evaluation of lower urinary tract symptoms.  He presents with a 2-week history of urinary frequency, urgency with occasional episodes of urge incontinence.  He voids with a strong urinary stream.  He denies dysuria or gross hematuria.  His voided volumes are estimated as normal.  Past urologic history remarkable for prostate cancer status post radical prostatectomy and adjuvant radiation therapy approximately 10 years ago.  He also has erectile dysfunction and is on intracavernosal injections.   PMH: Past Medical History:  Diagnosis Date  . Abdominal pain    RUQ  . Arthritis    right ankle  . ED (erectile dysfunction)   . HLD (hyperlipidemia)   . Hypertension   . Hypothyroid   . Prostate cancer ([facility])   . Vertigo    1 episode only, approx 1 month ago    Surgical History: Past Surgical History:  Procedure Laterality Date  . COLONOSCOPY  2006   normal  . COLONOSCOPY WITH PROPOFOL N/A 09/06/2015   Procedure: COLONOSCOPY WITH PROPOFOL;  Surgeon: Lucilla Lame, MD;  Location: [facility];  Service: Endoscopy;  Laterality: N/A;  . POLYPECTOMY  09/06/2015   Procedure: POLYPECTOMY;  Surgeon: Lucilla Lame, MD;  Location: [facility];  Service: Endoscopy;;  . robotic prostatectomy  2006    Home Medications:  Allergies as of 11/04/2017   No Known Allergies     Medication List        Accurate as of 11/04/17  2:49 PM. Always use your most recent med list.          benazepril-hydrochlorthiazide 10-12.5 MG tablet Commonly known as:  LOTENSIN HCT Take 1 tablet by mouth daily.   levothyroxine 112 MCG tablet Commonly known as:  SYNTHROID, LEVOTHROID TAKE ONE TABLET BY MOUTH ONCE DAILY CHANGE  TO  THIS  DOSING  DUE   TO  LABS   oxybutynin 10 MG 24 hr tablet Commonly known as:  DITROPAN-XL Take 1 tablet (10 mg total) by mouth daily.   pravastatin 10 MG tablet Commonly known as:  PRAVACHOL Take 1 tablet (10 mg total) by mouth daily.       Allergies: No Known Allergies  Family History: Family History  Problem Relation Age of Onset  . Diabetes Unknown   . Heart attack Father   . Kidney disease Neg Hx   . Prostate cancer Neg Hx     Social History:  reports that he has quit smoking. His smoking use included cigars. His smokeless tobacco use includes snuff. He reports that he drinks alcohol. He reports that he does not use drugs.  ROS: UROLOGY Frequent Urination?: Yes Hard to postpone urination?: Yes Burning/pain with urination?: No Get up at night to urinate?: No Leakage of urine?: No Urine stream starts and stops?: No Trouble starting stream?: No Do you have to strain to urinate?: No Blood in urine?: No Urinary tract infection?: No Sexually transmitted disease?: No Injury to kidneys or bladder?: No Painful intercourse?: No Weak stream?: No Erection problems?: No Penile pain?: No  Gastrointestinal Nausea?: No Vomiting?: No Indigestion/heartburn?: No Diarrhea?: No Constipation?: No  Constitutional Fever: No Night sweats?: No Weight loss?: No Fatigue?: No  Skin Skin rash/lesions?: No Itching?: No  Eyes Blurred vision?: No Double vision?: No  Ears/Nose/Throat Sore throat?: No Sinus problems?: No  Hematologic/Lymphatic Swollen glands?: No Easy bruising?: No  Cardiovascular Leg swelling?: No Chest pain?: No  Respiratory Cough?: No Shortness of breath?: No  Endocrine Excessive thirst?: No  Musculoskeletal Back pain?: No Joint pain?: No  Neurological Headaches?: No Dizziness?: No  Psychologic Depression?: No Anxiety?: No  Physical Exam: BP (!) 160/74   Pulse 91   Ht 5\' 7"  (1.702 m)   Wt 220 lb (99.8 kg)   BMI 34.46 kg/m   Constitutional:   Alert and oriented, No acute distress. HEENT: [place] AT, moist mucus membranes.  Trachea midline, no masses. Cardiovascular: No clubbing, cyanosis, or edema. Respiratory: Normal respiratory effort, no increased work of breathing. GI: Abdomen is soft, nontender, nondistended, no abdominal masses GU: No CVA tenderness.  Skin: No rashes, bruises or suspicious lesions. Lymph: No cervical or inguinal adenopathy. Neurologic: Grossly intact, no focal deficits, moving all 4 extremities. Psychiatric: Normal mood and affect.  Laboratory Data: No results found for: WBC, HGB, HCT, MCV, PLT  Lab Results  Component Value Date   CREATININE 0.91 06/08/2017    Lab Results  Component Value Date   PSA1 <0.1 04/27/2017   PSA1 <0.1 04/29/2016   PSA1 <0.1 06/04/2015    No results found for: TESTOSTERONE  Lab Results  Component Value Date   HGBA1C 5.8 (H) 12/15/2016    Urinalysis Dipstick: 3+ glucose, trace blood Microscopy: Negative   Assessment & Plan:    1. OAB (overactive bladder) PVR by bladder scan was 0 mL.  He has no history of diabetes and urinalysis does show 3+ glucose.  He was sent for a random glucose and if elevated recommend he follow-up with his PCP for further evaluation.  This may be the etiology of his lower urinary tract symptoms.  His symptoms are bothersome and will go ahead and start anticholinergic.  Based on his insurance the only covered medication is oxybutynin.  A prescription for extended release oxybutynin 10 mg was sent to his pharmacy.  If he has persistent symptoms follow-up 2 months with Daniel Kirby.    - Urinalysis, Complete - BLADDER SCAN AMB NON-IMAGING  2. Glycosuria As above. - Urinalysis, Complete - Glucose, random   Return in about 2 months (around 01/05/2018) for Recheck.  Abbie Sons, [place] [address], [place], [place] [address] [phone]

## 2017-11-05 ENCOUNTER — Telehealth: Payer: Self-pay

## 2017-11-05 ENCOUNTER — Other Ambulatory Visit: Payer: Self-pay

## 2017-11-05 ENCOUNTER — Ambulatory Visit (INDEPENDENT_AMBULATORY_CARE_PROVIDER_SITE_OTHER): Payer: Medicare Other | Admitting: Family Medicine

## 2017-11-05 ENCOUNTER — Encounter: Payer: Self-pay | Admitting: Family Medicine

## 2017-11-05 VITALS — BP 120/80 | HR 80 | Ht 67.0 in | Wt 220.0 lb

## 2017-11-05 DIAGNOSIS — E039 Hypothyroidism, unspecified: Secondary | ICD-10-CM | POA: Diagnosis not present

## 2017-11-05 DIAGNOSIS — E785 Hyperlipidemia, unspecified: Secondary | ICD-10-CM

## 2017-11-05 DIAGNOSIS — E119 Type 2 diabetes mellitus without complications: Secondary | ICD-10-CM | POA: Diagnosis not present

## 2017-11-05 LAB — POCT URINALYSIS DIPSTICK
BILIRUBIN UA: NEGATIVE
GLUCOSE UA: 2000
KETONES UA: NEGATIVE
LEUKOCYTES UA: NEGATIVE
Nitrite, UA: NEGATIVE
Protein, UA: NEGATIVE
SPEC GRAV UA: 1.02 (ref 1.010–1.025)
Urobilinogen, UA: 0.2 E.U./dL
pH, UA: 6 (ref 5.0–8.0)

## 2017-11-05 LAB — GLUCOSE, RANDOM: GLUCOSE: 413 mg/dL — AB (ref 65–99)

## 2017-11-05 LAB — POCT CBG (FASTING - GLUCOSE)-MANUAL ENTRY: GLUCOSE FASTING, POC: 394 mg/dL — AB (ref 70–99)

## 2017-11-05 MED ORDER — METFORMIN HCL 500 MG PO TABS: 500.0000 mg | ORAL_TABLET | Freq: Two times a day (BID) | ORAL | 0 refills | Status: DC

## 2017-11-05 NOTE — Telephone Encounter (Signed)
-----   Message from Abbie Sons, MD sent at 11/05/2017  7:20 AM EST ----- Random blood sugar was significantly elevated at 413.  He needs an appointment with Dr. Ronnald Ramp for further evaluation.

## 2017-11-05 NOTE — Patient Instructions (Signed)
Diabetes Mellitus and Nutrition When you have diabetes (diabetes mellitus), it is very important to have healthy eating habits because your blood sugar (glucose) levels are greatly affected by what you eat and drink. Eating healthy foods in the appropriate amounts, at about the same times every day, can help you:  Control your blood glucose.  Lower your risk of heart disease.  Improve your blood pressure.  Reach or maintain a healthy weight.  Every person with diabetes is different, and each person has different needs for a meal plan. Your health care provider may recommend that you work with a diet and nutrition specialist (dietitian) to make a meal plan that is best for you. Your meal plan may vary depending on factors such as:  The calories you need.  The medicines you take.  Your weight.  Your blood glucose, blood pressure, and cholesterol levels.  Your activity level.  Other health conditions you have, such as heart or kidney disease.  How do carbohydrates affect me? Carbohydrates affect your blood glucose level more than any other type of food. Eating carbohydrates naturally increases the amount of glucose in your blood. Carbohydrate counting is a method for keeping track of how many carbohydrates you eat. Counting carbohydrates is important to keep your blood glucose at a healthy level, especially if you use insulin or take certain oral diabetes medicines. It is important to know how many carbohydrates you can safely have in each meal. This is different for every person. Your dietitian can help you calculate how many carbohydrates you should have at each meal and for snack. Foods that contain carbohydrates include:  Bread, cereal, rice, pasta, and crackers.  Potatoes and corn.  Peas, beans, and lentils.  Milk and yogurt.  Fruit and juice.  Desserts, such as cakes, cookies, ice cream, and candy.  How does alcohol affect me? Alcohol can cause a sudden decrease in blood  glucose (hypoglycemia), especially if you use insulin or take certain oral diabetes medicines. Hypoglycemia can be a life-threatening condition. Symptoms of hypoglycemia (sleepiness, dizziness, and confusion) are similar to symptoms of having too much alcohol. If your health care provider says that alcohol is safe for you, follow these guidelines:  Limit alcohol intake to no more than 1 drink per day for nonpregnant women and 2 drinks per day for men. One drink equals 12 oz of beer, 5 oz of wine, or 1 oz of hard liquor.  Do not drink on an empty stomach.  Keep yourself hydrated with water, diet soda, or unsweetened iced tea.  Keep in mind that regular soda, juice, and other mixers may contain a lot of sugar and must be counted as carbohydrates.  What are tips for following this plan? Reading food labels  Start by checking the serving size on the label. The amount of calories, carbohydrates, fats, and other nutrients listed on the label are based on one serving of the food. Many foods contain more than one serving per package.  Check the total grams (g) of carbohydrates in one serving. You can calculate the number of servings of carbohydrates in one serving by dividing the total carbohydrates by 15. For example, if a food has 30 g of total carbohydrates, it would be equal to 2 servings of carbohydrates.  Check the number of grams (g) of saturated and trans fats in one serving. Choose foods that have low or no amount of these fats.  Check the number of milligrams (mg) of sodium in one serving. Most people   should limit total sodium intake to less than 2,300 mg per day.  Always check the nutrition information of foods labeled as "low-fat" or "nonfat". These foods may be higher in added sugar or refined carbohydrates and should be avoided.  Talk to your dietitian to identify your daily goals for nutrients listed on the label. Shopping  Avoid buying canned, premade, or processed foods. These  foods tend to be high in fat, sodium, and added sugar.  Shop around the outside edge of the grocery store. This includes fresh fruits and vegetables, bulk grains, fresh meats, and fresh dairy. Cooking  Use low-heat cooking methods, such as baking, instead of high-heat cooking methods like deep frying.  Cook using healthy oils, such as olive, canola, or sunflower oil.  Avoid cooking with butter, cream, or high-fat meats. Meal planning  Eat meals and snacks regularly, preferably at the same times every day. Avoid going long periods of time without eating.  Eat foods high in fiber, such as fresh fruits, vegetables, beans, and whole grains. Talk to your dietitian about how many servings of carbohydrates you can eat at each meal.  Eat 4-6 ounces of lean protein each day, such as lean meat, chicken, fish, eggs, or tofu. 1 ounce is equal to 1 ounce of meat, chicken, or fish, 1 egg, or 1/4 cup of tofu.  Eat some foods each day that contain healthy fats, such as avocado, nuts, seeds, and fish. Lifestyle   Check your blood glucose regularly.  Exercise at least 30 minutes 5 or more days each week, or as told by your health care provider.  Take medicines as told by your health care provider.  Do not use any products that contain nicotine or tobacco, such as cigarettes and e-cigarettes. If you need help quitting, ask your health care provider.  Work with a counselor or diabetes educator to identify strategies to manage stress and any emotional and social challenges. What are some questions to ask my health care provider?  Do I need to meet with a diabetes educator?  Do I need to meet with a dietitian?  What number can I call if I have questions?  When are the best times to check my blood glucose? Where to find more information:  American Diabetes Association: diabetes.org/food-and-fitness/food  Academy of Nutrition and Dietetics:  www.eatright.org/resources/health/diseases-and-conditions/diabetes  National Institute of Diabetes and Digestive and Kidney Diseases (NIH): www.niddk.nih.gov/health-information/diabetes/overview/diet-eating-physical-activity Summary  A healthy meal plan will help you control your blood glucose and maintain a healthy lifestyle.  Working with a diet and nutrition specialist (dietitian) can help you make a meal plan that is best for you.  Keep in mind that carbohydrates and alcohol have immediate effects on your blood glucose levels. It is important to count carbohydrates and to use alcohol carefully. This information is not intended to replace advice given to you by your health care provider. Make sure you discuss any questions you have with your health care provider. Document Released: 07/30/2005 Document Revised: 12/07/2016 Document Reviewed: 12/07/2016 Elsevier Interactive Patient Education  2018 [employer]  

## 2017-11-05 NOTE — Progress Notes (Signed)
Name: Daniel Kirby   MRN: [id number]    DOB: 01-16-1946   Date:11/05/2017       Progress Note  Subjective  Chief Complaint  Chief Complaint  Patient presents with  . Diabetes    had BS yesterday of 413 and today of 430.     Diabetes  He presents for his follow-up diabetic visit. He has type 2 diabetes mellitus. His disease course has been stable. Hypoglycemia symptoms include dizziness. Pertinent negatives for hypoglycemia include no headaches, nervousness/anxiousness, seizures or tremors. Associated symptoms include polydipsia and polyuria. Pertinent negatives for diabetes include no blurred vision, no chest pain, no fatigue, no foot paresthesias, no foot ulcerations, no polyphagia, no visual change, no weakness and no weight loss. There are no hypoglycemic complications. Symptoms are worsening. There are no diabetic complications. Risk factors for coronary artery disease include diabetes mellitus, dyslipidemia and hypertension. When asked about current treatments, none were reported. He is compliant with treatment most of the time. There is no change in his home blood glucose trend.    No problem-specific Assessment & Plan notes found for this encounter.   Past Medical History:  Diagnosis Date  . Abdominal pain    RUQ  . Arthritis    right ankle  . ED (erectile dysfunction)   . HLD (hyperlipidemia)   . Hypertension   . Hypothyroid   . Prostate cancer ([place])   . Vertigo    1 episode only, approx 1 month ago    Past Surgical History:  Procedure Laterality Date  . COLONOSCOPY  2006   normal  . COLONOSCOPY WITH PROPOFOL N/A 09/06/2015   Procedure: COLONOSCOPY WITH PROPOFOL;  Surgeon: Lucilla Lame, MD;  Location: [place];  Service: Endoscopy;  Laterality: N/A;  . POLYPECTOMY  09/06/2015   Procedure: POLYPECTOMY;  Surgeon: Lucilla Lame, MD;  Location: [place];  Service: Endoscopy;;  . robotic prostatectomy  2006    Family History  Problem Relation Age  of Onset  . Diabetes Unknown   . Heart attack Father   . Kidney disease Neg Hx   . Prostate cancer Neg Hx     Social History   Socioeconomic History  . Marital status: Married    Spouse name: Not on file  . Number of children: Not on file  . Years of education: Not on file  . Highest education level: Not on file  Social Needs  . Financial resource strain: Not on file  . Food insecurity - worry: Not on file  . Food insecurity - inability: Not on file  . Transportation needs - medical: Not on file  . Transportation needs - non-medical: Not on file  Occupational History  . Not on file  Tobacco Use  . Smoking status: Former Smoker    Types: Cigars  . Smokeless tobacco: Current User    Types: Snuff  . Tobacco comment: smokes a cigar every 6 months  Substance and Sexual Activity  . Alcohol use: Yes    Alcohol/week: 0.0 oz    Comment: occasional, 1-2 drinks per year  . Drug use: No  . Sexual activity: Yes  Other Topics Concern  . Not on file  Social History Narrative  . Not on file    No Known Allergies  Outpatient Medications Prior to Visit  Medication Sig Dispense Refill  . benazepril-hydrochlorthiazide (LOTENSIN HCT) 10-12.5 MG tablet Take 1 tablet by mouth daily. 90 tablet 1  . levothyroxine (SYNTHROID, LEVOTHROID) 112 MCG tablet TAKE ONE TABLET  BY MOUTH ONCE DAILY CHANGE  TO  THIS  DOSING  DUE  TO  LABS 90 tablet 1  . metFORMIN (GLUCOPHAGE) 500 MG tablet Take 1 tablet (500 mg total) by mouth 2 (two) times daily with a meal. 60 tablet 0  . oxybutynin (DITROPAN-XL) 10 MG 24 hr tablet Take 1 tablet (10 mg total) by mouth daily. 30 tablet 1  . pravastatin (PRAVACHOL) 10 MG tablet Take 1 tablet (10 mg total) by mouth daily. 90 tablet 1   No facility-administered medications prior to visit.     Review of Systems  Constitutional: Negative for chills, diaphoresis, fatigue, fever, malaise/fatigue and weight loss.  HENT: Negative for ear discharge, ear pain and sore  throat.   Eyes: Negative for blurred vision and photophobia.  Respiratory: Negative for cough, sputum production, shortness of breath and wheezing.   Cardiovascular: Negative for chest pain, palpitations and leg swelling.  Gastrointestinal: Negative for abdominal pain, blood in stool, constipation, diarrhea, heartburn, melena and nausea.  Genitourinary: Negative for dysuria, frequency, hematuria and urgency.  Musculoskeletal: Negative for back pain, joint pain, myalgias and neck pain.  Skin: Negative for rash.  Neurological: Positive for dizziness and tingling. Negative for tremors, sensory change, speech change, focal weakness, seizures, loss of consciousness, weakness and headaches.  Endo/Heme/Allergies: Positive for polydipsia. Negative for environmental allergies and polyphagia. Does not bruise/bleed easily.  Psychiatric/Behavioral: Negative for depression and suicidal ideas. The patient is not nervous/anxious and does not have insomnia.      Objective  Vitals:   11/05/17 1336  BP: 120/80  Pulse: 80  Weight: 220 lb (99.8 kg)  Height: 5\' 7"  (1.702 m)    Physical Exam  Constitutional: He is oriented to person, place, and time and well-developed, well-nourished, and in no distress.  HENT:  Head: Normocephalic.  Right Ear: External ear normal.  Left Ear: External ear normal.  Nose: Nose normal.  Mouth/Throat: Oropharynx is clear and moist.  Eyes: Conjunctivae and EOM are normal. Pupils are equal, round, and reactive to light. Right eye exhibits no discharge. Left eye exhibits no discharge. No scleral icterus.  Neck: Normal range of motion. Neck supple. No JVD present. No tracheal deviation present. No thyromegaly present.  Cardiovascular: Normal rate, regular rhythm, normal heart sounds and intact distal pulses. Exam reveals no gallop and no friction rub.  No murmur heard. Pulmonary/Chest: Breath sounds normal. No respiratory distress. He has no wheezes. He has no rales.   Abdominal: Soft. Bowel sounds are normal. He exhibits no mass. There is no hepatosplenomegaly. There is no tenderness. There is no rebound, no guarding and no CVA tenderness.  Musculoskeletal: Normal range of motion. He exhibits no edema or tenderness.  Lymphadenopathy:    He has no cervical adenopathy.  Neurological: He is alert and oriented to person, place, and time. He has normal sensation, normal strength, normal reflexes and intact cranial nerves. No cranial nerve deficit.  Skin: Skin is warm. No rash noted.  Psychiatric: Mood and affect normal.  Nursing note and vitals reviewed.     Assessment & Plan  Problem List Items Addressed This Visit      Endocrine   Adult hypothyroidism   Relevant Orders   TSH    Other Visit Diagnoses    Type 2 diabetes mellitus without complication, without long-term current use of insulin ([place])    -  Primary   script for glucometer,lancets, and strips   Relevant Orders   POCT CBG (Fasting - Glucose)   POCT urinalysis  dipstick   Renal Function Panel   Hemoglobin A1c   Hyperlipidemia, unspecified hyperlipidemia type          No orders of the defined types were placed in this encounter.     Dr. [facility]  11/05/17

## 2017-11-05 NOTE — Telephone Encounter (Signed)
Patient notified

## 2017-11-06 LAB — HEMOGLOBIN A1C
Est. average glucose Bld gHb Est-mCnc: 209 mg/dL
HEMOGLOBIN A1C: 8.9 % — AB (ref 4.8–5.6)

## 2017-11-06 LAB — RENAL FUNCTION PANEL
ALBUMIN: 4.5 g/dL (ref 3.5–4.8)
BUN/Creatinine Ratio: 17 (ref 10–24)
BUN: 17 mg/dL (ref 8–27)
CALCIUM: 9.9 mg/dL (ref 8.6–10.2)
CHLORIDE: 96 mmol/L (ref 96–106)
CO2: 21 mmol/L (ref 20–29)
Creatinine, Ser: 1.03 mg/dL (ref 0.76–1.27)
GFR calc Af Amer: 84 mL/min/{1.73_m2} (ref >59)
GFR calc non Af Amer: 73 mL/min/{1.73_m2} (ref >59)
Glucose: 362 mg/dL — ABNORMAL HIGH (ref 65–99)
Phosphorus: 2.6 mg/dL (ref 2.5–4.5)
Potassium: 4.5 mmol/L (ref 3.5–5.2)
Sodium: 134 mmol/L (ref 134–144)

## 2017-11-06 LAB — TSH: TSH: 5.2 u[IU]/mL — AB (ref 0.450–4.500)

## 2017-11-10 ENCOUNTER — Ambulatory Visit (INDEPENDENT_AMBULATORY_CARE_PROVIDER_SITE_OTHER): Payer: Medicare Other | Admitting: Family Medicine

## 2017-11-10 ENCOUNTER — Encounter: Payer: Self-pay | Admitting: Family Medicine

## 2017-11-10 ENCOUNTER — Ambulatory Visit: Payer: Medicare Other

## 2017-11-10 VITALS — BP 120/70 | HR 80 | Ht 67.0 in | Wt 220.0 lb

## 2017-11-10 VITALS — BP 120/70 | HR 80 | Temp 97.8°F | Resp 16 | Ht 67.0 in | Wt 220.0 lb

## 2017-11-10 DIAGNOSIS — E039 Hypothyroidism, unspecified: Secondary | ICD-10-CM | POA: Diagnosis not present

## 2017-11-10 DIAGNOSIS — Z0000 Encounter for general adult medical examination without abnormal findings: Secondary | ICD-10-CM

## 2017-11-10 DIAGNOSIS — E119 Type 2 diabetes mellitus without complications: Secondary | ICD-10-CM

## 2017-11-10 DIAGNOSIS — E782 Mixed hyperlipidemia: Secondary | ICD-10-CM

## 2017-11-10 MED ORDER — DAPAGLIFLOZIN PROPANEDIOL 5 MG PO TABS: 5.0000 mg | ORAL_TABLET | Freq: Every day | ORAL | 5 refills | Status: DC

## 2017-11-10 MED ORDER — METFORMIN HCL 500 MG PO TABS: 500.0000 mg | ORAL_TABLET | Freq: Two times a day (BID) | ORAL | 0 refills | Status: DC

## 2017-11-10 MED ORDER — LEVOTHYROXINE SODIUM 112 MCG PO TABS: 112.0000 ug | ORAL_TABLET | Freq: Every day | ORAL | 3 refills | Status: DC

## 2017-11-10 MED ORDER — PRAVASTATIN SODIUM 10 MG PO TABS: 10.0000 mg | ORAL_TABLET | Freq: Every day | ORAL | 1 refills | Status: DC

## 2017-11-10 NOTE — Progress Notes (Signed)
Name: Daniel Kirby   MRN: [id number]    DOB: [birth year]   Date:11/10/2017       Progress Note  Subjective  Chief Complaint  Chief Complaint  Patient presents with  . Diabetes    avg 211    Diabetes  He presents for his follow-up diabetic visit. He has type 2 diabetes mellitus. His disease course has been improving. There are no hypoglycemic associated symptoms. Pertinent negatives for hypoglycemia include no dizziness, headaches, nervousness/anxiousness or tremors. Pertinent negatives for diabetes include no blurred vision, no chest pain, no fatigue, no foot paresthesias, no foot ulcerations, no polydipsia, no polyphagia, no polyuria, no visual change, no weakness and no weight loss. There are no hypoglycemic complications. Symptoms are stable. Pertinent negatives for diabetic complications include no autonomic neuropathy, CVA, heart disease, impotence, nephropathy, peripheral neuropathy or PVD. Risk factors for coronary artery disease include diabetes mellitus and hypertension. Current diabetic treatment includes oral agent (monotherapy). He is compliant with treatment most of the time. His weight is stable. He is following a generally healthy diet. He participates in exercise intermittently. His breakfast blood glucose is taken between 8-9 am. His lunch blood glucose range is generally 180-200 mg/dl. An ACE inhibitor/angiotensin II receptor blocker is being taken. He does not see a podiatrist.Eye exam is not current (appt january).  Thyroid Problem  Presents for follow-up visit. Patient reports no anxiety, cold intolerance, constipation, depressed mood, diaphoresis, diarrhea, dry skin, fatigue, hair loss, heat intolerance, hoarse voice, leg swelling, menstrual problem, nail problem, palpitations, tremors, visual change, weight gain or weight loss. The symptoms have been stable.    No problem-specific Assessment & Plan notes found for this encounter.   Past Medical History:  Diagnosis Date   . Abdominal pain    RUQ  . Arthritis    right ankle  . ED (erectile dysfunction)   . HLD (hyperlipidemia)   . Hypertension   . Hypothyroid   . Prostate cancer ([place])   . Vertigo    1 episode only, approx 1 month ago    Past Surgical History:  Procedure Laterality Date  . COLONOSCOPY  2006   normal  . COLONOSCOPY WITH PROPOFOL N/A 09/06/2015   Procedure: COLONOSCOPY WITH PROPOFOL;  Surgeon: Lucilla Lame, MD;  Location: [place];  Service: Endoscopy;  Laterality: N/A;  . POLYPECTOMY  09/06/2015   Procedure: POLYPECTOMY;  Surgeon: Lucilla Lame, MD;  Location: [place];  Service: Endoscopy;;  . robotic prostatectomy  2006    Family History  Problem Relation Age of Onset  . Heart attack Father   . Diabetes Mother   . Kidney disease Brother     Social History   Socioeconomic History  . Marital status: Legally Separated    Spouse name: Not on file  . Number of children: 3  . Years of education: Not on file  . Highest education level: 12th grade  Social Needs  . Financial resource strain: Not hard at all  . Food insecurity - worry: Never true  . Food insecurity - inability: Never true  . Transportation needs - medical: No  . Transportation needs - non-medical: No  Occupational History  . Occupation: Retired  Tobacco Use  . Smoking status: Former Smoker    Packs/day: 1.00    Years: 16.00    Pack years: 16.00    Types: Cigars  . Smokeless tobacco: Current User    Types: Snuff  . Tobacco comment: Quit 2007. Smoking cessationmaterials not required  Substance  and Sexual Activity  . Alcohol use: Yes    Alcohol/week: 0.0 oz    Comment: occasional, 1-2 drinks per year  . Drug use: No  . Sexual activity: Yes  Other Topics Concern  . Not on file  Social History Narrative  . Not on file    No Known Allergies  Outpatient Medications Prior to Visit  Medication Sig Dispense Refill  . benazepril-hydrochlorthiazide (LOTENSIN HCT) 10-12.5 MG tablet  Take 1 tablet by mouth daily. 90 tablet 1  . oxybutynin (DITROPAN-XL) 10 MG 24 hr tablet Take 1 tablet (10 mg total) by mouth daily. 30 tablet 1  . levothyroxine (SYNTHROID, LEVOTHROID) 112 MCG tablet TAKE ONE TABLET BY MOUTH ONCE DAILY CHANGE  TO  THIS  DOSING  DUE  TO  LABS 90 tablet 1  . metFORMIN (GLUCOPHAGE) 500 MG tablet Take 1 tablet (500 mg total) by mouth 2 (two) times daily with a meal. 60 tablet 0  . pravastatin (PRAVACHOL) 10 MG tablet Take 1 tablet (10 mg total) by mouth daily. 90 tablet 1   No facility-administered medications prior to visit.     Review of Systems  Constitutional: Negative for chills, diaphoresis, fatigue, fever, malaise/fatigue, weight gain and weight loss.  HENT: Negative for ear discharge, ear pain, hoarse voice and sore throat.   Eyes: Negative for blurred vision.  Respiratory: Negative for cough, sputum production, shortness of breath and wheezing.   Cardiovascular: Negative for chest pain, palpitations and leg swelling.  Gastrointestinal: Negative for abdominal pain, blood in stool, constipation, diarrhea, heartburn, melena and nausea.  Genitourinary: Negative for dysuria, frequency, hematuria, impotence, menstrual problem and urgency.  Musculoskeletal: Negative for back pain, joint pain, myalgias and neck pain.  Skin: Negative for rash.  Neurological: Negative for dizziness, tingling, tremors, sensory change, focal weakness, weakness and headaches.  Endo/Heme/Allergies: Negative for environmental allergies, cold intolerance, heat intolerance, polydipsia and polyphagia. Does not bruise/bleed easily.  Psychiatric/Behavioral: Negative for depression and suicidal ideas. The patient is not nervous/anxious and does not have insomnia.      Objective  Vitals:   11/10/17 0806  BP: 120/70  Pulse: 80  Weight: 220 lb (99.8 kg)  Height: 5\' 7"  (1.702 m)    Physical Exam  Constitutional: He is oriented to person, place, and time and well-developed,  well-nourished, and in no distress.  HENT:  Head: Normocephalic.  Right Ear: External ear normal.  Left Ear: External ear normal.  Nose: Nose normal.  Mouth/Throat: Oropharynx is clear and moist.  Eyes: Conjunctivae and EOM are normal. Pupils are equal, round, and reactive to light. Right eye exhibits no discharge. Left eye exhibits no discharge. No scleral icterus.  Neck: Normal range of motion. Neck supple. No JVD present. No tracheal deviation present. No thyromegaly present.  Cardiovascular: Normal rate, regular rhythm, normal heart sounds and intact distal pulses. Exam reveals no gallop and no friction rub.  No murmur heard. Pulmonary/Chest: Breath sounds normal. No respiratory distress. He has no wheezes. He has no rales.  Abdominal: Soft. Bowel sounds are normal. He exhibits no mass. There is no hepatosplenomegaly. There is no tenderness. There is no rebound, no guarding and no CVA tenderness.  Musculoskeletal: Normal range of motion. He exhibits no edema or tenderness.  Lymphadenopathy:    He has no cervical adenopathy.  Neurological: He is alert and oriented to person, place, and time. He has normal sensation, normal strength and intact cranial nerves. No cranial nerve deficit.  Skin: Skin is warm. No rash noted.  Psychiatric: Mood and affect normal.  Nursing note and vitals reviewed.     Assessment & Plan  Problem List Items Addressed This Visit      Endocrine   Adult hypothyroidism   Relevant Medications   levothyroxine (SYNTHROID, LEVOTHROID) 112 MCG tablet     Other   Mixed hyperlipidemia   Relevant Medications   pravastatin (PRAVACHOL) 10 MG tablet    Other Visit Diagnoses    Type 2 diabetes mellitus without complication, without long-term current use of insulin ([facility])    -  Primary   add farxiga 5 mg   Relevant Medications   metFORMIN (GLUCOPHAGE) 500 MG tablet   pravastatin (PRAVACHOL) 10 MG tablet   dapagliflozin propanediol (FARXIGA) 5 MG TABS tablet       Meds ordered this encounter  Medications  . metFORMIN (GLUCOPHAGE) 500 MG tablet    Sig: Take 1 tablet (500 mg total) by mouth 2 (two) times daily with a meal.    Dispense:  60 tablet    Refill:  0  . pravastatin (PRAVACHOL) 10 MG tablet    Sig: Take 1 tablet (10 mg total) by mouth daily.    Dispense:  90 tablet    Refill:  1    Please consider 90 day supplies to promote better adherence  . levothyroxine (SYNTHROID, LEVOTHROID) 112 MCG tablet    Sig: Take 1 tablet (112 mcg total) by mouth daily.    Dispense:  90 tablet    Refill:  3  . dapagliflozin propanediol (FARXIGA) 5 MG TABS tablet    Sig: Take 5 mg by mouth daily.    Dispense:  30 tablet    Refill:  5      Dr. Otilio Miu [facility]  11/10/17

## 2017-11-10 NOTE — Patient Instructions (Signed)
Daniel Kirby , Thank you for taking time to come for your Medicare Wellness Visit. I appreciate your ongoing commitment to your health goals. Please review the following plan we discussed and let me know if I can assist you in the future.   Screening recommendations/referrals: Colonoscopy: Completed 09/06/15. Repeat colonoscopy every 10 years Recommended yearly ophthalmology/optometry visit for glaucoma screening and checkup Recommended yearly dental visit for hygiene and checkup  Vaccinations: Influenza vaccine: Declined Pneumococcal vaccine: Completed series Tdap vaccine: Up to date Shingles vaccine: Declined. Please call your insurance company to determine your out of pocket expense. You may also receive this vaccine at your local pharmacy or Health Dept.  Advanced directives: Advance directive discussed with you today. I have provided a copy for you to complete at home and have notarized. Once this is complete please bring a copy in to our office so we can scan it into your chart.  Conditions/risks identified: .Recommend to exercise at least 150 minutes per week  Next appointment: You are scheduled to see Dr. Ronnald Ramp on 12/07/17 @ 9:00am.   Please schedule your Annual Wellness Visit with your Nurse Health Advisor in one year.   Preventive Care 33 Years and Older, Male Preventive care refers to lifestyle choices and visits with your health care provider that can promote health and wellness. What does preventive care include?  A yearly physical exam. This is also called an annual well check.  Dental exams once or twice a year.  Routine eye exams. Ask your health care provider how often you should have your eyes checked.  Personal lifestyle choices, including:  Daily care of your teeth and gums.  Regular physical activity.  Eating a healthy diet.  Avoiding tobacco and drug use.  Limiting alcohol use.  Practicing safe sex.  Taking low doses of aspirin every day.  Taking  vitamin and mineral supplements as recommended by your health care provider. What happens during an annual well check? The services and screenings done by your health care provider during your annual well check will depend on your age, overall health, lifestyle risk factors, and family history of disease. Counseling  Your health care provider may ask you questions about your:  Alcohol use.  Tobacco use.  Drug use.  Emotional well-being.  Home and relationship well-being.  Sexual activity.  Eating habits.  History of falls.  Memory and ability to understand (cognition).  Work and work Statistician. Screening  You may have the following tests or measurements:  Height, weight, and BMI.  Blood pressure.  Lipid and cholesterol levels. These may be checked every 5 years, or more frequently if you are over 51 years old.  Skin check.  Lung cancer screening. You may have this screening every year starting at age 34 if you have a 30-pack-year history of smoking and currently smoke or have quit within the past 15 years.  Fecal occult blood test (FOBT) of the stool. You may have this test every year starting at age 23.  Flexible sigmoidoscopy or colonoscopy. You may have a sigmoidoscopy every 5 years or a colonoscopy every 10 years starting at age 42.  Prostate cancer screening. Recommendations will vary depending on your family history and other risks.  Hepatitis C blood test.  Hepatitis B blood test.  Sexually transmitted disease (STD) testing.  Diabetes screening. This is done by checking your blood sugar (glucose) after you have not eaten for a while (fasting). You may have this done every 1-3 years.  Abdominal aortic aneurysm (  AAA) screening. You may need this if you are a current or former smoker.  Osteoporosis. You may be screened starting at age 33 if you are at high risk. Talk with your health care provider about your test results, treatment options, and if  necessary, the need for more tests. Vaccines  Your health care provider may recommend certain vaccines, such as:  Influenza vaccine. This is recommended every year.  Tetanus, diphtheria, and acellular pertussis (Tdap, Td) vaccine. You may need a Td booster every 10 years.  Zoster vaccine. You may need this after age 47.  Pneumococcal 13-valent conjugate (PCV13) vaccine. One dose is recommended after age 20.  Pneumococcal polysaccharide (PPSV23) vaccine. One dose is recommended after age 67. Talk to your health care provider about which screenings and vaccines you need and how often you need them. This information is not intended to replace advice given to you by your health care provider. Make sure you discuss any questions you have with your health care provider. Document Released: 11/29/2015 Document Revised: 07/22/2016 Document Reviewed: 09/03/2015 Elsevier Interactive Patient Education  2017 [place] Prevention in the Home Falls can cause injuries. They can happen to people of all ages. There are many things you can do to make your home safe and to help prevent falls. What can I do on the outside of my home?  Regularly fix the edges of walkways and driveways and fix any cracks.  Remove anything that might make you trip as you walk through a door, such as a raised step or threshold.  Trim any bushes or trees on the path to your home.  Use bright outdoor lighting.  Clear any walking paths of anything that might make someone trip, such as rocks or tools.  Regularly check to see if handrails are loose or broken. Make sure that both sides of any steps have handrails.  Any raised decks and porches should have guardrails on the edges.  Have any leaves, snow, or ice cleared regularly.  Use sand or salt on walking paths during winter.  Clean up any spills in your garage right away. This includes oil or grease spills. What can I do in the bathroom?  Use night  lights.  Install grab bars by the toilet and in the tub and shower. Do not use towel bars as grab bars.  Use non-skid mats or decals in the tub or shower.  If you need to sit down in the shower, use a plastic, non-slip stool.  Keep the floor dry. Clean up any water that spills on the floor as soon as it happens.  Remove soap buildup in the tub or shower regularly.  Attach bath mats securely with double-sided non-slip rug tape.  Do not have throw rugs and other things on the floor that can make you trip. What can I do in the bedroom?  Use night lights.  Make sure that you have a light by your bed that is easy to reach.  Do not use any sheets or blankets that are too big for your bed. They should not hang down onto the floor.  Have a firm chair that has side arms. You can use this for support while you get dressed.  Do not have throw rugs and other things on the floor that can make you trip. What can I do in the kitchen?  Clean up any spills right away.  Avoid walking on wet floors.  Keep items that you use a lot in  easy-to-reach places.  If you need to reach something above you, use a strong step stool that has a grab bar.  Keep electrical cords out of the way.  Do not use floor polish or wax that makes floors slippery. If you must use wax, use non-skid floor wax.  Do not have throw rugs and other things on the floor that can make you trip. What can I do with my stairs?  Do not leave any items on the stairs.  Make sure that there are handrails on both sides of the stairs and use them. Fix handrails that are broken or loose. Make sure that handrails are as long as the stairways.  Check any carpeting to make sure that it is firmly attached to the stairs. Fix any carpet that is loose or worn.  Avoid having throw rugs at the top or bottom of the stairs. If you do have throw rugs, attach them to the floor with carpet tape.  Make sure that you have a light switch at the  top of the stairs and the bottom of the stairs. If you do not have them, ask someone to add them for you. What else can I do to help prevent falls?  Wear shoes that:  Do not have high heels.  Have rubber bottoms.  Are comfortable and fit you well.  Are closed at the toe. Do not wear sandals.  If you use a stepladder:  Make sure that it is fully opened. Do not climb a closed stepladder.  Make sure that both sides of the stepladder are locked into place.  Ask someone to hold it for you, if possible.  Clearly mark and make sure that you can see:  Any grab bars or handrails.  First and last steps.  Where the edge of each step is.  Use tools that help you move around (mobility aids) if they are needed. These include:  Canes.  Walkers.  Scooters.  Crutches.  Turn on the lights when you go into a dark area. Replace any light bulbs as soon as they burn out.  Set up your furniture so you have a clear path. Avoid moving your furniture around.  If any of your floors are uneven, fix them.  If there are any pets around you, be aware of where they are.  Review your medicines with your doctor. Some medicines can make you feel dizzy. This can increase your chance of falling. Ask your doctor what other things that you can do to help prevent falls. This information is not intended to replace advice given to you by your health care provider. Make sure you discuss any questions you have with your health care provider. Document Released: 08/29/2009 Document Revised: 04/09/2016 Document Reviewed: 12/07/2014 Elsevier Interactive Patient Education  2017 [employer].

## 2017-11-10 NOTE — Progress Notes (Signed)
Subjective:   Daniel Kirby is a 71 y.o. male who presents for an Initial Medicare Annual Wellness Visit.  Review of Systems  N/A      Objective:    There were no vitals filed for this visit. There is no height or weight on file to calculate BMI.  Advanced Directives 09/06/2015 07/24/2015  Does Patient Have a Medical Advance Directive? No No  Would patient like information on creating a medical advance directive? No - patient declined information No - patient declined information    Current Medications (verified) Outpatient Encounter Medications as of 11/10/2017  Medication Sig  . benazepril-hydrochlorthiazide (LOTENSIN HCT) 10-12.5 MG tablet Take 1 tablet by mouth daily.  Marland Kitchen levothyroxine (SYNTHROID, LEVOTHROID) 112 MCG tablet Take 1 tablet (112 mcg total) by mouth daily.  . metFORMIN (GLUCOPHAGE) 500 MG tablet Take 1 tablet (500 mg total) by mouth 2 (two) times daily with a meal.  . oxybutynin (DITROPAN-XL) 10 MG 24 hr tablet Take 1 tablet (10 mg total) by mouth daily.  . pravastatin (PRAVACHOL) 10 MG tablet Take 1 tablet (10 mg total) by mouth daily.   No facility-administered encounter medications on file as of 11/10/2017.     Allergies (verified) Patient has no known allergies.   History: Past Medical History:  Diagnosis Date  . Abdominal pain    RUQ  . Arthritis    right ankle  . ED (erectile dysfunction)   . HLD (hyperlipidemia)   . Hypertension   . Hypothyroid   . Prostate cancer ([place])   . Vertigo    1 episode only, approx 1 month ago   Past Surgical History:  Procedure Laterality Date  . COLONOSCOPY  2006   normal  . COLONOSCOPY WITH PROPOFOL N/A 09/06/2015   Procedure: COLONOSCOPY WITH PROPOFOL;  Surgeon: Lucilla Lame, MD;  Location: [facility];  Service: Endoscopy;  Laterality: N/A;  . POLYPECTOMY  09/06/2015   Procedure: POLYPECTOMY;  Surgeon: Lucilla Lame, MD;  Location: [facility];  Service: Endoscopy;;  . robotic prostatectomy   2006   Family History  Problem Relation Age of Onset  . Diabetes Unknown   . Heart attack Father   . Kidney disease Neg Hx   . Prostate cancer Neg Hx    Social History   Socioeconomic History  . Marital status: Married    Spouse name: Not on file  . Number of children: Not on file  . Years of education: Not on file  . Highest education level: Not on file  Social Needs  . Financial resource strain: Not on file  . Food insecurity - worry: Not on file  . Food insecurity - inability: Not on file  . Transportation needs - medical: Not on file  . Transportation needs - non-medical: Not on file  Occupational History  . Not on file  Tobacco Use  . Smoking status: Former Smoker    Types: Cigars  . Smokeless tobacco: Current User    Types: Snuff  . Tobacco comment: smokes a cigar every 6 months  Substance and Sexual Activity  . Alcohol use: Yes    Alcohol/week: 0.0 oz    Comment: occasional, 1-2 drinks per year  . Drug use: No  . Sexual activity: Yes  Other Topics Concern  . Not on file  Social History Narrative  . Not on file   Tobacco Counseling Ready to quit: Not Answered Counseling given: Not Answered Comment: smokes a cigar every 6 months   Clinical Intake:  Activities of Daily Living No flowsheet data found.   Immunizations and Health Maintenance Immunization History  Administered Date(s) Administered  . Pneumococcal Conjugate-13 08/20/2015  . Pneumococcal Polysaccharide-23 09/25/2013  . Tdap 09/25/2013   Health Maintenance Due  Topic Date Due  . FOOT EXAM  12/09/1955  . OPHTHALMOLOGY EXAM  12/09/1955    Patient Care Team: Juline Patch, MD as PCP - General (Family Medicine)  Indicate any recent Medical Services you may have received from other than [facility] providers in the past year (date may be approximate).    Assessment:   This is a routine wellness examination for [place].  Hearing/Vision screen No exam data  present  Dietary issues and exercise activities discussed:    Goals    None     Depression Screen PHQ 2/9 Scores 06/08/2017 04/15/2016 11/20/2015 07/24/2015  PHQ - 2 Score 0 0 0 2  PHQ- 9 Score - - - 9    Fall Risk Fall Risk  06/08/2017 04/15/2016 11/20/2015 07/24/2015  Falls in the past year? No No No No    Is the patient's home free of loose throw rugs in walkways, pet beds, electrical cords, etc?   yes      Grab bars in the bathroom? yes      Handrails on the stairs?   yes      Adequate lighting?   yes  Cognitive Function:        Screening Tests Health Maintenance  Topic Date Due  . FOOT EXAM  12/09/1955  . OPHTHALMOLOGY EXAM  12/09/1955  . INFLUENZA VACCINE  11/05/2018 (Originally 06/16/2017)  . HEMOGLOBIN A1C  05/06/2018  . TETANUS/TDAP  09/26/2023  . COLONOSCOPY  09/05/2025  . Hepatitis C Screening  Completed  . PNA vac Low Risk Adult  Completed    Qualifies for Shingles Vaccine? Yes. Education has been provided regarding the importance of this vaccine. Pt has been advised to call his insurance company to determine his out of pocket expense. Advised he may also receive this vaccine at his local pharmacy or Health Dept. Verbalized acceptance and understanding. Cancer Screenings: Colorectal: Completed 09/06/15. Repeat colonoscopy every 10 years      Plan:   I have personally reviewed and addressed the Medicare Annual Wellness questionnaire and have noted the following in the patient's chart:  A. Medical and social history B. Use of alcohol, tobacco or illicit drugs  C. Current medications and supplements D. Functional ability and status E.  Nutritional status F.  Physical activity G. Advance directives H. List of other physicians I.  Hospitalizations, surgeries, and ER visits in previous 12 months J.  [place] such as hearing and vision if needed, cognitive and depression L. Referrals and appointments - none  In addition, I have reviewed and discussed  with patient certain preventive protocols, quality metrics, and best practice recommendations. A written personalized care plan for preventive services as well as general preventive health recommendations were provided to patient.  Signed,  Aleatha Borer, LPN Nurse Health Advisor  MD Recommendations: None

## 2017-12-01 ENCOUNTER — Other Ambulatory Visit: Payer: Self-pay

## 2017-12-01 DIAGNOSIS — H40053 Ocular hypertension, bilateral: Secondary | ICD-10-CM | POA: Diagnosis not present

## 2017-12-01 DIAGNOSIS — E11649 Type 2 diabetes mellitus with hypoglycemia without coma: Secondary | ICD-10-CM

## 2017-12-01 MED ORDER — DAPAGLIFLOZIN PROPANEDIOL 5 MG PO TABS: 5.0000 mg | ORAL_TABLET | Freq: Every day | ORAL | 1 refills | Status: DC

## 2017-12-02 ENCOUNTER — Other Ambulatory Visit: Payer: Self-pay

## 2017-12-02 MED ORDER — EMPAGLIFLOZIN 10 MG PO TABS: 10.0000 mg | ORAL_TABLET | Freq: Every day | ORAL | 1 refills | Status: DC

## 2017-12-06 ENCOUNTER — Other Ambulatory Visit: Payer: Self-pay

## 2017-12-06 DIAGNOSIS — E119 Type 2 diabetes mellitus without complications: Secondary | ICD-10-CM

## 2017-12-06 MED ORDER — METFORMIN HCL 500 MG PO TABS: 500.0000 mg | ORAL_TABLET | Freq: Two times a day (BID) | ORAL | 5 refills | Status: DC

## 2017-12-07 ENCOUNTER — Ambulatory Visit: Payer: Medicare Other | Admitting: Family Medicine

## 2017-12-07 ENCOUNTER — Encounter: Payer: Self-pay | Admitting: Family Medicine

## 2017-12-07 ENCOUNTER — Ambulatory Visit (INDEPENDENT_AMBULATORY_CARE_PROVIDER_SITE_OTHER): Payer: Medicare Other | Admitting: Family Medicine

## 2017-12-07 VITALS — BP 130/80 | HR 76 | Ht 67.0 in | Wt 221.0 lb

## 2017-12-07 DIAGNOSIS — E119 Type 2 diabetes mellitus without complications: Secondary | ICD-10-CM | POA: Diagnosis not present

## 2017-12-07 DIAGNOSIS — E782 Mixed hyperlipidemia: Secondary | ICD-10-CM | POA: Diagnosis not present

## 2017-12-07 DIAGNOSIS — N3941 Urge incontinence: Secondary | ICD-10-CM | POA: Diagnosis not present

## 2017-12-07 DIAGNOSIS — E039 Hypothyroidism, unspecified: Secondary | ICD-10-CM

## 2017-12-07 DIAGNOSIS — I10 Essential (primary) hypertension: Secondary | ICD-10-CM

## 2017-12-07 MED ORDER — METFORMIN HCL 500 MG PO TABS: 500.0000 mg | ORAL_TABLET | Freq: Two times a day (BID) | ORAL | 1 refills | Status: DC

## 2017-12-07 MED ORDER — LEVOTHYROXINE SODIUM 112 MCG PO TABS: 112.0000 ug | ORAL_TABLET | Freq: Every day | ORAL | 1 refills | Status: DC

## 2017-12-07 MED ORDER — OXYBUTYNIN CHLORIDE ER 10 MG PO TB24: 10.0000 mg | ORAL_TABLET | Freq: Every day | ORAL | 1 refills | Status: DC

## 2017-12-07 MED ORDER — BENAZEPRIL-HYDROCHLOROTHIAZIDE 10-12.5 MG PO TABS: 1.0000 | ORAL_TABLET | Freq: Every day | ORAL | 1 refills | Status: DC

## 2017-12-07 MED ORDER — PRAVASTATIN SODIUM 10 MG PO TABS: 10.0000 mg | ORAL_TABLET | Freq: Every day | ORAL | 1 refills | Status: DC

## 2017-12-07 MED ORDER — EMPAGLIFLOZIN 10 MG PO TABS: 10.0000 mg | ORAL_TABLET | Freq: Every day | ORAL | 5 refills | Status: DC

## 2017-12-07 NOTE — Progress Notes (Signed)
Name: Daniel Kirby   MRN: [id number]    DOB: 06-08-1946   Date:12/07/2017       Progress Note  Subjective  Chief Complaint  Chief Complaint  Patient presents with  . Diabetes    started Jardiance Friday/ needs more test strips. Range 120-140s  . Hyperlipidemia  . Hypertension  . Hypothyroidism    Diabetes  He presents for his follow-up diabetic visit. He has type 2 diabetes mellitus. His disease course has been stable. Pertinent negatives for hypoglycemia include no confusion, dizziness, headaches, hunger, mood changes, nervousness/anxiousness, pallor, seizures, sleepiness, speech difficulty, sweats or tremors. Pertinent negatives for diabetes include no blurred vision, no chest pain, no fatigue, no foot paresthesias, no foot ulcerations, no polydipsia, no polyphagia, no polyuria, no visual change, no weakness and no weight loss. There are no hypoglycemic complications. Symptoms are stable. There are no diabetic complications. Pertinent negatives for diabetic complications include no CVA, PVD or retinopathy. Current diabetic treatment includes oral agent (dual therapy). He is compliant with treatment all of the time. He is following a generally healthy diet. He participates in exercise intermittently. There is no change in his home blood glucose trend. His breakfast blood glucose is taken between 8-9 am. His breakfast blood glucose range is generally 130-140 mg/dl. An ACE inhibitor/angiotensin II receptor blocker is being taken. He does not see a podiatrist.Eye exam is current.  Hyperlipidemia  This is a chronic problem. The current episode started more than 1 year ago. The problem is controlled. Recent lipid tests were reviewed and are normal. He has no history of chronic renal disease, diabetes, hypothyroidism, liver disease, obesity or nephrotic syndrome. There are no known factors aggravating his hyperlipidemia. Pertinent negatives include no chest pain, focal weakness, myalgias or shortness of  breath. Current antihyperlipidemic treatment includes statins. The current treatment provides moderate improvement of lipids. There are no compliance problems.  Risk factors for coronary artery disease include dyslipidemia and diabetes mellitus.  Hypertension  This is a chronic problem. The current episode started more than 1 year ago. The problem is unchanged. The problem is controlled. Pertinent negatives include no anxiety, blurred vision, chest pain, headaches, malaise/fatigue, neck pain, orthopnea, palpitations, peripheral edema, PND, shortness of breath or sweats. There are no associated agents to hypertension. Risk factors for coronary artery disease include dyslipidemia and post-menopausal state. Past treatments include ACE inhibitors and diuretics. The current treatment provides moderate improvement. There are no compliance problems.  There is no history of angina, kidney disease, CAD/MI, CVA, heart failure, left ventricular hypertrophy, PVD or retinopathy. Identifiable causes of hypertension include a thyroid problem. There is no history of chronic renal disease, a hypertension causing med or renovascular disease.  Thyroid Problem  Presents for follow-up visit. Patient reports no anxiety, cold intolerance, constipation, depressed mood, diaphoresis, diarrhea, dry skin, fatigue, hair loss, heat intolerance, hoarse voice, leg swelling, menstrual problem, nail problem, palpitations, tremors, visual change, weight gain or weight loss. His past medical history is significant for hyperlipidemia. There is no history of diabetes or heart failure.  Urinary Frequency   The current episode started more than 1 year ago. The problem occurs intermittently. The problem has been waxing and waning. Associated symptoms include frequency. Pertinent negatives include no chills, hematuria, nausea, sweats or urgency.    No problem-specific Assessment & Plan notes found for this encounter.   Past Medical History:   Diagnosis Date  . Abdominal pain    RUQ  . Arthritis    right ankle  .  ED (erectile dysfunction)   . HLD (hyperlipidemia)   . Hypertension   . Hypothyroid   . Prostate cancer ([place])   . Vertigo    1 episode only, approx 1 month ago    Past Surgical History:  Procedure Laterality Date  . COLONOSCOPY  2006   normal  . COLONOSCOPY WITH PROPOFOL N/A 09/06/2015   Procedure: COLONOSCOPY WITH PROPOFOL;  Surgeon: Lucilla Lame, MD;  Location: [facility];  Service: Endoscopy;  Laterality: N/A;  . POLYPECTOMY  09/06/2015   Procedure: POLYPECTOMY;  Surgeon: Lucilla Lame, MD;  Location: [facility];  Service: Endoscopy;;  . robotic prostatectomy  2006    Family History  Problem Relation Age of Onset  . Heart attack Father   . Diabetes Mother   . Kidney disease Brother     Social History   Socioeconomic History  . Marital status: Legally Separated    Spouse name: Not on file  . Number of children: 3  . Years of education: Not on file  . Highest education level: 12th grade  Social Needs  . Financial resource strain: Not hard at all  . Food insecurity - worry: Never true  . Food insecurity - inability: Never true  . Transportation needs - medical: No  . Transportation needs - non-medical: No  Occupational History  . Occupation: Retired  Tobacco Use  . Smoking status: Former Smoker    Packs/day: 1.00    Years: 16.00    Pack years: 16.00    Types: Cigars  . Smokeless tobacco: Current User    Types: Snuff  . Tobacco comment: Quit 2007. Smoking cessationmaterials not required  Substance and Sexual Activity  . Alcohol use: Yes    Alcohol/week: 0.0 oz    Comment: occasional, 1-2 drinks per year  . Drug use: No  . Sexual activity: Yes  Other Topics Concern  . Not on file  Social History Narrative  . Not on file    No Known Allergies  Outpatient Medications Prior to Visit  Medication Sig Dispense Refill  . benazepril-hydrochlorthiazide (LOTENSIN HCT)  10-12.5 MG tablet Take 1 tablet by mouth daily. 90 tablet 1  . empagliflozin (JARDIANCE) 10 MG TABS tablet Take 10 mg by mouth daily. 30 tablet 1  . levothyroxine (SYNTHROID, LEVOTHROID) 112 MCG tablet Take 1 tablet (112 mcg total) by mouth daily. 90 tablet 3  . metFORMIN (GLUCOPHAGE) 500 MG tablet Take 1 tablet (500 mg total) by mouth 2 (two) times daily with a meal. 60 tablet 5  . oxybutynin (DITROPAN-XL) 10 MG 24 hr tablet Take 1 tablet (10 mg total) by mouth daily. 30 tablet 1  . pravastatin (PRAVACHOL) 10 MG tablet Take 1 tablet (10 mg total) by mouth daily. 90 tablet 1   No facility-administered medications prior to visit.     Review of Systems  Constitutional: Negative for chills, diaphoresis, fatigue, fever, malaise/fatigue, weight gain and weight loss.  HENT: Negative for ear discharge, ear pain, hoarse voice and sore throat.   Eyes: Negative for blurred vision.  Respiratory: Negative for cough, sputum production, shortness of breath and wheezing.   Cardiovascular: Negative for chest pain, palpitations, orthopnea, leg swelling and PND.  Gastrointestinal: Negative for abdominal pain, blood in stool, constipation, diarrhea, heartburn, melena and nausea.  Genitourinary: Positive for frequency. Negative for dysuria, hematuria, menstrual problem and urgency.  Musculoskeletal: Negative for back pain, joint pain, myalgias and neck pain.  Skin: Negative for pallor and rash.  Neurological: Negative for dizziness, tingling,  tremors, sensory change, focal weakness, seizures, speech difficulty, weakness and headaches.  Endo/Heme/Allergies: Negative for environmental allergies, cold intolerance, heat intolerance, polydipsia and polyphagia. Does not bruise/bleed easily.  Psychiatric/Behavioral: Negative for confusion, depression and suicidal ideas. The patient is not nervous/anxious and does not have insomnia.      Objective  Vitals:   12/07/17 1125  BP: 130/80  Pulse: 76  Weight: 221 lb  (100.2 kg)  Height: 5\' 7"  (1.702 m)    Physical Exam  Constitutional: He is oriented to person, place, and time and well-developed, well-nourished, and in no distress.  HENT:  Head: Normocephalic.  Right Ear: External ear normal.  Left Ear: External ear normal.  Nose: Nose normal.  Mouth/Throat: Oropharynx is clear and moist.  Eyes: Conjunctivae and EOM are normal. Pupils are equal, round, and reactive to light. Right eye exhibits no discharge. Left eye exhibits no discharge. No scleral icterus.  Neck: Normal range of motion. Neck supple. No JVD present. No tracheal deviation present. No thyromegaly present.  Cardiovascular: Normal rate, regular rhythm, normal heart sounds and intact distal pulses. Exam reveals no gallop and no friction rub.  No murmur heard. Pulmonary/Chest: Breath sounds normal. No respiratory distress. He has no wheezes. He has no rales.  Abdominal: Soft. Bowel sounds are normal. He exhibits no mass. There is no hepatosplenomegaly. There is no tenderness. There is no rebound, no guarding and no CVA tenderness.  Musculoskeletal: Normal range of motion. He exhibits no edema or tenderness.  Lymphadenopathy:    He has no cervical adenopathy.  Neurological: He is alert and oriented to person, place, and time. He has normal sensation, normal strength, normal reflexes and intact cranial nerves. No cranial nerve deficit.  Skin: Skin is warm. No rash noted.  Psychiatric: Mood and affect normal.  Nursing note and vitals reviewed.     Assessment & Plan  Problem List Items Addressed This Visit      Cardiovascular and Mediastinum   Essential hypertension   Relevant Medications   benazepril-hydrochlorthiazide (LOTENSIN HCT) 10-12.5 MG tablet   pravastatin (PRAVACHOL) 10 MG tablet     Endocrine   Adult hypothyroidism   Relevant Medications   levothyroxine (SYNTHROID, LEVOTHROID) 112 MCG tablet     Other   Mixed hyperlipidemia   Relevant Medications    benazepril-hydrochlorthiazide (LOTENSIN HCT) 10-12.5 MG tablet   pravastatin (PRAVACHOL) 10 MG tablet    Other Visit Diagnoses    Urge incontinence of urine    -  Primary   Relevant Medications   oxybutynin (DITROPAN-XL) 10 MG 24 hr tablet   Type 2 diabetes mellitus without complication, without long-term current use of insulin (HCC)       add farxiga 5 mg   Relevant Medications   benazepril-hydrochlorthiazide (LOTENSIN HCT) 10-12.5 MG tablet   pravastatin (PRAVACHOL) 10 MG tablet   empagliflozin (JARDIANCE) 10 MG TABS tablet   metFORMIN (GLUCOPHAGE) 500 MG tablet      Meds ordered this encounter  Medications  . levothyroxine (SYNTHROID, LEVOTHROID) 112 MCG tablet    Sig: Take 1 tablet (112 mcg total) by mouth daily.    Dispense:  90 tablet    Refill:  1    Hold until pt request  . benazepril-hydrochlorthiazide (LOTENSIN HCT) 10-12.5 MG tablet    Sig: Take 1 tablet by mouth daily.    Dispense:  90 tablet    Refill:  1    Hold until pt request  . pravastatin (PRAVACHOL) 10 MG tablet    Sig:  Take 1 tablet (10 mg total) by mouth daily.    Dispense:  90 tablet    Refill:  1    Hold until pt request  . oxybutynin (DITROPAN-XL) 10 MG 24 hr tablet    Sig: Take 1 tablet (10 mg total) by mouth daily.    Dispense:  90 tablet    Refill:  1  . empagliflozin (JARDIANCE) 10 MG TABS tablet    Sig: Take 10 mg by mouth daily.    Dispense:  30 tablet    Refill:  5    Hold until pt request  . metFORMIN (GLUCOPHAGE) 500 MG tablet    Sig: Take 1 tablet (500 mg total) by mouth 2 (two) times daily with a meal.    Dispense:  180 tablet    Refill:  1    Hold until pt request      Dr. [facility]  12/07/17

## 2018-01-06 ENCOUNTER — Encounter: Payer: Self-pay | Admitting: Urology

## 2018-01-06 ENCOUNTER — Telehealth: Payer: Self-pay | Admitting: Urology

## 2018-01-06 ENCOUNTER — Ambulatory Visit (INDEPENDENT_AMBULATORY_CARE_PROVIDER_SITE_OTHER): Payer: Medicare Other | Admitting: Urology

## 2018-01-06 VITALS — BP 127/84 | HR 120 | Ht 67.0 in | Wt 210.0 lb

## 2018-01-06 DIAGNOSIS — N3941 Urge incontinence: Secondary | ICD-10-CM

## 2018-01-06 DIAGNOSIS — N5231 Erectile dysfunction following radical prostatectomy: Secondary | ICD-10-CM | POA: Diagnosis not present

## 2018-01-06 DIAGNOSIS — R81 Glycosuria: Secondary | ICD-10-CM | POA: Diagnosis not present

## 2018-01-06 DIAGNOSIS — Z8546 Personal history of malignant neoplasm of prostate: Secondary | ICD-10-CM

## 2018-01-06 DIAGNOSIS — C61 Malignant neoplasm of prostate: Secondary | ICD-10-CM | POA: Diagnosis not present

## 2018-01-06 LAB — URINALYSIS, COMPLETE
Bilirubin, UA: NEGATIVE
KETONES UA: NEGATIVE
LEUKOCYTES UA: NEGATIVE
NITRITE UA: NEGATIVE
PROTEIN UA: NEGATIVE
RBC, UA: NEGATIVE
Specific Gravity, UA: 1.01 (ref 1.005–1.030)
Urobilinogen, Ur: 0.2 mg/dL (ref 0.2–1.0)
pH, UA: 5 (ref 5.0–7.5)

## 2018-01-06 LAB — BLADDER SCAN AMB NON-IMAGING

## 2018-01-06 MED ORDER — OXYBUTYNIN CHLORIDE ER 10 MG PO TB24: 10.0000 mg | ORAL_TABLET | Freq: Every day | ORAL | 3 refills | Status: DC

## 2018-01-06 MED ORDER — SILDENAFIL CITRATE 100 MG PO TABS: 100.0000 mg | ORAL_TABLET | Freq: Every day | ORAL | 12 refills | Status: DC | PRN

## 2018-01-06 NOTE — Telephone Encounter (Signed)
Would you refill his Trimix prescription to [facility]?  He would like you to tell them he will call to fill.

## 2018-01-06 NOTE — Telephone Encounter (Signed)
Medication sent to [facility].

## 2018-01-06 NOTE — Progress Notes (Signed)
01/06/2018 3:12 PM   Daniel Kirby [birth year] [id number]  Referring provider: Juline Patch, MD [address] [place], [place] [address]  Chief Complaint  Patient presents with  . Prostate Cancer    86month    HPI: 17 African-American male with a history of prostate cancer and urge incontinence who presents today for a 59-month follow-up after being placed on oxybutynin for his urge incontinence.  His I PSS score today is 11/2.  His PVR is 26 mL.  His previous I PSS score was 6/0.  His main complaints today are urinary frequency, nocturia and urge incontinence.  Patient denies any gross hematuria, dysuria or suprapubic/flank pain.  Patient denies any fevers, chills, nausea or vomiting.   His UA was positive for 3+ glucose.  He has been seen by his PCP for diabetes management.  Patient found some benefit with oxybutynin XL 10 mg daily.   IPSS    Row Name 01/06/18 1500         International Prostate Symptom Score   How often have you had the sensation of not emptying your bladder?  Almost always     How often have you had to urinate less than every two hours?  Less than half the time     How often have you found you stopped and started again several times when you urinated?  Not at All     How often have you found it difficult to postpone urination?  Less than half the time     How often have you had a weak urinary stream?  Not at All     How often have you had to strain to start urination?  Not at All     How many times did you typically get up at night to urinate?  2 Times     Total IPSS Score  11       Quality of Life due to urinary symptoms   If you were to spend the rest of your life with your urinary condition just the way it is now how would you feel about that?  Mostly Satisfied        Score:  1-7 Mild 8-19 Moderate 20-35 Severe  Erectile dysfunction Patient would like a refill on his tri-mix medication [facility].  Still working well  for him.  He denied any pain with erections or penile curvature.   PMH: Past Medical History:  Diagnosis Date  . Abdominal pain    RUQ  . Arthritis    right ankle  . ED (erectile dysfunction)   . HLD (hyperlipidemia)   . Hypertension   . Hypothyroid   . Prostate cancer ([place])   . Vertigo    1 episode only, approx 1 month ago    Surgical History: Past Surgical History:  Procedure Laterality Date  . COLONOSCOPY  2006   normal  . COLONOSCOPY WITH PROPOFOL N/A 09/06/2015   Procedure: COLONOSCOPY WITH PROPOFOL;  Surgeon: Lucilla Lame, MD;  Location: [facility];  Service: Endoscopy;  Laterality: N/A;  . POLYPECTOMY  09/06/2015   Procedure: POLYPECTOMY;  Surgeon: Lucilla Lame, MD;  Location: [facility];  Service: Endoscopy;;  . robotic prostatectomy  2006    Home Medications:  Allergies as of 01/06/2018   No Known Allergies     Medication List        Accurate as of 01/06/18  3:12 PM. Always use your most recent med list.  benazepril-hydrochlorthiazide 10-12.5 MG tablet Commonly known as:  LOTENSIN HCT Take 1 tablet by mouth daily.   empagliflozin 10 MG Tabs tablet Commonly known as:  JARDIANCE Take 10 mg by mouth daily.   levothyroxine 112 MCG tablet Commonly known as:  SYNTHROID, LEVOTHROID Take 1 tablet (112 mcg total) by mouth daily.   metFORMIN 500 MG tablet Commonly known as:  GLUCOPHAGE Take 1 tablet (500 mg total) by mouth 2 (two) times daily with a meal.   oxybutynin 10 MG 24 hr tablet Commonly known as:  DITROPAN-XL Take 1 tablet (10 mg total) by mouth daily.   pravastatin 10 MG tablet Commonly known as:  PRAVACHOL Take 1 tablet (10 mg total) by mouth daily.       Allergies: No Known Allergies  Family History: Family History  Problem Relation Age of Onset  . Heart attack Father   . Diabetes Mother   . Kidney disease Brother     Social History:  reports that he has quit smoking. His smoking use included cigars. He  has a 16.00 pack-year smoking history. His smokeless tobacco use includes snuff. He reports that he drinks alcohol. He reports that he does not use drugs.  ROS: UROLOGY Frequent Urination?: Yes Hard to postpone urination?: No Burning/pain with urination?: No Get up at night to urinate?: Yes Leakage of urine?: Yes Urine stream starts and stops?: No Trouble starting stream?: No Do you have to strain to urinate?: No Blood in urine?: No Urinary tract infection?: No Sexually transmitted disease?: No Injury to kidneys or bladder?: No Painful intercourse?: No Weak stream?: No Erection problems?: No Penile pain?: No  Gastrointestinal Nausea?: No Vomiting?: No Indigestion/heartburn?: No Diarrhea?: No Constipation?: No  Constitutional Fever: No Night sweats?: No Weight loss?: No Fatigue?: No  Skin Skin rash/lesions?: No Itching?: No  Eyes Blurred vision?: No Double vision?: No  Ears/Nose/Throat Sore throat?: No Sinus problems?: No  Hematologic/Lymphatic Swollen glands?: No Easy bruising?: No  Cardiovascular Leg swelling?: No Chest pain?: No  Respiratory Cough?: No Shortness of breath?: No  Endocrine Excessive thirst?: No  Musculoskeletal Back pain?: No Joint pain?: No  Neurological Headaches?: No Dizziness?: No  Psychologic Depression?: No Anxiety?: No  Physical Exam: BP 127/84   Pulse (!) 120   Ht 5\' 7"  (1.702 m)   Wt 210 lb (95.3 kg)   BMI 32.89 kg/m   Constitutional: Well nourished. Alert and oriented, No acute distress. HEENT: [place] AT, moist mucus membranes. Trachea midline, no masses. Cardiovascular: No clubbing, cyanosis, or edema. Respiratory: Normal respiratory effort, no increased work of breathing. Skin: No rashes, bruises or suspicious lesions. Lymph: No cervical or inguinal adenopathy. Neurologic: Grossly intact, no focal deficits, moving all 4 extremities. Psychiatric: Normal mood and affect.   Laboratory Data: No results  found for: WBC, HGB, HCT, MCV, PLT  Lab Results  Component Value Date   CREATININE 1.03 11/05/2017    Lab Results  Component Value Date   PSA1 <0.1 04/27/2017   PSA1 <0.1 04/29/2016   PSA1 <0.1 06/04/2015    No results found for: TESTOSTERONE  Lab Results  Component Value Date   HGBA1C 8.9 (H) 11/05/2017    Urinalysis Dipstick: 3+ glucose Microscopy: Negative I have reviewed the labs.  Assessment & Plan:    1. Urge incontinence  - continue the oxybutynin XL 10 mg daily; refills sent to pharmacy  - Urinalysis, Complete  - BLADDER SCAN AMB NON-IMAGING  - RTC in 04/2018 for I PSS and PVR  2. Glycosuria  As above. - Urinalysis, Complete - seen by PCP  3. Erectile dysfunction -Still good response with Trimix, refill [facility]  4. History of prostate cancer -Keep follow-up appointment in June 2019 for PSA, I PSS and exam    Return for keep follow up in 04/2018.  Zara Council, PA-C  [facility] [address], [place], [place] [address] [phone]

## 2018-01-18 ENCOUNTER — Ambulatory Visit: Payer: Medicare Other | Admitting: Family Medicine

## 2018-01-18 ENCOUNTER — Encounter: Payer: Self-pay | Admitting: Family Medicine

## 2018-01-18 ENCOUNTER — Ambulatory Visit (INDEPENDENT_AMBULATORY_CARE_PROVIDER_SITE_OTHER): Payer: Medicare Other | Admitting: Family Medicine

## 2018-01-18 DIAGNOSIS — E782 Mixed hyperlipidemia: Secondary | ICD-10-CM

## 2018-01-18 DIAGNOSIS — E039 Hypothyroidism, unspecified: Secondary | ICD-10-CM | POA: Diagnosis not present

## 2018-01-18 DIAGNOSIS — I10 Essential (primary) hypertension: Secondary | ICD-10-CM

## 2018-01-18 DIAGNOSIS — E119 Type 2 diabetes mellitus without complications: Secondary | ICD-10-CM | POA: Diagnosis not present

## 2018-01-18 DIAGNOSIS — E1169 Type 2 diabetes mellitus with other specified complication: Secondary | ICD-10-CM | POA: Insufficient documentation

## 2018-01-18 MED ORDER — EMPAGLIFLOZIN 10 MG PO TABS: 10.0000 mg | ORAL_TABLET | Freq: Every day | ORAL | 5 refills | Status: DC

## 2018-01-18 MED ORDER — LEVOTHYROXINE SODIUM 112 MCG PO TABS: 112.0000 ug | ORAL_TABLET | Freq: Every day | ORAL | 1 refills | Status: DC

## 2018-01-18 MED ORDER — BENAZEPRIL-HYDROCHLOROTHIAZIDE 10-12.5 MG PO TABS: 1.0000 | ORAL_TABLET | Freq: Every day | ORAL | 1 refills | Status: DC

## 2018-01-18 MED ORDER — PRAVASTATIN SODIUM 10 MG PO TABS: 10.0000 mg | ORAL_TABLET | Freq: Every day | ORAL | 1 refills | Status: DC

## 2018-01-18 MED ORDER — METFORMIN HCL 500 MG PO TABS: 500.0000 mg | ORAL_TABLET | Freq: Two times a day (BID) | ORAL | 1 refills | Status: DC

## 2018-01-18 NOTE — Progress Notes (Signed)
Name: Daniel Kirby   MRN: [id number]    DOB: 1946-01-08   Date:01/18/2018       Progress Note  Subjective  Chief Complaint  Chief Complaint  Patient presents with  . Follow-up    rechecking thyroid and A1C labs- pt had missed some pills and had not been eating well    Thyroid Problem  Presents for follow-up visit. Patient reports no anxiety, cold intolerance, constipation, depressed mood, diaphoresis, diarrhea, dry skin, fatigue, hair loss, heat intolerance, hoarse voice, leg swelling, menstrual problem, nail problem, palpitations, tremors, visual change, weight gain or weight loss. The symptoms have been stable.  Diabetes  He presents for his follow-up diabetic visit. He has type 2 diabetes mellitus. His disease course has been stable. There are no hypoglycemic associated symptoms. Pertinent negatives for hypoglycemia include no dizziness, headaches, nervousness/anxiousness or tremors. Pertinent negatives for diabetes include no blurred vision, no chest pain, no fatigue, no foot ulcerations, no polydipsia, no polyphagia, no polyuria, no visual change, no weakness and no weight loss. There are no hypoglycemic complications. Symptoms are stable. There are no diabetic complications.    No problem-specific Assessment & Plan notes found for this encounter.   Past Medical History:  Diagnosis Date  . Abdominal pain    RUQ  . Arthritis    right ankle  . ED (erectile dysfunction)   . HLD (hyperlipidemia)   . Hypertension   . Hypothyroid   . Prostate cancer ([place])   . Vertigo    1 episode only, approx 1 month ago    Past Surgical History:  Procedure Laterality Date  . COLONOSCOPY  2006   normal  . COLONOSCOPY WITH PROPOFOL N/A 09/06/2015   Procedure: COLONOSCOPY WITH PROPOFOL;  Surgeon: Lucilla Lame, MD;  Location: [place];  Service: Endoscopy;  Laterality: N/A;  . POLYPECTOMY  09/06/2015   Procedure: POLYPECTOMY;  Surgeon: Lucilla Lame, MD;  Location: [place];   Service: Endoscopy;;  . robotic prostatectomy  2006    Family History  Problem Relation Age of Onset  . Heart attack Father   . Diabetes Mother   . Kidney disease Brother     Social History   Socioeconomic History  . Marital status: Legally Separated    Spouse name: Not on file  . Number of children: 3  . Years of education: Not on file  . Highest education level: 12th grade  Social Needs  . Financial resource strain: Not hard at all  . Food insecurity - worry: Never true  . Food insecurity - inability: Never true  . Transportation needs - medical: No  . Transportation needs - non-medical: No  Occupational History  . Occupation: Retired  Tobacco Use  . Smoking status: Former Smoker    Packs/day: 1.00    Years: 16.00    Pack years: 16.00    Types: Cigars  . Smokeless tobacco: Current User    Types: Snuff  . Tobacco comment: Quit 2007. Smoking cessationmaterials not required  Substance and Sexual Activity  . Alcohol use: Yes    Alcohol/week: 0.0 oz    Comment: occasional, 1-2 drinks per year  . Drug use: No  . Sexual activity: Yes  Other Topics Concern  . Not on file  Social History Narrative  . Not on file    No Known Allergies  Outpatient Medications Prior to Visit  Medication Sig Dispense Refill  . oxybutynin (DITROPAN-XL) 10 MG 24 hr tablet Take 1 tablet (10 mg total) by  mouth daily. 90 tablet 3  . sildenafil (VIAGRA) 100 MG tablet Take 1 tablet (100 mg total) by mouth daily as needed for erectile dysfunction. Take two hours prior to intercourse on an empty stomach 6 tablet 12  . benazepril-hydrochlorthiazide (LOTENSIN HCT) 10-12.5 MG tablet Take 1 tablet by mouth daily. 90 tablet 1  . empagliflozin (JARDIANCE) 10 MG TABS tablet Take 10 mg by mouth daily. 30 tablet 5  . levothyroxine (SYNTHROID, LEVOTHROID) 112 MCG tablet Take 1 tablet (112 mcg total) by mouth daily. 90 tablet 1  . metFORMIN (GLUCOPHAGE) 500 MG tablet Take 1 tablet (500 mg total) by mouth 2  (two) times daily with a meal. 180 tablet 1  . pravastatin (PRAVACHOL) 10 MG tablet Take 1 tablet (10 mg total) by mouth daily. 90 tablet 1   No facility-administered medications prior to visit.     Review of Systems  Constitutional: Negative for chills, diaphoresis, fatigue, fever, malaise/fatigue, weight gain and weight loss.  HENT: Negative for ear discharge, ear pain, hoarse voice and sore throat.   Eyes: Negative for blurred vision.  Respiratory: Negative for cough, sputum production, shortness of breath and wheezing.   Cardiovascular: Negative for chest pain, palpitations and leg swelling.  Gastrointestinal: Negative for abdominal pain, blood in stool, constipation, diarrhea, heartburn, melena and nausea.  Genitourinary: Negative for dysuria, frequency, hematuria, menstrual problem and urgency.  Musculoskeletal: Negative for back pain, joint pain, myalgias and neck pain.  Skin: Negative for rash.  Neurological: Negative for dizziness, tingling, tremors, sensory change, focal weakness, weakness and headaches.  Endo/Heme/Allergies: Negative for environmental allergies, cold intolerance, heat intolerance, polydipsia and polyphagia. Does not bruise/bleed easily.  Psychiatric/Behavioral: Negative for depression and suicidal ideas. The patient is not nervous/anxious and does not have insomnia.      Objective  Vitals:   01/18/18 1045  BP: 138/90  Pulse: 80  Weight: 220 lb (99.8 kg)  Height: 5\' 7"  (1.702 m)    Physical Exam  Constitutional: He is oriented to person, place, and time and well-developed, well-nourished, and in no distress.  HENT:  Head: Normocephalic.  Right Ear: External ear normal.  Left Ear: External ear normal.  Nose: Nose normal.  Mouth/Throat: Oropharynx is clear and moist.  Eyes: Conjunctivae and EOM are normal. Pupils are equal, round, and reactive to light. Right eye exhibits no discharge. Left eye exhibits no discharge. No scleral icterus.  Neck: Normal  range of motion. Neck supple. No JVD present. No tracheal deviation present. No thyromegaly present.  Cardiovascular: Normal rate, regular rhythm, normal heart sounds and intact distal pulses. Exam reveals no gallop and no friction rub.  No murmur heard. Pulmonary/Chest: Breath sounds normal. No respiratory distress. He has no wheezes. He has no rales.  Abdominal: Soft. Bowel sounds are normal. He exhibits no mass. There is no hepatosplenomegaly. There is no tenderness. There is no rebound, no guarding and no CVA tenderness.  Musculoskeletal: Normal range of motion. He exhibits no edema or tenderness.  Lymphadenopathy:    He has no cervical adenopathy.  Neurological: He is alert and oriented to person, place, and time. He has normal sensation, normal strength, normal reflexes and intact cranial nerves. No cranial nerve deficit.  Skin: Skin is warm. No rash noted.  Psychiatric: Mood and affect normal.  Nursing note and vitals reviewed.     Assessment & Plan  Problem List Items Addressed This Visit      Cardiovascular and Mediastinum   Essential hypertension   Relevant Medications  pravastatin (PRAVACHOL) 10 MG tablet   benazepril-hydrochlorthiazide (LOTENSIN HCT) 10-12.5 MG tablet     Endocrine   Adult hypothyroidism   Relevant Medications   levothyroxine (SYNTHROID, LEVOTHROID) 112 MCG tablet   Other Relevant Orders   TSH   Type 2 diabetes mellitus without complication, without long-term current use of insulin (HCC)   Relevant Medications   pravastatin (PRAVACHOL) 10 MG tablet   empagliflozin (JARDIANCE) 10 MG TABS tablet   metFORMIN (GLUCOPHAGE) 500 MG tablet   benazepril-hydrochlorthiazide (LOTENSIN HCT) 10-12.5 MG tablet   Other Relevant Orders   Hemoglobin A1c     Other   Mixed hyperlipidemia   Relevant Medications   pravastatin (PRAVACHOL) 10 MG tablet   benazepril-hydrochlorthiazide (LOTENSIN HCT) 10-12.5 MG tablet      Meds ordered this encounter  Medications   . levothyroxine (SYNTHROID, LEVOTHROID) 112 MCG tablet    Sig: Take 1 tablet (112 mcg total) by mouth daily.    Dispense:  90 tablet    Refill:  1    Hold until pt request  . pravastatin (PRAVACHOL) 10 MG tablet    Sig: Take 1 tablet (10 mg total) by mouth daily.    Dispense:  90 tablet    Refill:  1    Hold until pt request  . empagliflozin (JARDIANCE) 10 MG TABS tablet    Sig: Take 10 mg by mouth daily.    Dispense:  30 tablet    Refill:  5    Hold until pt request  . metFORMIN (GLUCOPHAGE) 500 MG tablet    Sig: Take 1 tablet (500 mg total) by mouth 2 (two) times daily with a meal.    Dispense:  180 tablet    Refill:  1    Hold until pt request  . benazepril-hydrochlorthiazide (LOTENSIN HCT) 10-12.5 MG tablet    Sig: Take 1 tablet by mouth daily.    Dispense:  90 tablet    Refill:  1    Hold until pt request  gave samples x 3 weeks    Dr. Otilio Miu [facility]  01/18/18

## 2018-01-19 ENCOUNTER — Ambulatory Visit: Payer: Medicare Other | Admitting: Family Medicine

## 2018-01-19 LAB — HEMOGLOBIN A1C
ESTIMATED AVERAGE GLUCOSE: 120 mg/dL
Hgb A1c MFr Bld: 5.8 % — ABNORMAL HIGH (ref 4.8–5.6)

## 2018-01-19 LAB — TSH: TSH: 1.74 u[IU]/mL (ref 0.450–4.500)

## 2018-03-10 ENCOUNTER — Other Ambulatory Visit: Payer: Self-pay

## 2018-03-10 MED ORDER — GLIMEPIRIDE 2 MG PO TABS: 2.0000 mg | ORAL_TABLET | Freq: Every day | ORAL | 1 refills | Status: DC

## 2018-04-15 ENCOUNTER — Other Ambulatory Visit: Payer: Self-pay

## 2018-04-27 ENCOUNTER — Other Ambulatory Visit: Payer: Self-pay

## 2018-04-27 DIAGNOSIS — Z8546 Personal history of malignant neoplasm of prostate: Secondary | ICD-10-CM

## 2018-04-28 ENCOUNTER — Other Ambulatory Visit: Payer: Medicare Other

## 2018-04-28 DIAGNOSIS — Z8546 Personal history of malignant neoplasm of prostate: Secondary | ICD-10-CM

## 2018-04-29 LAB — PSA: Prostate Specific Ag, Serum: <0.1 ng/mL (ref 0.0–4.0)

## 2018-05-02 NOTE — Progress Notes (Signed)
05/03/2018 10:53 AM   Daniel Kirby Mar 16, 1946 [id number]  Referring provider: Juline Patch, MD [address] [place], [place] [address]  Chief Complaint  Patient presents with  . Urinary Incontinence    HPI: 33 African-American male with a history of prostate cancer and urge incontinence who presents today for a follow up.    History of prostate cancer Prostate cancer status post radical prostatectomy and adjuvant radiation therapy approximately 10 years ago.  PSA <0.1 ng/mL in 04/2018.    Urge incontinence His I PSS score today is 10/2.  His PVR is 20 mL.  His previous I PSS score was 11/2.  His previous PVR was 26 mL.  His main complaints today are urinary frequency, nocturia and urge incontinence.  Patient denies any gross hematuria, dysuria or suprapubic/flank pain.  Patient denies any fevers, chills, nausea or vomiting.   Patient found some benefit with oxybutynin XL 10 mg daily, but it has become cost prohibitive.   IPSS    Row Name 05/03/18 1000         International Prostate Symptom Score   How often have you had the sensation of not emptying your bladder?  Not at All     How often have you had to urinate less than every two hours?  Less than half the time     How often have you found you stopped and started again several times when you urinated?  Not at All     How often have you found it difficult to postpone urination?  Almost always     How often have you had a weak urinary stream?  Not at All     How often have you had to strain to start urination?  Not at All     How many times did you typically get up at night to urinate?  3 Times     Total IPSS Score  10       Quality of Life due to urinary symptoms   If you were to spend the rest of your life with your urinary condition just the way it is now how would you feel about that?  Mostly Satisfied        Score:  1-7 Mild 8-19 Moderate 20-35 Severe  Erectile dysfunction Patient would like a  refill on [facility]  Still working well for him.  He denied any pain with erections or penile curvature.   PMH: Past Medical History:  Diagnosis Date  . Abdominal pain    RUQ  . Arthritis    right ankle  . ED (erectile dysfunction)   . HLD (hyperlipidemia)   . Hypertension   . Hypothyroid   . Prostate cancer ([place])   . Vertigo    1 episode only, approx 1 month ago    Surgical History: Past Surgical History:  Procedure Laterality Date  . COLONOSCOPY  2006   normal  . COLONOSCOPY WITH PROPOFOL N/A 09/06/2015   Procedure: COLONOSCOPY WITH PROPOFOL;  Surgeon: Lucilla Lame, MD;  Location: [place];  Service: Endoscopy;  Laterality: N/A;  . POLYPECTOMY  09/06/2015   Procedure: POLYPECTOMY;  Surgeon: Lucilla Lame, MD;  Location: [place];  Service: Endoscopy;;  . robotic prostatectomy  2006    Home Medications:  Allergies as of 05/03/2018   No Known Allergies     Medication List        Accurate as of 05/03/18 10:53  AM. Always use your most recent med list.          benazepril-hydrochlorthiazide 10-12.5 MG tablet Commonly known as:  LOTENSIN HCT Take 1 tablet by mouth daily.   glimepiride 2 MG tablet Commonly known as:  AMARYL Take 1 tablet (2 mg total) by mouth daily with breakfast.   levothyroxine 112 MCG tablet Commonly known as:  SYNTHROID, LEVOTHROID Take 1 tablet (112 mcg total) by mouth daily.   metFORMIN 500 MG tablet Commonly known as:  GLUCOPHAGE Take 1 tablet (500 mg total) by mouth 2 (two) times daily with a meal.   oxybutynin 10 MG 24 hr tablet Commonly known as:  DITROPAN-XL Take 1 tablet (10 mg total) by mouth daily.   pravastatin 10 MG tablet Commonly known as:  PRAVACHOL Take 1 tablet (10 mg total) by mouth daily.   sildenafil 100 MG tablet Commonly known as:  VIAGRA Take 1 tablet (100 mg total) by mouth daily as needed for erectile dysfunction. Take two hours prior to  intercourse on an empty stomach       Allergies: No Known Allergies  Family History: Family History  Problem Relation Age of Onset  . Heart attack Father   . Diabetes Mother   . Kidney disease Brother     Social History:  reports that he has quit smoking. His smoking use included cigars. He has a 16.00 pack-year smoking history. His smokeless tobacco use includes snuff. He reports that he drinks alcohol. He reports that he does not use drugs.  ROS: UROLOGY Frequent Urination?: No Hard to postpone urination?: No Burning/pain with urination?: No Get up at night to urinate?: No Leakage of urine?: No Urine stream starts and stops?: No Trouble starting stream?: No Do you have to strain to urinate?: No Blood in urine?: No Urinary tract infection?: No Sexually transmitted disease?: No Injury to kidneys or bladder?: No Painful intercourse?: No Weak stream?: No Erection problems?: Yes Penile pain?: No  Gastrointestinal Nausea?: No Vomiting?: No Indigestion/heartburn?: No Diarrhea?: No Constipation?: No  Constitutional Fever: No Night sweats?: No Weight loss?: No Fatigue?: No  Skin Skin rash/lesions?: No Itching?: No  Eyes Blurred vision?: No Double vision?: No  Ears/Nose/Throat Sore throat?: No Sinus problems?: No  Hematologic/Lymphatic Swollen glands?: No Easy bruising?: No  Cardiovascular Leg swelling?: No Chest pain?: No  Respiratory Cough?: No Shortness of breath?: No  Endocrine Excessive thirst?: No  Musculoskeletal Back pain?: No Joint pain?: No  Neurological Headaches?: No Dizziness?: Yes  Psychologic Depression?: No Anxiety?: No  Physical Exam: BP (!) 149/78 (BP Location: Right Arm, Patient Position: Sitting, Cuff Size: Normal)   Pulse 70   Ht 5\' 7"  (1.702 m)   Wt 222 lb 3.2 oz (100.8 kg)   BMI 34.80 kg/m   Constitutional: Well nourished. Alert and oriented, No acute distress. HEENT: [place] AT, moist mucus membranes. Trachea  midline, no masses. Cardiovascular: No clubbing, cyanosis, or edema. Respiratory: Normal respiratory effort, no increased work of breathing. GI: Abdomen is soft, non tender, non distended, no abdominal masses. Liver and spleen not palpable.  No hernias appreciated.  Stool sample for occult testing is not indicated.   GU: No CVA tenderness.  No bladder fullness or masses.  Patient with circumcised phallus.  Urethral meatus is patent.  No penile discharge. No penile lesions or rashes. Scrotum without lesions, cysts, rashes and/or edema.  Testicles are located scrotally bilaterally. No masses are appreciated in the testicles. Left and right epididymis are normal. Rectal: Not performed.  Skin: No rashes, bruises or suspicious lesions. Lymph: No cervical or inguinal adenopathy. Neurologic: Grossly intact, no focal deficits, moving all 4 extremities. Psychiatric: Normal mood and affect.   Laboratory Data: No results found for: WBC, HGB, HCT, MCV, PLT  Lab Results  Component Value Date   CREATININE 1.03 11/05/2017    Lab Results  Component Value Date   PSA1 <0.1 04/28/2018   PSA1 <0.1 04/27/2017   PSA1 <0.1 04/29/2016    No results found for: TESTOSTERONE  Lab Results  Component Value Date   HGBA1C 5.8 (H) 01/18/2018    I have reviewed the labs.  Assessment & Plan:    1. Urge incontinence Prescribed oxybutynin 5 mg tid; prescription sent to pharmacy [facility] NON-IMAGING RTC in one year for I PSS and PVR  2. Erectile dysfunction Still good response with Trimix, [facility] - he does not have a sexual partner at this time  RTC in one year for exam  3. History of prostate cancer RTC in one year for PSA    Return in about 1 year (around 05/04/2019) for IPSS, PSA , PVR and exam.  Zara Council, [facility]  [place] [address], [place], [place] [address] [phone]

## 2018-05-03 ENCOUNTER — Encounter: Payer: Self-pay | Admitting: Urology

## 2018-05-03 ENCOUNTER — Telehealth: Payer: Self-pay | Admitting: Urology

## 2018-05-03 ENCOUNTER — Ambulatory Visit (INDEPENDENT_AMBULATORY_CARE_PROVIDER_SITE_OTHER): Payer: Medicare Other | Admitting: Urology

## 2018-05-03 VITALS — BP 149/78 | HR 70 | Ht 67.0 in | Wt 222.2 lb

## 2018-05-03 DIAGNOSIS — Z8546 Personal history of malignant neoplasm of prostate: Secondary | ICD-10-CM | POA: Diagnosis not present

## 2018-05-03 DIAGNOSIS — N3941 Urge incontinence: Secondary | ICD-10-CM

## 2018-05-03 DIAGNOSIS — N5231 Erectile dysfunction following radical prostatectomy: Secondary | ICD-10-CM | POA: Diagnosis not present

## 2018-05-03 LAB — BLADDER SCAN AMB NON-IMAGING: SCAN RESULT: 20

## 2018-05-03 MED ORDER — OXYBUTYNIN CHLORIDE 5 MG PO TABS: 5.0000 mg | ORAL_TABLET | Freq: Three times a day (TID) | ORAL | 11 refills | Status: DC

## 2018-05-03 NOTE — Telephone Encounter (Signed)
This needs to be sent to the clinical WQ not to me I can't call this in.   Sharyn Lull

## 2018-05-03 NOTE — Telephone Encounter (Signed)
Would you call in a refill for Trimix for Daniel Kirby to [place]?

## 2018-05-04 NOTE — Telephone Encounter (Signed)
Would you call in Trimix for Daniel Kirby to [place]?

## 2018-05-05 NOTE — Telephone Encounter (Signed)
Done

## 2018-05-13 ENCOUNTER — Other Ambulatory Visit: Payer: Self-pay

## 2018-05-13 MED ORDER — GLIMEPIRIDE 2 MG PO TABS: 2.0000 mg | ORAL_TABLET | Freq: Every day | ORAL | 1 refills | Status: DC

## 2018-05-23 ENCOUNTER — Other Ambulatory Visit: Payer: Self-pay

## 2018-05-31 DIAGNOSIS — H40053 Ocular hypertension, bilateral: Secondary | ICD-10-CM | POA: Diagnosis not present

## 2018-06-01 LAB — HM DIABETES EYE EXAM

## 2018-06-03 ENCOUNTER — Other Ambulatory Visit: Payer: Self-pay

## 2018-06-12 ENCOUNTER — Other Ambulatory Visit: Payer: Self-pay | Admitting: Family Medicine

## 2018-06-12 DIAGNOSIS — M1990 Unspecified osteoarthritis, unspecified site: Secondary | ICD-10-CM

## 2018-07-07 ENCOUNTER — Ambulatory Visit (INDEPENDENT_AMBULATORY_CARE_PROVIDER_SITE_OTHER): Payer: Medicare Other | Admitting: Family Medicine

## 2018-07-07 ENCOUNTER — Encounter: Payer: Self-pay | Admitting: Family Medicine

## 2018-07-07 VITALS — BP 120/70 | HR 76 | Ht 67.0 in | Wt 225.0 lb

## 2018-07-07 DIAGNOSIS — N3941 Urge incontinence: Secondary | ICD-10-CM

## 2018-07-07 DIAGNOSIS — E119 Type 2 diabetes mellitus without complications: Secondary | ICD-10-CM

## 2018-07-07 DIAGNOSIS — I10 Essential (primary) hypertension: Secondary | ICD-10-CM | POA: Diagnosis not present

## 2018-07-07 DIAGNOSIS — E039 Hypothyroidism, unspecified: Secondary | ICD-10-CM

## 2018-07-07 DIAGNOSIS — R69 Illness, unspecified: Secondary | ICD-10-CM | POA: Diagnosis not present

## 2018-07-07 DIAGNOSIS — E782 Mixed hyperlipidemia: Secondary | ICD-10-CM

## 2018-07-07 MED ORDER — PRAVASTATIN SODIUM 10 MG PO TABS: 10.0000 mg | ORAL_TABLET | Freq: Every day | ORAL | 1 refills | Status: DC

## 2018-07-07 MED ORDER — LEVOTHYROXINE SODIUM 112 MCG PO TABS: 112.0000 ug | ORAL_TABLET | Freq: Every day | ORAL | 1 refills | Status: DC

## 2018-07-07 MED ORDER — BENAZEPRIL-HYDROCHLOROTHIAZIDE 10-12.5 MG PO TABS: 1.0000 | ORAL_TABLET | Freq: Every day | ORAL | 1 refills | Status: DC

## 2018-07-07 MED ORDER — OXYBUTYNIN CHLORIDE 5 MG PO TABS: 5.0000 mg | ORAL_TABLET | Freq: Three times a day (TID) | ORAL | 1 refills | Status: DC

## 2018-07-07 MED ORDER — GLIMEPIRIDE 2 MG PO TABS: 2.0000 mg | ORAL_TABLET | Freq: Every day | ORAL | 1 refills | Status: DC

## 2018-07-07 MED ORDER — METFORMIN HCL 500 MG PO TABS: 500.0000 mg | ORAL_TABLET | Freq: Two times a day (BID) | ORAL | 1 refills | Status: DC

## 2018-07-07 NOTE — Assessment & Plan Note (Addendum)
Chronic Controlled. Continue Glimeperide 2 mg daily. Will check A1C.

## 2018-07-07 NOTE — Assessment & Plan Note (Signed)
Chronic Stable Continue benazepril-hctz 10-12.5 mg. Will check renal panel.

## 2018-07-07 NOTE — Assessment & Plan Note (Signed)
Chronic Controlled Continue pravastatin 10 mg daily. Check lipid panel.

## 2018-07-07 NOTE — Progress Notes (Signed)
Name: Daniel Kirby   MRN: [id number]    DOB: June 08, 1946   Date:07/07/2018       Progress Note  Subjective  Chief Complaint  Chief Complaint  Patient presents with  . Hypertension  . Hyperlipidemia  . Hypothyroidism  . Diabetes  . urge incontinence    Hypertension  This is a chronic problem. The current episode started more than 1 year ago. The problem is unchanged. The problem is controlled. Pertinent negatives include no anxiety, blurred vision, chest pain, headaches, malaise/fatigue, neck pain, orthopnea, palpitations, peripheral edema, PND, shortness of breath or sweats. Risk factors for coronary artery disease include dyslipidemia and diabetes mellitus. Past treatments include ACE inhibitors and diuretics. The current treatment provides moderate improvement. There are no compliance problems.  There is no history of angina, kidney disease, CAD/MI, CVA, heart failure, left ventricular hypertrophy, PVD or retinopathy. Identifiable causes of hypertension include a thyroid problem. There is no history of chronic renal disease, a hypertension causing med or renovascular disease.  Hyperlipidemia  This is a chronic problem. The current episode started more than 1 year ago. The problem is controlled. Recent lipid tests were reviewed and are normal. He has no history of chronic renal disease, diabetes, hypothyroidism, liver disease, obesity or nephrotic syndrome. Factors aggravating his hyperlipidemia include thiazides. Pertinent negatives include no chest pain, focal sensory loss, focal weakness, leg pain, myalgias or shortness of breath. Current antihyperlipidemic treatment includes statins. The current treatment provides moderate improvement of lipids. There are no compliance problems.  Risk factors for coronary artery disease include diabetes mellitus, dyslipidemia, hypertension and male sex.  Diabetes  He presents for his follow-up diabetic visit. He has type 2 diabetes mellitus. His disease  course has been stable. Pertinent negatives for hypoglycemia include no confusion, dizziness, headaches, hunger, mood changes, nervousness/anxiousness, pallor, seizures, sleepiness, speech difficulty, sweats or tremors. Pertinent negatives for diabetes include no blurred vision, no chest pain, no fatigue, no foot paresthesias, no foot ulcerations, no polydipsia, no polyphagia, no polyuria, no visual change and no weight loss. Symptoms are stable. There are no diabetic complications. Pertinent negatives for diabetic complications include no autonomic neuropathy, CVA, heart disease, nephropathy, peripheral neuropathy, PVD or retinopathy. Risk factors for coronary artery disease include diabetes mellitus, dyslipidemia, hypertension and male sex. Current diabetic treatment includes oral agent (dual therapy). He is compliant with treatment all of the time. His weight is stable. He is following a generally healthy diet. He participates in exercise daily. His home blood glucose trend is fluctuating minimally. An ACE inhibitor/angiotensin II receptor blocker is being taken. Eye exam is not current.  Thyroid Problem  Presents for follow-up visit. Patient reports no anxiety, cold intolerance, constipation, depressed mood, diaphoresis, diarrhea, dry skin, fatigue, hair loss, heat intolerance, hoarse voice, leg swelling, nail problem, palpitations, tremors, visual change, weight gain or weight loss. The symptoms have been stable. His past medical history is significant for hyperlipidemia. There is no history of diabetes or heart failure.    Type 2 diabetes mellitus without complication, without long-term current use of insulin (HCC) Chronic Controlled. Continue Glimeperide 2 mg daily. Will check A1C.   Mixed hyperlipidemia Chronic Controlled Continue pravastatin 10 mg daily. Check lipid panel.   Adult hypothyroidism Chronic Stable Will check TSH. Will continue levothyroxine if normal dose pending TSH.  Essential  hypertension Chronic Stable Continue benazepril-hctz 10-12.5 mg. Will check renal panel.   Past Medical History:  Diagnosis Date  . Abdominal pain    RUQ  . Arthritis  right ankle  . ED (erectile dysfunction)   . HLD (hyperlipidemia)   . Hypertension   . Hypothyroid   . Prostate cancer ([place])   . Vertigo    1 episode only, approx 1 month ago    Past Surgical History:  Procedure Laterality Date  . COLONOSCOPY  2006   normal  . COLONOSCOPY WITH PROPOFOL N/A 09/06/2015   Procedure: COLONOSCOPY WITH PROPOFOL;  Surgeon: Lucilla Lame, MD;  Location: [place];  Service: Endoscopy;  Laterality: N/A;  . POLYPECTOMY  09/06/2015   Procedure: POLYPECTOMY;  Surgeon: Lucilla Lame, MD;  Location: [place];  Service: Endoscopy;;  . robotic prostatectomy  2006    Family History  Problem Relation Age of Onset  . Heart attack Father   . Diabetes Mother   . Kidney disease Brother     Social History   Socioeconomic History  . Marital status: Legally Separated    Spouse name: Not on file  . Number of children: 3  . Years of education: Not on file  . Highest education level: 12th grade  Occupational History  . Occupation: Retired  Scientific laboratory technician  . Financial resource strain: Not hard at all  . Food insecurity:    Worry: Never true    Inability: Never true  . Transportation needs:    Medical: No    Non-medical: No  Tobacco Use  . Smoking status: Former Smoker    Packs/day: 1.00    Years: 16.00    Pack years: 16.00    Types: Cigars  . Smokeless tobacco: Current User    Types: Snuff  . Tobacco comment: Quit 2007. Smoking cessationmaterials not required  Substance and Sexual Activity  . Alcohol use: Yes    Alcohol/week: 0.0 standard drinks    Comment: occasional, 1-2 drinks per year  . Drug use: No  . Sexual activity: Yes  Lifestyle  . Physical activity:    Days per week: 0 days    Minutes per session: 0 min  . Stress: Not at all  Relationships  .  Social connections:    Talks on phone: More than three times a week    Gets together: Once a week    Attends religious service: Never    Active member of club or organization: Yes    Attends meetings of clubs or organizations: More than 4 times per year    Relationship status: Separated  . Intimate partner violence:    Fear of current or ex partner: No    Emotionally abused: No    Physically abused: No    Forced sexual activity: No  Other Topics Concern  . Not on file  Social History Narrative  . Not on file    No Known Allergies  Outpatient Medications Prior to Visit  Medication Sig Dispense Refill  . meloxicam (MOBIC) 7.5 MG tablet TAKE 1 TABLET BY MOUTH ONCE DAILY 90 tablet 0  . sildenafil (VIAGRA) 100 MG tablet Take 1 tablet (100 mg total) by mouth daily as needed for erectile dysfunction. Take two hours prior to intercourse on an empty stomach 6 tablet 12  . benazepril-hydrochlorthiazide (LOTENSIN HCT) 10-12.5 MG tablet Take 1 tablet by mouth daily. 90 tablet 1  . glimepiride (AMARYL) 2 MG tablet Take 1 tablet (2 mg total) by mouth daily with breakfast. 30 tablet 1  . levothyroxine (SYNTHROID, LEVOTHROID) 112 MCG tablet Take 1 tablet (112 mcg total) by mouth daily. 90 tablet 1  . metFORMIN (GLUCOPHAGE) 500 MG  tablet Take 1 tablet (500 mg total) by mouth 2 (two) times daily with a meal. 180 tablet 1  . oxybutynin (DITROPAN) 5 MG tablet Take 1 tablet (5 mg total) by mouth 3 (three) times daily. 90 tablet 11  . pravastatin (PRAVACHOL) 10 MG tablet Take 1 tablet (10 mg total) by mouth daily. 90 tablet 1   No facility-administered medications prior to visit.     Review of Systems  Constitutional: Negative for chills, diaphoresis, fatigue, fever, malaise/fatigue, weight gain and weight loss.  HENT: Negative for ear discharge, ear pain, hoarse voice and sore throat.   Eyes: Negative for blurred vision.  Respiratory: Negative for cough, sputum production, shortness of breath and  wheezing.   Cardiovascular: Negative for chest pain, palpitations, orthopnea, leg swelling and PND.  Gastrointestinal: Negative for abdominal pain, blood in stool, constipation, diarrhea, heartburn, melena and nausea.  Genitourinary: Negative for dysuria, frequency, hematuria and urgency.  Musculoskeletal: Negative for back pain, joint pain, myalgias and neck pain.  Skin: Negative for pallor and rash.  Neurological: Negative for dizziness, tingling, tremors, sensory change, focal weakness, seizures, speech difficulty and headaches.  Endo/Heme/Allergies: Negative for environmental allergies, cold intolerance, heat intolerance, polydipsia and polyphagia. Does not bruise/bleed easily.  Psychiatric/Behavioral: Negative for confusion, depression and suicidal ideas. The patient is not nervous/anxious and does not have insomnia.      Objective  Vitals:   07/07/18 1021  BP: 120/70  Pulse: 76  Weight: 225 lb (102.1 kg)  Height: 5\' 7"  (1.702 m)    Physical Exam  Constitutional: He is oriented to person, place, and time. He appears well-developed and well-nourished.  HENT:  Head: Normocephalic.  Right Ear: External ear normal.  Left Ear: External ear normal.  Nose: Nose normal.  Mouth/Throat: Oropharynx is clear and moist.  Eyes: Pupils are equal, round, and reactive to light. Conjunctivae and EOM are normal. Right eye exhibits no discharge. Left eye exhibits no discharge. No scleral icterus.  Neck: Normal range of motion. Neck supple. No JVD present. No tracheal deviation present. No thyromegaly present.  Cardiovascular: Normal rate, regular rhythm, normal heart sounds and intact distal pulses. Exam reveals no gallop and no friction rub.  No murmur heard. Pulmonary/Chest: Breath sounds normal. No respiratory distress. He has no wheezes. He has no rales.  Abdominal: Soft. Bowel sounds are normal. He exhibits no mass. There is no hepatosplenomegaly. There is no tenderness. There is no rebound,  no guarding and no CVA tenderness.  Musculoskeletal: Normal range of motion. He exhibits no edema or tenderness.  Feet:  Right Foot:  Protective Sensation: 10 sites tested. 10 sites sensed.  Skin Integrity: Negative for ulcer, blister, skin breakdown, erythema, warmth, callus or dry skin.  Left Foot:  Protective Sensation: 10 sites tested. 10 sites sensed.  Skin Integrity: Negative for ulcer, blister, skin breakdown, erythema, warmth, callus or dry skin.  Lymphadenopathy:    He has no cervical adenopathy.  Neurological: He is alert and oriented to person, place, and time. He has normal strength and normal reflexes. No cranial nerve deficit.  Skin: Skin is warm. No rash noted.      Assessment & Plan  Problem List Items Addressed This Visit      Cardiovascular and Mediastinum   Essential hypertension    Chronic Stable Continue benazepril-hctz 10-12.5 mg. Will check renal panel.      Relevant Medications   benazepril-hydrochlorthiazide (LOTENSIN HCT) 10-12.5 MG tablet   pravastatin (PRAVACHOL) 10 MG tablet     Endocrine  Adult hypothyroidism    Chronic Stable Will check TSH. Will continue levothyroxine if normal dose pending TSH.      Relevant Medications   levothyroxine (SYNTHROID, LEVOTHROID) 112 MCG tablet   Other Relevant Orders   TSH   Type 2 diabetes mellitus without complication, without long-term current use of insulin ([place]) - Primary    Chronic Controlled. Continue Glimeperide 2 mg daily. Will check A1C.       Relevant Medications   benazepril-hydrochlorthiazide (LOTENSIN HCT) 10-12.5 MG tablet   glimepiride (AMARYL) 2 MG tablet   metFORMIN (GLUCOPHAGE) 500 MG tablet   pravastatin (PRAVACHOL) 10 MG tablet   Other Relevant Orders   HgB A1c     Other   Mixed hyperlipidemia    Chronic Controlled Continue pravastatin 10 mg daily. Check lipid panel.       Relevant Medications   benazepril-hydrochlorthiazide (LOTENSIN HCT) 10-12.5 MG tablet   pravastatin  (PRAVACHOL) 10 MG tablet   Other Relevant Orders   Lipid Panel With LDL/HDL Ratio    Other Visit Diagnoses    Urge incontinence of urine       Chronic stable. Continue oxybutynin 5 mg daily.    Relevant Medications   oxybutynin (DITROPAN) 5 MG tablet   Taking medication for chronic disease       To eval for chronic statin use.   Relevant Orders   Hepatic function panel      Meds ordered this encounter  Medications  . benazepril-hydrochlorthiazide (LOTENSIN HCT) 10-12.5 MG tablet    Sig: Take 1 tablet by mouth daily.    Dispense:  90 tablet    Refill:  1  . glimepiride (AMARYL) 2 MG tablet    Sig: Take 1 tablet (2 mg total) by mouth daily with breakfast.    Dispense:  90 tablet    Refill:  1  . levothyroxine (SYNTHROID, LEVOTHROID) 112 MCG tablet    Sig: Take 1 tablet (112 mcg total) by mouth daily.    Dispense:  90 tablet    Refill:  1  . metFORMIN (GLUCOPHAGE) 500 MG tablet    Sig: Take 1 tablet (500 mg total) by mouth 2 (two) times daily with a meal.    Dispense:  180 tablet    Refill:  1  . oxybutynin (DITROPAN) 5 MG tablet    Sig: Take 1 tablet (5 mg total) by mouth 3 (three) times daily.    Dispense:  90 tablet    Refill:  1  . pravastatin (PRAVACHOL) 10 MG tablet    Sig: Take 1 tablet (10 mg total) by mouth daily.    Dispense:  90 tablet    Refill:  1    Hold until pt request      Dr. Otilio Miu [facility]  07/07/18

## 2018-07-07 NOTE — Assessment & Plan Note (Signed)
Chronic Stable Will check TSH. Will continue levothyroxine if normal dose pending TSH.

## 2018-07-08 LAB — HEPATIC FUNCTION PANEL
ALT: 17 IU/L (ref 0–44)
AST: 8 IU/L (ref 0–40)
Albumin: 4.5 g/dL (ref 3.5–4.8)
Alkaline Phosphatase: 66 IU/L (ref 39–117)
BILIRUBIN TOTAL: 0.4 mg/dL (ref 0.0–1.2)
Bilirubin, Direct: 0.12 mg/dL (ref 0.00–0.40)
Total Protein: 7.3 g/dL (ref 6.0–8.5)

## 2018-07-08 LAB — TSH: TSH: 0.953 u[IU]/mL (ref 0.450–4.500)

## 2018-07-08 LAB — LIPID PANEL WITH LDL/HDL RATIO
CHOLESTEROL TOTAL: 137 mg/dL (ref 100–199)
HDL: 33 mg/dL — AB (ref >39)
LDL CALC: 75 mg/dL (ref 0–99)
LDl/HDL Ratio: 2.3 ratio (ref 0.0–3.6)
TRIGLYCERIDES: 144 mg/dL (ref 0–149)
VLDL Cholesterol Cal: 29 mg/dL (ref 5–40)

## 2018-07-08 LAB — HEMOGLOBIN A1C
Est. average glucose Bld gHb Est-mCnc: 117 mg/dL
Hgb A1c MFr Bld: 5.7 % — ABNORMAL HIGH (ref 4.8–5.6)

## 2018-09-22 ENCOUNTER — Other Ambulatory Visit: Payer: Self-pay | Admitting: Family Medicine

## 2018-09-22 DIAGNOSIS — M1990 Unspecified osteoarthritis, unspecified site: Secondary | ICD-10-CM

## 2018-09-23 ENCOUNTER — Ambulatory Visit (INDEPENDENT_AMBULATORY_CARE_PROVIDER_SITE_OTHER): Payer: Medicare Other | Admitting: Family Medicine

## 2018-09-23 ENCOUNTER — Encounter: Payer: Self-pay | Admitting: Family Medicine

## 2018-09-23 VITALS — BP 130/80 | HR 68 | Ht 67.0 in | Wt 229.0 lb

## 2018-09-23 DIAGNOSIS — J0100 Acute maxillary sinusitis, unspecified: Secondary | ICD-10-CM

## 2018-09-23 MED ORDER — AMOXICILLIN 500 MG PO CAPS: 500.0000 mg | ORAL_CAPSULE | Freq: Three times a day (TID) | ORAL | 0 refills | Status: DC

## 2018-09-23 NOTE — Progress Notes (Signed)
Date:  09/23/2018   Name:  Daniel Kirby   DOB:  08-17-1946   MRN:  [id number]   Chief Complaint: Sinusitis (congestion, nasal stuffiness, dry cough) Sinusitis  This is a new problem. The current episode started yesterday. The problem has been gradually worsening since onset. There has been no fever. The pain is moderate. Associated symptoms include congestion, coughing, a hoarse voice, sinus pressure and sneezing. Pertinent negatives include no chills, diaphoresis, ear pain, headaches, neck pain, shortness of breath, sore throat or swollen glands. Past treatments include oral decongestants. The treatment provided mild relief.     Review of Systems  Constitutional: Negative for appetite change, chills, diaphoresis, fatigue, fever and unexpected weight change.  HENT: Positive for congestion, hoarse voice, sinus pressure and sneezing. Negative for ear pain, facial swelling, hearing loss, nosebleeds, sore throat and trouble swallowing.   Eyes: Negative for photophobia, pain, discharge, redness, itching and visual disturbance.  Respiratory: Positive for cough. Negative for choking, chest tightness, shortness of breath and wheezing.   Cardiovascular: Negative for chest pain, palpitations and leg swelling.  Gastrointestinal: Negative for abdominal pain, blood in stool, constipation, diarrhea, nausea, rectal pain and vomiting.  Endocrine: Negative for cold intolerance, heat intolerance, polydipsia, polyphagia and polyuria.  Genitourinary: Negative for decreased urine volume, difficulty urinating, discharge, dysuria, flank pain, frequency, hematuria, penile pain, penile swelling, scrotal swelling, testicular pain and urgency.  Musculoskeletal: Negative for back pain, joint swelling, neck pain and neck stiffness.  Skin: Negative for color change and rash.  Allergic/Immunologic: Negative for immunocompromised state.  Neurological: Negative for dizziness, tremors, seizures, syncope, speech difficulty,  weakness, light-headedness, numbness and headaches.  Hematological: Does not bruise/bleed easily.  Psychiatric/Behavioral: Negative for agitation, behavioral problems, confusion, dysphoric mood, hallucinations, self-injury and suicidal ideas. The patient is not nervous/anxious.     Patient Active Problem List   Diagnosis Date Noted  . Type 2 diabetes mellitus without complication, without long-term current use of insulin ([place]) 01/18/2018  . Chronic low back pain without sciatica 06/08/2017  . Essential hypertension 12/08/2016  . Adult hypothyroidism 12/08/2016  . Arthritis 12/08/2016  . Acute anxiety 12/08/2016  . Cervical radiculopathy 12/08/2016  . Mixed hyperlipidemia 12/08/2016  . Special screening for malignant neoplasms, colon   . Benign neoplasm of ascending colon   . Major depressive disorder with single episode, in partial remission ([place]) 08/20/2015  . Erectile dysfunction 06/04/2015  . History of prostate cancer 06/04/2015    No Known Allergies  Past Surgical History:  Procedure Laterality Date  . COLONOSCOPY  2006   normal  . COLONOSCOPY WITH PROPOFOL N/A 09/06/2015   Procedure: COLONOSCOPY WITH PROPOFOL;  Surgeon: Lucilla Lame, MD;  Location: [place];  Service: Endoscopy;  Laterality: N/A;  . POLYPECTOMY  09/06/2015   Procedure: POLYPECTOMY;  Surgeon: Lucilla Lame, MD;  Location: [place];  Service: Endoscopy;;  . robotic prostatectomy  2006    Social History   Tobacco Use  . Smoking status: Former Smoker    Packs/day: 1.00    Years: 16.00    Pack years: 16.00    Types: Cigars  . Smokeless tobacco: Current User    Types: Snuff  . Tobacco comment: Quit 2007. Smoking cessationmaterials not required  Substance Use Topics  . Alcohol use: Yes    Alcohol/week: 0.0 standard drinks    Comment: occasional, 1-2 drinks per year  . Drug use: No     Medication list has been reviewed and updated.  Current Meds  Medication  Sig  .  benazepril-hydrochlorthiazide (LOTENSIN HCT) 10-12.5 MG tablet Take 1 tablet by mouth daily.  Marland Kitchen glimepiride (AMARYL) 2 MG tablet Take 1 tablet (2 mg total) by mouth daily with breakfast.  . levothyroxine (SYNTHROID, LEVOTHROID) 112 MCG tablet Take 1 tablet (112 mcg total) by mouth daily.  . meloxicam (MOBIC) 7.5 MG tablet TAKE 1 TABLET BY MOUTH ONCE DAILY  . metFORMIN (GLUCOPHAGE) 500 MG tablet Take 1 tablet (500 mg total) by mouth 2 (two) times daily with a meal.  . oxybutynin (DITROPAN) 5 MG tablet Take 1 tablet (5 mg total) by mouth 3 (three) times daily.  . pravastatin (PRAVACHOL) 10 MG tablet Take 1 tablet (10 mg total) by mouth daily.  . sildenafil (VIAGRA) 100 MG tablet Take 1 tablet (100 mg total) by mouth daily as needed for erectile dysfunction. Take two hours prior to intercourse on an empty stomach    PHQ 2/9 Scores 09/23/2018 11/10/2017 06/08/2017 04/15/2016  PHQ - 2 Score 0 1 0 0  PHQ- 9 Score 0 - - -    Physical Exam  Constitutional: He is oriented to person, place, and time.  HENT:  Head: Normocephalic.  Right Ear: External ear normal.  Left Ear: External ear normal.  Nose: Nose normal.  Mouth/Throat: Oropharynx is clear and moist.  Eyes: Pupils are equal, round, and reactive to light. Conjunctivae and EOM are normal. Right eye exhibits no discharge. Left eye exhibits no discharge. No scleral icterus.  Neck: Normal range of motion. Neck supple. No JVD present. No tracheal deviation present. No thyromegaly present.  Cardiovascular: Normal rate, regular rhythm, normal heart sounds and intact distal pulses. Exam reveals no gallop and no friction rub.  No murmur heard. Pulmonary/Chest: Breath sounds normal. No respiratory distress. He has no wheezes. He has no rales.  Abdominal: Soft. Bowel sounds are normal. He exhibits no mass. There is no hepatosplenomegaly. There is no tenderness. There is no rebound, no guarding and no CVA tenderness.  Musculoskeletal: Normal range of  motion. He exhibits no edema or tenderness.  Lymphadenopathy:    He has no cervical adenopathy.  Neurological: He is alert and oriented to person, place, and time. He has normal strength and normal reflexes. No cranial nerve deficit.  Skin: Skin is warm. No rash noted.  Nursing note and vitals reviewed.   BP 130/80   Pulse 68   Ht 5\' 7"  (1.702 m)   Wt 229 lb (103.9 kg)   BMI 35.87 kg/m   Assessment and Plan:  1. Acute maxillary sinusitis, recurrence not specified Start Amoxicillin - amoxicillin (AMOXIL) 500 MG capsule; Take 1 capsule (500 mg total) by mouth 3 (three) times daily.  Dispense: 30 capsule; Refill: 0   Dr. [facility]  09/23/2018

## 2018-09-30 ENCOUNTER — Other Ambulatory Visit: Payer: Self-pay

## 2018-11-14 ENCOUNTER — Ambulatory Visit: Payer: Medicare Other

## 2018-12-07 ENCOUNTER — Ambulatory Visit: Payer: Medicare Other

## 2018-12-12 DIAGNOSIS — H40003 Preglaucoma, unspecified, bilateral: Secondary | ICD-10-CM | POA: Diagnosis not present

## 2018-12-19 ENCOUNTER — Other Ambulatory Visit
Admission: RE | Admit: 2018-12-19 | Discharge: 2018-12-19 | Disposition: A | Discharge status: Home / Self Care | Payer: Medicare Other | Source: Home / Self Care | Attending: Family Medicine | Admitting: Family Medicine

## 2018-12-19 ENCOUNTER — Ambulatory Visit
Admission: RE | Admit: 2018-12-19 | Discharge: 2018-12-19 | Disposition: A | Discharge status: Home / Self Care | Payer: Medicare Other | Source: Ambulatory Visit | Attending: Family Medicine | Admitting: Family Medicine

## 2018-12-19 ENCOUNTER — Encounter: Payer: Self-pay | Admitting: Family Medicine

## 2018-12-19 ENCOUNTER — Ambulatory Visit
Admission: RE | Admit: 2018-12-19 | Discharge: 2018-12-19 | Disposition: A | Discharge status: Home / Self Care | Payer: Medicare Other | Attending: Family Medicine | Admitting: Family Medicine

## 2018-12-19 ENCOUNTER — Ambulatory Visit (INDEPENDENT_AMBULATORY_CARE_PROVIDER_SITE_OTHER): Payer: Medicare Other | Admitting: Family Medicine

## 2018-12-19 VITALS — BP 120/80 | HR 100 | Temp 99.4°F | Ht 67.0 in | Wt 229.0 lb

## 2018-12-19 DIAGNOSIS — R0989 Other specified symptoms and signs involving the circulatory and respiratory systems: Secondary | ICD-10-CM | POA: Insufficient documentation

## 2018-12-19 DIAGNOSIS — J101 Influenza due to other identified influenza virus with other respiratory manifestations: Secondary | ICD-10-CM | POA: Diagnosis not present

## 2018-12-19 DIAGNOSIS — E119 Type 2 diabetes mellitus without complications: Secondary | ICD-10-CM | POA: Diagnosis not present

## 2018-12-19 DIAGNOSIS — R05 Cough: Secondary | ICD-10-CM | POA: Diagnosis not present

## 2018-12-19 DIAGNOSIS — H40053 Ocular hypertension, bilateral: Secondary | ICD-10-CM | POA: Diagnosis not present

## 2018-12-19 LAB — CBC WITH DIFFERENTIAL/PLATELET
ABS IMMATURE GRANULOCYTES: 0.04 10*3/uL (ref 0.00–0.07)
Basophils Absolute: 0.1 10*3/uL (ref 0.0–0.1)
Basophils Relative: 1 %
Eosinophils Absolute: 0.1 10*3/uL (ref 0.0–0.5)
Eosinophils Relative: 1 %
HCT: 41 % (ref 39.0–52.0)
Hemoglobin: 14.5 g/dL (ref 13.0–17.0)
Immature Granulocytes: 1 %
Lymphocytes Relative: 17 %
Lymphs Abs: 1 10*3/uL (ref 0.7–4.0)
MCH: 30.6 pg (ref 26.0–34.0)
MCHC: 35.4 g/dL (ref 30.0–36.0)
MCV: 86.5 fL (ref 80.0–100.0)
MONO ABS: 0.9 10*3/uL (ref 0.1–1.0)
Monocytes Relative: 14 %
Neutro Abs: 4 10*3/uL (ref 1.7–7.7)
Neutrophils Relative %: 66 %
Platelets: 168 10*3/uL (ref 150–400)
RBC: 4.74 MIL/uL (ref 4.22–5.81)
RDW: 13 % (ref 11.5–15.5)
WBC: 6.1 10*3/uL (ref 4.0–10.5)
nRBC: 0 % (ref 0.0–0.2)

## 2018-12-19 LAB — POCT INFLUENZA A/B
INFLUENZA A, POC: POSITIVE — AB
Influenza B, POC: NEGATIVE

## 2018-12-19 MED ORDER — OSELTAMIVIR PHOSPHATE 75 MG PO CAPS: 75.0000 mg | ORAL_CAPSULE | Freq: Two times a day (BID) | ORAL | 0 refills | Status: DC

## 2018-12-19 NOTE — Progress Notes (Signed)
Date:  12/19/2018   Name:  Daniel Kirby   DOB:  [birth year]   MRN:  [id number]   Chief Complaint: Fever (cough with clear production, fever, body aches- started Friday)  Fever   This is a new problem. The current episode started in the past 7 days (Friday). The problem occurs intermittently. The problem has been waxing and waning. The maximum temperature noted was 101 to 101.9 F. The temperature was taken using an oral thermometer. Associated symptoms include coughing. Pertinent negatives include no abdominal pain, chest pain, congestion, diarrhea, ear pain, headaches, muscle aches, nausea, rash, sleepiness, sore throat, urinary pain, vomiting or wheezing. The treatment provided mild relief.  Risk factors: no contaminated food, no contaminated water, no hx of cancer, no immunosuppression, no occupational exposure, no recent sickness, no recent travel and no sick contacts     Review of Systems  Constitutional: Positive for fever. Negative for chills.  HENT: Negative for congestion, drooling, ear discharge, ear pain and sore throat.   Respiratory: Positive for cough. Negative for shortness of breath and wheezing.   Cardiovascular: Negative for chest pain, palpitations and leg swelling.  Gastrointestinal: Negative for abdominal pain, blood in stool, constipation, diarrhea, nausea and vomiting.  Endocrine: Negative for polydipsia.  Genitourinary: Negative for dysuria, frequency, hematuria and urgency.  Musculoskeletal: Negative for back pain, myalgias and neck pain.  Skin: Negative for rash.  Allergic/Immunologic: Negative for environmental allergies.  Neurological: Negative for dizziness and headaches.  Hematological: Does not bruise/bleed easily.  Psychiatric/Behavioral: Negative for suicidal ideas. The patient is not nervous/anxious.     Patient Active Problem List   Diagnosis Date Noted  . Type 2 diabetes mellitus without complication, without long-term current use of insulin ([place])  01/18/2018  . Chronic low back pain without sciatica 06/08/2017  . Essential hypertension 12/08/2016  . Adult hypothyroidism 12/08/2016  . Arthritis 12/08/2016  . Acute anxiety 12/08/2016  . Cervical radiculopathy 12/08/2016  . Mixed hyperlipidemia 12/08/2016  . Special screening for malignant neoplasms, colon   . Benign neoplasm of ascending colon   . Major depressive disorder with single episode, in partial remission ([place]) 08/20/2015  . Erectile dysfunction 06/04/2015  . History of prostate cancer 06/04/2015    No Known Allergies  Past Surgical History:  Procedure Laterality Date  . COLONOSCOPY  2006   normal  . COLONOSCOPY WITH PROPOFOL N/A 09/06/2015   Procedure: COLONOSCOPY WITH PROPOFOL;  Surgeon: Lucilla Lame, MD;  Location: [facility];  Service: Endoscopy;  Laterality: N/A;  . POLYPECTOMY  09/06/2015   Procedure: POLYPECTOMY;  Surgeon: Lucilla Lame, MD;  Location: [facility];  Service: Endoscopy;;  . robotic prostatectomy  2006    Social History   Tobacco Use  . Smoking status: Former Smoker    Packs/day: 1.00    Years: 16.00    Pack years: 16.00    Types: Cigars  . Smokeless tobacco: Current User    Types: Snuff  . Tobacco comment: Quit 2007. Smoking cessationmaterials not required  Substance Use Topics  . Alcohol use: Yes    Alcohol/week: 0.0 standard drinks    Comment: occasional, 1-2 drinks per year  . Drug use: No     Medication list has been reviewed and updated.  Current Meds  Medication Sig  . benazepril-hydrochlorthiazide (LOTENSIN HCT) 10-12.5 MG tablet Take 1 tablet by mouth daily.  Marland Kitchen glimepiride (AMARYL) 2 MG tablet Take 1 tablet (2 mg total) by mouth daily with breakfast.  . levothyroxine (SYNTHROID, LEVOTHROID)  112 MCG tablet Take 1 tablet (112 mcg total) by mouth daily.  . meloxicam (MOBIC) 7.5 MG tablet TAKE 1 TABLET BY MOUTH ONCE DAILY  . metFORMIN (GLUCOPHAGE) 500 MG tablet Take 1 tablet (500 mg total) by mouth 2 (two)  times daily with a meal.  . oxybutynin (DITROPAN) 5 MG tablet Take 1 tablet (5 mg total) by mouth 3 (three) times daily.  . pravastatin (PRAVACHOL) 10 MG tablet Take 1 tablet (10 mg total) by mouth daily.  . sildenafil (VIAGRA) 100 MG tablet Take 1 tablet (100 mg total) by mouth daily as needed for erectile dysfunction. Take two hours prior to intercourse on an empty stomach    PHQ 2/9 Scores 09/23/2018 11/10/2017 06/08/2017 04/15/2016  PHQ - 2 Score 0 1 0 0  PHQ- 9 Score 0 - - -    Physical Exam Vitals signs and nursing note reviewed.  HENT:     Head: Normocephalic.     Right Ear: External ear normal.     Left Ear: External ear normal.     Nose: Nose normal.  Eyes:     General: No scleral icterus.       Right eye: No discharge.        Left eye: No discharge.     Conjunctiva/sclera: Conjunctivae normal.     Pupils: Pupils are equal, round, and reactive to light.  Neck:     Musculoskeletal: Normal range of motion and neck supple.     Thyroid: No thyromegaly.     Vascular: No JVD.     Trachea: No tracheal deviation.  Cardiovascular:     Rate and Rhythm: Normal rate and regular rhythm.     Heart sounds: Normal heart sounds. No murmur. No friction rub. No gallop.   Pulmonary:     Effort: No respiratory distress.     Breath sounds: Examination of the right-lower field reveals decreased breath sounds and rales. Decreased breath sounds and rales present. No wheezing or rhonchi.  Abdominal:     General: Bowel sounds are normal.     Palpations: Abdomen is soft. There is no mass.     Tenderness: There is no abdominal tenderness. There is no guarding or rebound.  Musculoskeletal: Normal range of motion.        General: No tenderness.     Right lower leg: No edema.     Left lower leg: No edema.  Lymphadenopathy:     Cervical: No cervical adenopathy.  Skin:    General: Skin is warm.     Findings: No rash.  Neurological:     Mental Status: He is alert and oriented to person, place,  and time.     Cranial Nerves: No cranial nerve deficit.     Deep Tendon Reflexes: Reflexes are normal and symmetric.     BP 120/80   Pulse 100   Temp 99.4 F (37.4 C) (Oral)   Ht 5\' 7"  (1.702 m)   Wt 229 lb (103.9 kg)   SpO2 96%   BMI 35.87 kg/m   Assessment and Plan: 1. Influenza A Acute. Onset two days ago. Positive for influenza A. Start Tamiflu/ draw cbc and STAT chest xray - oseltamivir (TAMIFLU) 75 MG capsule; Take 1 capsule (75 mg total) by mouth 2 (two) times daily.  Dispense: 10 capsule; Refill: 0 - POCT Influenza A/B  2. Type 2 diabetes mellitus without complication, without long-term current use of insulin ([facility]) Chronic. Controlled on meds  3. Rales Acute. STAT xray -  DG Chest 2 View; Future

## 2018-12-22 ENCOUNTER — Telehealth: Payer: Self-pay

## 2018-12-22 NOTE — Telephone Encounter (Signed)
Called with c/o cough. Had a positive flu test. Called in [facility] to [facility] 4 ounces with no additional refills

## 2018-12-25 ENCOUNTER — Other Ambulatory Visit: Payer: Self-pay | Admitting: Family Medicine

## 2018-12-25 DIAGNOSIS — M1990 Unspecified osteoarthritis, unspecified site: Secondary | ICD-10-CM

## 2018-12-28 ENCOUNTER — Ambulatory Visit: Payer: Medicare Other

## 2018-12-30 ENCOUNTER — Encounter: Payer: Self-pay | Admitting: Family Medicine

## 2018-12-30 ENCOUNTER — Ambulatory Visit (INDEPENDENT_AMBULATORY_CARE_PROVIDER_SITE_OTHER): Payer: Medicare Other | Admitting: Family Medicine

## 2018-12-30 VITALS — BP 132/70 | HR 112 | Temp 98.1°F | Ht 67.0 in | Wt 228.0 lb

## 2018-12-30 DIAGNOSIS — G933 Postviral fatigue syndrome: Secondary | ICD-10-CM

## 2018-12-30 DIAGNOSIS — J9811 Atelectasis: Secondary | ICD-10-CM

## 2018-12-30 DIAGNOSIS — J0100 Acute maxillary sinusitis, unspecified: Secondary | ICD-10-CM

## 2018-12-30 DIAGNOSIS — G9331 Postviral fatigue syndrome: Secondary | ICD-10-CM

## 2018-12-30 DIAGNOSIS — J40 Bronchitis, not specified as acute or chronic: Secondary | ICD-10-CM | POA: Diagnosis not present

## 2018-12-30 MED ORDER — HYDROCOD POLST-CPM POLST ER 10-8 MG/5ML PO SUER: 5.0000 mL | Freq: Two times a day (BID) | ORAL | 0 refills | Status: DC

## 2018-12-30 MED ORDER — LEVOFLOXACIN 500 MG PO TABS: 500.0000 mg | ORAL_TABLET | Freq: Every day | ORAL | 0 refills | Status: DC

## 2018-12-30 NOTE — Progress Notes (Signed)
Date:  12/30/2018   Name:  Daniel Kirby   DOB:  September 21, 1946   MRN:  [id number]   Chief Complaint: Follow-up (tested positive for flu on 12/19/2018- cough is still bad/ dry croopy cough. )  Cough  This is a new problem. The current episode started 1 to 4 weeks ago (3 weeks). The problem has been unchanged. The problem occurs constantly. The cough is productive of purulent sputum (some yellow). Associated symptoms include chest pain. Pertinent negatives include no chills, ear congestion, ear pain, eye redness, fever, headaches, heartburn, hemoptysis, myalgias, nasal congestion, postnasal drip, rash, rhinorrhea, sore throat, shortness of breath, sweats, weight loss or wheezing. Associated symptoms comments: Pain with cough. The symptoms are aggravated by lying down (cough spells). He has tried OTC cough suppressant and prescription cough suppressant for the symptoms. The treatment provided moderate relief. There is no history of environmental allergies.    Review of Systems  Constitutional: Negative for chills, fever and weight loss.  HENT: Negative for drooling, ear discharge, ear pain, mouth sores, postnasal drip, rhinorrhea, sinus pain and sore throat.   Eyes: Negative for pain and redness.  Respiratory: Positive for cough. Negative for hemoptysis, shortness of breath and wheezing.   Cardiovascular: Positive for chest pain. Negative for palpitations and leg swelling.  Gastrointestinal: Negative for abdominal pain, blood in stool, constipation, diarrhea, heartburn and nausea.  Endocrine: Negative for polydipsia.  Genitourinary: Negative for dysuria, frequency, hematuria and urgency.  Musculoskeletal: Negative for back pain, myalgias and neck pain.  Skin: Negative for rash.  Allergic/Immunologic: Negative for environmental allergies.  Neurological: Negative for dizziness and headaches.  Hematological: Does not bruise/bleed easily.  Psychiatric/Behavioral: Negative for suicidal ideas. The  patient is not nervous/anxious.     Patient Active Problem List   Diagnosis Date Noted  . Type 2 diabetes mellitus without complication, without long-term current use of insulin ([place]) 01/18/2018  . Chronic low back pain without sciatica 06/08/2017  . Essential hypertension 12/08/2016  . Adult hypothyroidism 12/08/2016  . Arthritis 12/08/2016  . Acute anxiety 12/08/2016  . Cervical radiculopathy 12/08/2016  . Mixed hyperlipidemia 12/08/2016  . Special screening for malignant neoplasms, colon   . Benign neoplasm of ascending colon   . Major depressive disorder with single episode, in partial remission ([place]) 08/20/2015  . Erectile dysfunction 06/04/2015  . History of prostate cancer 06/04/2015    No Known Allergies  Past Surgical History:  Procedure Laterality Date  . COLONOSCOPY  2006   normal  . COLONOSCOPY WITH PROPOFOL N/A 09/06/2015   Procedure: COLONOSCOPY WITH PROPOFOL;  Surgeon: Lucilla Lame, MD;  Location: [facility];  Service: Endoscopy;  Laterality: N/A;  . POLYPECTOMY  09/06/2015   Procedure: POLYPECTOMY;  Surgeon: Lucilla Lame, MD;  Location: [facility];  Service: Endoscopy;;  . robotic prostatectomy  2006    Social History   Tobacco Use  . Smoking status: Former Smoker    Packs/day: 1.00    Years: 16.00    Pack years: 16.00    Types: Cigars  . Smokeless tobacco: Current User    Types: Snuff  . Tobacco comment: Quit 2007. Smoking cessationmaterials not required  Substance Use Topics  . Alcohol use: Yes    Alcohol/week: 0.0 standard drinks    Comment: occasional, 1-2 drinks per year  . Drug use: No     Medication list has been reviewed and updated.  Current Meds  Medication Sig  . benazepril-hydrochlorthiazide (LOTENSIN HCT) 10-12.5 MG tablet Take 1 tablet  by mouth daily.  Marland Kitchen glimepiride (AMARYL) 2 MG tablet Take 1 tablet (2 mg total) by mouth daily with breakfast.  . levothyroxine (SYNTHROID, LEVOTHROID) 112 MCG tablet Take 1 tablet  (112 mcg total) by mouth daily.  . meloxicam (MOBIC) 7.5 MG tablet TAKE 1 TABLET BY MOUTH ONCE DAILY  . metFORMIN (GLUCOPHAGE) 500 MG tablet Take 1 tablet (500 mg total) by mouth 2 (two) times daily with a meal.  . oxybutynin (DITROPAN) 5 MG tablet Take 1 tablet (5 mg total) by mouth 3 (three) times daily.  . pravastatin (PRAVACHOL) 10 MG tablet Take 1 tablet (10 mg total) by mouth daily.  . sildenafil (VIAGRA) 100 MG tablet Take 1 tablet (100 mg total) by mouth daily as needed for erectile dysfunction. Take two hours prior to intercourse on an empty stomach    PHQ 2/9 Scores 09/23/2018 11/10/2017 06/08/2017 04/15/2016  PHQ - 2 Score 0 1 0 0  PHQ- 9 Score 0 - - -    Physical Exam Vitals signs and nursing note reviewed.  HENT:     Head: Normocephalic.     Right Ear: Tympanic membrane, ear canal and external ear normal.     Left Ear: Tympanic membrane, ear canal and external ear normal.     Nose:     Right Sinus: Maxillary sinus tenderness present.     Left Sinus: Maxillary sinus tenderness present.     Mouth/Throat:     Lips: Pink.     Mouth: Mucous membranes are moist.     Pharynx: Oropharyngeal exudate and posterior oropharyngeal erythema present.  Eyes:     General: No scleral icterus.       Right eye: No discharge.        Left eye: No discharge.     Conjunctiva/sclera: Conjunctivae normal.     Pupils: Pupils are equal, round, and reactive to light.  Neck:     Musculoskeletal: Normal range of motion and neck supple.     Thyroid: No thyromegaly.     Vascular: No JVD.     Trachea: No tracheal deviation.  Cardiovascular:     Rate and Rhythm: Normal rate and regular rhythm.     Heart sounds: Normal heart sounds. No murmur. No friction rub. No gallop.   Pulmonary:     Effort: No respiratory distress.     Breath sounds: Examination of the left-lower field reveals decreased breath sounds. Decreased breath sounds present. No wheezing or rales.  Abdominal:     General: Bowel sounds  are normal.     Palpations: Abdomen is soft. There is no mass.     Tenderness: There is no abdominal tenderness. There is no guarding or rebound.  Musculoskeletal: Normal range of motion.        General: No tenderness.  Lymphadenopathy:     Cervical: No cervical adenopathy.  Skin:    General: Skin is warm.     Findings: No rash.  Neurological:     Mental Status: He is alert and oriented to person, place, and time.     Cranial Nerves: No cranial nerve deficit.     Deep Tendon Reflexes: Reflexes are normal and symmetric.     BP 132/70   Pulse (!) 112   Temp 98.1 F (36.7 C) (Oral)   Ht 5\' 7"  (1.702 m)   Wt 228 lb (103.4 kg)   SpO2 98%   BMI 35.71 kg/m   Assessment and Plan: 1. Atelectasis of left lung On review of  x-rays from 2006 patient has a history of atelectasis and most recently was confirmed to have a small area of atelectasis in the past chest film from 1 week ago.  Patient is continued to cough without hypoxia.  2. Bronchitis Patient does have a persistent cough that is productive of yellow sputum for the past 3 weeks.  Recently was treated with Tamiflu but cough has continued and remain productive.  Tussionex was given for symptom relief. - chlorpheniramine-HYDROcodone (TUSSIONEX PENNKINETIC ER) 10-8 MG/5ML SUER; Take 5 mLs by mouth 2 (two) times daily.  Dispense: 100 mL; Refill: 0  3. Postviral (asthenic) syndrome Patient has a post viral fatigue syndrome with some mild dehydration.  Patient has been encouraged to maintain fluid intake.  4. Acute maxillary sinusitis, recurrence not specified Tenderness over the maxillary sinus with some postnasal purulent drainage noted as well. - chlorpheniramine-HYDROcodone (TUSSIONEX PENNKINETIC ER) 10-8 MG/5ML SUER; Take 5 mLs by mouth 2 (two) times daily.  Dispense: 100 mL; Refill: 0

## 2019-01-05 ENCOUNTER — Telehealth: Payer: Self-pay

## 2019-01-05 MED ORDER — BENZONATATE 100 MG PO CAPS: 100.0000 mg | ORAL_CAPSULE | Freq: Two times a day (BID) | ORAL | 0 refills | Status: DC | PRN

## 2019-01-10 NOTE — Telephone Encounter (Signed)
ERROR

## 2019-01-16 ENCOUNTER — Other Ambulatory Visit: Payer: Self-pay

## 2019-01-16 MED ORDER — BENZONATATE 100 MG PO CAPS: 100.0000 mg | ORAL_CAPSULE | Freq: Two times a day (BID) | ORAL | 0 refills | Status: DC | PRN

## 2019-02-07 ENCOUNTER — Other Ambulatory Visit: Payer: Self-pay | Admitting: Family Medicine

## 2019-02-07 DIAGNOSIS — E119 Type 2 diabetes mellitus without complications: Secondary | ICD-10-CM

## 2019-02-10 ENCOUNTER — Ambulatory Visit (INDEPENDENT_AMBULATORY_CARE_PROVIDER_SITE_OTHER): Payer: Medicare Other | Admitting: Family Medicine

## 2019-02-10 ENCOUNTER — Other Ambulatory Visit: Payer: Self-pay

## 2019-02-10 ENCOUNTER — Encounter: Payer: Self-pay | Admitting: Family Medicine

## 2019-02-10 VITALS — BP 120/80 | HR 80 | Temp 97.6°F | Ht 67.0 in | Wt 224.0 lb

## 2019-02-10 DIAGNOSIS — M1990 Unspecified osteoarthritis, unspecified site: Secondary | ICD-10-CM

## 2019-02-10 DIAGNOSIS — E119 Type 2 diabetes mellitus without complications: Secondary | ICD-10-CM

## 2019-02-10 DIAGNOSIS — I10 Essential (primary) hypertension: Secondary | ICD-10-CM | POA: Diagnosis not present

## 2019-02-10 DIAGNOSIS — N3941 Urge incontinence: Secondary | ICD-10-CM

## 2019-02-10 DIAGNOSIS — E782 Mixed hyperlipidemia: Secondary | ICD-10-CM | POA: Diagnosis not present

## 2019-02-10 DIAGNOSIS — E039 Hypothyroidism, unspecified: Secondary | ICD-10-CM | POA: Diagnosis not present

## 2019-02-10 MED ORDER — PRAVASTATIN SODIUM 10 MG PO TABS: 10.0000 mg | ORAL_TABLET | Freq: Every day | ORAL | 1 refills | Status: DC

## 2019-02-10 MED ORDER — GLIMEPIRIDE 2 MG PO TABS: ORAL_TABLET | ORAL | 1 refills | Status: DC

## 2019-02-10 MED ORDER — LEVOTHYROXINE SODIUM 112 MCG PO TABS: 112.0000 ug | ORAL_TABLET | Freq: Every day | ORAL | 1 refills | Status: DC

## 2019-02-10 MED ORDER — LOSARTAN POTASSIUM-HCTZ 50-12.5 MG PO TABS: 1.0000 | ORAL_TABLET | Freq: Every day | ORAL | 1 refills | Status: DC

## 2019-02-10 MED ORDER — MELOXICAM 7.5 MG PO TABS: 7.5000 mg | ORAL_TABLET | Freq: Every day | ORAL | 1 refills | Status: DC

## 2019-02-10 MED ORDER — METFORMIN HCL 500 MG PO TABS: 500.0000 mg | ORAL_TABLET | Freq: Two times a day (BID) | ORAL | 1 refills | Status: DC

## 2019-02-10 MED ORDER — OXYBUTYNIN CHLORIDE 5 MG PO TABS: 5.0000 mg | ORAL_TABLET | Freq: Three times a day (TID) | ORAL | 1 refills | Status: DC

## 2019-02-10 NOTE — Progress Notes (Signed)
Date:  02/10/2019   Name:  Daniel Kirby   DOB:  December 31, 1945   MRN:  [id number]   Chief Complaint: Hyperlipidemia; Hypertension; Hypothyroidism; Diabetes; urge incontinence (takes Oxybutinin); and Cough (clear production, nasal congestion- had flu in Feb)  Hyperlipidemia  This is a chronic problem. The current episode started more than 1 year ago. The problem is controlled. Recent lipid tests were reviewed and are normal. Exacerbating diseases include diabetes, hypothyroidism and obesity. He has no history of chronic renal disease, liver disease or nephrotic syndrome. Factors aggravating his hyperlipidemia include thiazides. Pertinent negatives include no chest pain, focal sensory loss, focal weakness, leg pain, myalgias or shortness of breath. Current antihyperlipidemic treatment includes statins. The current treatment provides moderate improvement of lipids. There are no compliance problems.  Risk factors for coronary artery disease include dyslipidemia, diabetes mellitus, hypertension, male sex and obesity.  Hypertension  This is a chronic problem. The current episode started more than 1 year ago. The problem is unchanged. The problem is controlled. Pertinent negatives include no anxiety, blurred vision, chest pain, headaches, malaise/fatigue, neck pain, orthopnea, palpitations, peripheral edema, PND, shortness of breath or sweats. There are no associated agents to hypertension. Past treatments include diuretics and ACE inhibitors. The current treatment provides moderate improvement. There are no compliance problems.  There is no history of angina, kidney disease, CAD/MI, CVA, heart failure, left ventricular hypertrophy, PVD or retinopathy. Identifiable causes of hypertension include a thyroid problem. There is no history of chronic renal disease or renovascular disease.  Diabetes  He presents for his follow-up diabetic visit. He has type 2 diabetes mellitus. His disease course has been stable.  Pertinent negatives for hypoglycemia include no confusion, dizziness, headaches, hunger, mood changes, nervousness/anxiousness, pallor, seizures, sleepiness, speech difficulty, sweats or tremors. Pertinent negatives for diabetes include no blurred vision, no chest pain, no fatigue, no foot paresthesias, no foot ulcerations, no polydipsia, no polyphagia, no polyuria, no visual change and no weight loss. There are no hypoglycemic complications. Symptoms are stable. There are no diabetic complications. Pertinent negatives for diabetic complications include no CVA, PVD or retinopathy. Risk factors for coronary artery disease include dyslipidemia and obesity. He is compliant with treatment all of the time. His weight is stable. He is following a generally healthy diet. Meal planning includes avoidance of concentrated sweets and carbohydrate counting. He participates in exercise intermittently. Home blood sugar record trend: not checking. An ACE inhibitor/angiotensin II receptor blocker is being taken.  Cough  This is a chronic problem. The current episode started more than 1 year ago. The problem has been gradually worsening. The problem occurs constantly. The cough is productive of purulent sputum. Pertinent negatives include no chest pain, chills, ear congestion, ear pain, fever, headaches, heartburn, hemoptysis, myalgias, nasal congestion, postnasal drip, rash, rhinorrhea, sore throat, shortness of breath, sweats, weight loss or wheezing. Nothing aggravates the symptoms. He has tried nothing for the symptoms. There is no history of environmental allergies. atelectasis  Thyroid Problem  Presents for follow-up visit. Symptoms include weight gain. Patient reports no anxiety, cold intolerance, constipation, depressed mood, diaphoresis, diarrhea, fatigue, hair loss, heat intolerance, hoarse voice, leg swelling, nail problem, palpitations, tremors, visual change or weight loss. The symptoms have been stable. His past  medical history is significant for diabetes and hyperlipidemia. There is no history of heart failure. (Atelectasis)  Benign Prostatic Hypertrophy  This is a chronic problem. The problem is unchanged. Irritative symptoms do not include frequency or urgency. Obstructive symptoms do not include  dribbling, incomplete emptying, an intermittent stream, a slower stream, straining or a weak stream. Pertinent negatives include no chills, dysuria, hematuria, hesitancy, nausea or vomiting. Treatments tried: ditropan. The treatment provided moderate relief.    Review of Systems  Constitutional: Positive for weight gain. Negative for chills, diaphoresis, fatigue, fever, malaise/fatigue and weight loss.  HENT: Negative for drooling, ear discharge, ear pain, hoarse voice, postnasal drip, rhinorrhea and sore throat.   Eyes: Negative for blurred vision.  Respiratory: Positive for cough. Negative for hemoptysis, shortness of breath and wheezing.   Cardiovascular: Negative for chest pain, palpitations, orthopnea, leg swelling and PND.  Gastrointestinal: Negative for abdominal pain, blood in stool, constipation, diarrhea, heartburn, nausea and vomiting.  Endocrine: Negative for cold intolerance, heat intolerance, polydipsia, polyphagia and polyuria.  Genitourinary: Negative for dysuria, frequency, hematuria, hesitancy, incomplete emptying and urgency.  Musculoskeletal: Negative for back pain, myalgias and neck pain.  Skin: Negative for pallor and rash.  Allergic/Immunologic: Negative for environmental allergies.  Neurological: Negative for dizziness, tremors, focal weakness, seizures, speech difficulty and headaches.  Hematological: Does not bruise/bleed easily.  Psychiatric/Behavioral: Negative for confusion and suicidal ideas. The patient is not nervous/anxious.     Patient Active Problem List   Diagnosis Date Noted  . Type 2 diabetes mellitus without complication, without long-term current use of insulin  ([place]) 01/18/2018  . Chronic low back pain without sciatica 06/08/2017  . Essential hypertension 12/08/2016  . Adult hypothyroidism 12/08/2016  . Arthritis 12/08/2016  . Acute anxiety 12/08/2016  . Cervical radiculopathy 12/08/2016  . Mixed hyperlipidemia 12/08/2016  . Special screening for malignant neoplasms, colon   . Benign neoplasm of ascending colon   . Major depressive disorder with single episode, in partial remission ([place]) 08/20/2015  . Erectile dysfunction 06/04/2015  . History of prostate cancer 06/04/2015    No Known Allergies  Past Surgical History:  Procedure Laterality Date  . COLONOSCOPY  2006   normal  . COLONOSCOPY WITH PROPOFOL N/A 09/06/2015   Procedure: COLONOSCOPY WITH PROPOFOL;  Surgeon: Lucilla Lame, MD;  Location: [facility];  Service: Endoscopy;  Laterality: N/A;  . POLYPECTOMY  09/06/2015   Procedure: POLYPECTOMY;  Surgeon: Lucilla Lame, MD;  Location: [facility];  Service: Endoscopy;;  . robotic prostatectomy  2006    Social History   Tobacco Use  . Smoking status: Former Smoker    Packs/day: 1.00    Years: 16.00    Pack years: 16.00    Types: Cigars  . Smokeless tobacco: Current User    Types: Snuff  . Tobacco comment: Quit 2007. Smoking cessationmaterials not required  Substance Use Topics  . Alcohol use: Yes    Alcohol/week: 0.0 standard drinks    Comment: occasional, 1-2 drinks per year  . Drug use: No     Medication list has been reviewed and updated.  Current Meds  Medication Sig  . benazepril-hydrochlorthiazide (LOTENSIN HCT) 10-12.5 MG tablet Take 1 tablet by mouth daily.  Marland Kitchen glimepiride (AMARYL) 2 MG tablet Take 1 tablet by mouth once daily with breakfast  . levothyroxine (SYNTHROID, LEVOTHROID) 112 MCG tablet Take 1 tablet (112 mcg total) by mouth daily.  . meloxicam (MOBIC) 7.5 MG tablet TAKE 1 TABLET BY MOUTH ONCE DAILY  . metFORMIN (GLUCOPHAGE) 500 MG tablet Take 1 tablet (500 mg total) by mouth 2 (two)  times daily with a meal.  . oxybutynin (DITROPAN) 5 MG tablet Take 1 tablet (5 mg total) by mouth 3 (three) times daily.  . pravastatin (PRAVACHOL) 10  MG tablet Take 1 tablet (10 mg total) by mouth daily.  . sildenafil (VIAGRA) 100 MG tablet Take 1 tablet (100 mg total) by mouth daily as needed for erectile dysfunction. Take two hours prior to intercourse on an empty stomach    PHQ 2/9 Scores 09/23/2018 11/10/2017 06/08/2017 04/15/2016  PHQ - 2 Score 0 1 0 0  PHQ- 9 Score 0 - - -    BP Readings from Last 3 Encounters:  02/10/19 120/80  12/30/18 132/70  12/19/18 120/80    Physical Exam Vitals signs and nursing note reviewed.  Constitutional:      Appearance: He is obese.  HENT:     Head: Normocephalic.     Right Ear: Tympanic membrane, ear canal and external ear normal.     Left Ear: Tympanic membrane, ear canal and external ear normal.     Nose: Nose normal. No congestion or rhinorrhea.     Mouth/Throat:     Mouth: Mucous membranes are moist.  Eyes:     General: No scleral icterus.       Right eye: No discharge.        Left eye: No discharge.     Conjunctiva/sclera: Conjunctivae normal.     Pupils: Pupils are equal, round, and reactive to light.  Neck:     Musculoskeletal: Normal range of motion and neck supple.     Thyroid: No thyromegaly.     Vascular: No JVD.     Trachea: No tracheal deviation.  Cardiovascular:     Rate and Rhythm: Normal rate and regular rhythm.     Pulses: Normal pulses.     Heart sounds: Normal heart sounds. No murmur. No friction rub. No gallop.   Pulmonary:     Effort: Pulmonary effort is normal. No respiratory distress.     Breath sounds: Normal breath sounds. No wheezing, rhonchi or rales.  Chest:     Chest wall: No tenderness.  Abdominal:     General: Bowel sounds are normal.     Palpations: Abdomen is soft. There is no mass.     Tenderness: There is no abdominal tenderness. There is no guarding or rebound.  Musculoskeletal: Normal range of  motion.        General: No swelling or tenderness.  Lymphadenopathy:     Cervical: No cervical adenopathy.  Skin:    General: Skin is warm.     Coloration: Skin is not jaundiced or pale.     Findings: No bruising, erythema, lesion or rash.  Neurological:     Mental Status: He is alert and oriented to person, place, and time.     Cranial Nerves: No cranial nerve deficit.     Deep Tendon Reflexes: Reflexes are normal and symmetric.     Wt Readings from Last 3 Encounters:  02/10/19 224 lb (101.6 kg)  12/30/18 228 lb (103.4 kg)  12/19/18 229 lb (103.9 kg)    BP 120/80   Pulse 80   Temp 97.6 F (36.4 C) (Oral)   Ht 5\' 7"  (1.702 m)   Wt 224 lb (101.6 kg)   SpO2 97%   BMI 35.08 kg/m   Assessment and Plan: 1. Urge incontinence of urine Chronic.  Controlled.  Continue Ditropan 5 mg 1 twice a day is how the patient takes it - oxybutynin (DITROPAN) 5 MG tablet; Take 1 tablet (5 mg total) by mouth 3 (three) times daily.  Dispense: 180 tablet; Refill: 1  2. Type 2 diabetes mellitus without complication,  without long-term current use of insulin (H[facility]) Chronic.  Controlled.  Continue Amaryl 2 mg once a day with breakfast and metformin 500 mg twice a day.  Microalbumin, urineand renal panel obtained. - Renal Function Panel - glimepiride (AMARYL) 2 MG tablet; Take 1 tablet by mouth once daily with breakfast  Dispense: 90 tablet; Refill: 1 - metFORMIN (GLUCOPHAGE) 500 MG tablet; Take 1 tablet (500 mg total) by mouth 2 (two) times daily with a meal.  Dispense: 180 tablet; Refill: 1  3. Mixed hyperlipidemia Cont pravachol 10 mg. - Lipid Panel With LDL/HDL Ratio - pravastatin (PRAVACHOL) 10 MG tablet; Take 1 tablet (10 mg total) by mouth daily.  Dispense: 90 tablet; Refill: 1  4. Arthritis Continue meloxicam 7.5 daily. - meloxicam (MOBIC) 7.5 MG tablet; Take 1 tablet (7.5 mg total) by mouth daily.  Dispense: 90 tablet; Refill: 1  5. Adult hypothyroidism Check tsh adjust accordingly  pending TSH. - TSH - levothyroxine (SYNTHROID, LEVOTHROID) 112 MCG tablet; Take 1 tablet (112 mcg total) by mouth daily.  Dispense: 90 tablet; Refill: 1  6. Essential hypertension Chronic. Controlled.  Continue hyzaar 50-12.5 mg daily instead to perhaps decrease cough. - losartan-hydrochlorothiazide (HYZAAR) 50-12.5 MG tablet; Take 1 tablet by mouth daily.  Dispense: 90 tablet; Refill: 1

## 2019-02-11 LAB — RENAL FUNCTION PANEL
Albumin: 4.4 g/dL (ref 3.7–4.7)
BUN/Creatinine Ratio: 13 (ref 10–24)
BUN: 11 mg/dL (ref 8–27)
CO2: 23 mmol/L (ref 20–29)
Calcium: 9.6 mg/dL (ref 8.6–10.2)
Chloride: 98 mmol/L (ref 96–106)
Creatinine, Ser: 0.87 mg/dL (ref 0.76–1.27)
GFR calc Af Amer: 99 mL/min/{1.73_m2} (ref >59)
GFR calc non Af Amer: 86 mL/min/{1.73_m2} (ref >59)
GLUCOSE: 133 mg/dL — AB (ref 65–99)
Phosphorus: 3.1 mg/dL (ref 2.8–4.1)
Potassium: 3.8 mmol/L (ref 3.5–5.2)
Sodium: 139 mmol/L (ref 134–144)

## 2019-02-11 LAB — LIPID PANEL WITH LDL/HDL RATIO
Cholesterol, Total: 158 mg/dL (ref 100–199)
HDL: 34 mg/dL — ABNORMAL LOW (ref >39)
LDL Calculated: 88 mg/dL (ref 0–99)
LDL/HDL RATIO: 2.6 ratio (ref 0.0–3.6)
Triglycerides: 181 mg/dL — ABNORMAL HIGH (ref 0–149)
VLDL Cholesterol Cal: 36 mg/dL (ref 5–40)

## 2019-02-11 LAB — MICROALBUMIN, URINE: Microalbumin, Urine: 10 ug/mL

## 2019-02-11 LAB — TSH: TSH: 1.68 u[IU]/mL (ref 0.450–4.500)

## 2019-02-22 ENCOUNTER — Encounter: Payer: Self-pay | Admitting: Family Medicine

## 2019-02-22 ENCOUNTER — Other Ambulatory Visit: Payer: Self-pay

## 2019-02-22 ENCOUNTER — Ambulatory Visit (INDEPENDENT_AMBULATORY_CARE_PROVIDER_SITE_OTHER): Payer: Medicare Other | Admitting: Family Medicine

## 2019-02-22 VITALS — BP 130/70 | HR 96 | Ht 67.0 in | Wt 220.0 lb

## 2019-02-22 DIAGNOSIS — M778 Other enthesopathies, not elsewhere classified: Secondary | ICD-10-CM

## 2019-02-22 DIAGNOSIS — M7582 Other shoulder lesions, left shoulder: Secondary | ICD-10-CM | POA: Diagnosis not present

## 2019-02-22 DIAGNOSIS — M25512 Pain in left shoulder: Secondary | ICD-10-CM | POA: Diagnosis not present

## 2019-02-22 NOTE — Progress Notes (Signed)
Date:  02/22/2019   Name:  Daniel Kirby   DOB:  [birth year]   MRN:  [id number]   Chief Complaint: Shoulder Pain (L) shoulder pain- started yesterday. Tingling in the L) index finger . Taking meloxicam- doesn't help)  Shoulder Pain   The pain is present in the left shoulder. This is a new problem. The current episode started yesterday. There has been no history of extremity trauma. The problem occurs constantly. The problem has been waxing and waning. The pain is at a severity of 7/10. The pain is moderate. Associated symptoms include a limited range of motion, numbness, stiffness and tingling. Pertinent negatives include no fever, inability to bear weight, itching, joint locking or joint swelling. The symptoms are aggravated by activity. He has tried NSAIDS for the symptoms. The treatment provided moderate relief. Family history does not include gout or rheumatoid arthritis. His past medical history is significant for osteoarthritis.    Review of Systems  Constitutional: Negative for chills and fever.  HENT: Negative for drooling, ear discharge, ear pain and sore throat.   Respiratory: Negative for cough, shortness of breath and wheezing.   Cardiovascular: Negative for chest pain, palpitations and leg swelling.  Gastrointestinal: Negative for abdominal pain, blood in stool, constipation, diarrhea and nausea.  Endocrine: Negative for polydipsia.  Genitourinary: Negative for dysuria, frequency, hematuria and urgency.  Musculoskeletal: Positive for stiffness. Negative for arthralgias, back pain, gait problem, joint swelling, myalgias and neck pain.  Skin: Negative for itching and rash.  Allergic/Immunologic: Negative for environmental allergies.  Neurological: Positive for tingling and numbness. Negative for dizziness and headaches.  Hematological: Does not bruise/bleed easily.  Psychiatric/Behavioral: Negative for suicidal ideas. The patient is not nervous/anxious.     Patient Active  Problem List   Diagnosis Date Noted  . Type 2 diabetes mellitus without complication, without long-term current use of insulin ([place]) 01/18/2018  . Chronic low back pain without sciatica 06/08/2017  . Essential hypertension 12/08/2016  . Adult hypothyroidism 12/08/2016  . Arthritis 12/08/2016  . Acute anxiety 12/08/2016  . Cervical radiculopathy 12/08/2016  . Mixed hyperlipidemia 12/08/2016  . Special screening for malignant neoplasms, colon   . Benign neoplasm of ascending colon   . Major depressive disorder with single episode, in partial remission ([place]) 08/20/2015  . Erectile dysfunction 06/04/2015  . History of prostate cancer 06/04/2015    No Known Allergies  Past Surgical History:  Procedure Laterality Date  . COLONOSCOPY  2006   normal  . COLONOSCOPY WITH PROPOFOL N/A 09/06/2015   Procedure: COLONOSCOPY WITH PROPOFOL;  Surgeon: Lucilla Lame, MD;  Location: [facility];  Service: Endoscopy;  Laterality: N/A;  . POLYPECTOMY  09/06/2015   Procedure: POLYPECTOMY;  Surgeon: Lucilla Lame, MD;  Location: [facility];  Service: Endoscopy;;  . robotic prostatectomy  2006    Social History   Tobacco Use  . Smoking status: Former Smoker    Packs/day: 1.00    Years: 16.00    Pack years: 16.00    Types: Cigars  . Smokeless tobacco: Current User    Types: Snuff  . Tobacco comment: Quit 2007. Smoking cessationmaterials not required  Substance Use Topics  . Alcohol use: Yes    Alcohol/week: 0.0 standard drinks    Comment: occasional, 1-2 drinks per year  . Drug use: No     Medication list has been reviewed and updated.  Current Meds  Medication Sig  . glimepiride (AMARYL) 2 MG tablet Take 1 tablet by mouth once  daily with breakfast  . levothyroxine (SYNTHROID, LEVOTHROID) 112 MCG tablet Take 1 tablet (112 mcg total) by mouth daily.  Marland Kitchen losartan-hydrochlorothiazide (HYZAAR) 50-12.5 MG tablet Take 1 tablet by mouth daily.  . meloxicam (MOBIC) 7.5 MG tablet  Take 1 tablet (7.5 mg total) by mouth daily.  . metFORMIN (GLUCOPHAGE) 500 MG tablet Take 1 tablet (500 mg total) by mouth 2 (two) times daily with a meal.  . oxybutynin (DITROPAN) 5 MG tablet Take 1 tablet (5 mg total) by mouth 3 (three) times daily.  . pravastatin (PRAVACHOL) 10 MG tablet Take 1 tablet (10 mg total) by mouth daily.  . sildenafil (VIAGRA) 100 MG tablet Take 1 tablet (100 mg total) by mouth daily as needed for erectile dysfunction. Take two hours prior to intercourse on an empty stomach    PHQ 2/9 Scores 09/23/2018 11/10/2017 06/08/2017 04/15/2016  PHQ - 2 Score 0 1 0 0  PHQ- 9 Score 0 - - -    BP Readings from Last 3 Encounters:  02/22/19 130/70  02/10/19 120/80  12/30/18 132/70    Physical Exam Vitals signs and nursing note reviewed.  Musculoskeletal:     Left shoulder: He exhibits tenderness and pain. He exhibits normal range of motion, no bony tenderness, no swelling, no effusion and no spasm.       Arms:     Wt Readings from Last 3 Encounters:  02/22/19 220 lb (99.8 kg)  02/10/19 224 lb (101.6 kg)  12/30/18 228 lb (103.4 kg)    BP 130/70   Pulse 96   Ht 5\' 7"  (1.702 m)   Wt 220 lb (99.8 kg)   BMI 34.46 kg/m   Assessment and Plan: 1. Tendonitis of shoulder, left New onset.  Acute.  And redness in the anterior aspect over the biceps tendon.  Patient is to continue meloxicam 15 mg once a day for pain.  Will refer to orthopedics for evaluation and if necessary injection. Ambulatory referral to Orthopedic Surgery

## 2019-03-09 DIAGNOSIS — E669 Obesity, unspecified: Secondary | ICD-10-CM | POA: Diagnosis not present

## 2019-03-09 DIAGNOSIS — M25519 Pain in unspecified shoulder: Secondary | ICD-10-CM | POA: Diagnosis not present

## 2019-03-09 DIAGNOSIS — E119 Type 2 diabetes mellitus without complications: Secondary | ICD-10-CM | POA: Diagnosis not present

## 2019-03-09 DIAGNOSIS — M5030 Other cervical disc degeneration, unspecified cervical region: Secondary | ICD-10-CM | POA: Diagnosis not present

## 2019-03-09 DIAGNOSIS — M5412 Radiculopathy, cervical region: Secondary | ICD-10-CM | POA: Diagnosis not present

## 2019-03-09 DIAGNOSIS — M50123 Cervical disc disorder at C6-C7 level with radiculopathy: Secondary | ICD-10-CM | POA: Diagnosis not present

## 2019-03-09 DIAGNOSIS — M542 Cervicalgia: Secondary | ICD-10-CM | POA: Diagnosis not present

## 2019-03-14 DIAGNOSIS — M542 Cervicalgia: Secondary | ICD-10-CM | POA: Diagnosis not present

## 2019-03-14 DIAGNOSIS — M5412 Radiculopathy, cervical region: Secondary | ICD-10-CM | POA: Diagnosis not present

## 2019-03-14 DIAGNOSIS — M25512 Pain in left shoulder: Secondary | ICD-10-CM | POA: Diagnosis not present

## 2019-03-14 DIAGNOSIS — M6281 Muscle weakness (generalized): Secondary | ICD-10-CM | POA: Diagnosis not present

## 2019-03-14 DIAGNOSIS — M5030 Other cervical disc degeneration, unspecified cervical region: Secondary | ICD-10-CM | POA: Diagnosis not present

## 2019-03-14 DIAGNOSIS — M436 Torticollis: Secondary | ICD-10-CM | POA: Diagnosis not present

## 2019-03-16 DIAGNOSIS — M436 Torticollis: Secondary | ICD-10-CM | POA: Diagnosis not present

## 2019-03-16 DIAGNOSIS — M6281 Muscle weakness (generalized): Secondary | ICD-10-CM | POA: Diagnosis not present

## 2019-03-16 DIAGNOSIS — M542 Cervicalgia: Secondary | ICD-10-CM | POA: Diagnosis not present

## 2019-03-16 DIAGNOSIS — M5412 Radiculopathy, cervical region: Secondary | ICD-10-CM | POA: Diagnosis not present

## 2019-03-16 DIAGNOSIS — M25512 Pain in left shoulder: Secondary | ICD-10-CM | POA: Diagnosis not present

## 2019-03-16 DIAGNOSIS — M5030 Other cervical disc degeneration, unspecified cervical region: Secondary | ICD-10-CM | POA: Diagnosis not present

## 2019-03-21 DIAGNOSIS — M5412 Radiculopathy, cervical region: Secondary | ICD-10-CM | POA: Diagnosis not present

## 2019-03-21 DIAGNOSIS — M436 Torticollis: Secondary | ICD-10-CM | POA: Diagnosis not present

## 2019-03-21 DIAGNOSIS — M5030 Other cervical disc degeneration, unspecified cervical region: Secondary | ICD-10-CM | POA: Diagnosis not present

## 2019-03-21 DIAGNOSIS — M542 Cervicalgia: Secondary | ICD-10-CM | POA: Diagnosis not present

## 2019-03-21 DIAGNOSIS — M6281 Muscle weakness (generalized): Secondary | ICD-10-CM | POA: Diagnosis not present

## 2019-03-21 DIAGNOSIS — M25512 Pain in left shoulder: Secondary | ICD-10-CM | POA: Diagnosis not present

## 2019-03-23 ENCOUNTER — Other Ambulatory Visit: Payer: Self-pay | Admitting: Orthopedic Surgery

## 2019-03-23 DIAGNOSIS — M5412 Radiculopathy, cervical region: Secondary | ICD-10-CM

## 2019-03-23 DIAGNOSIS — E119 Type 2 diabetes mellitus without complications: Secondary | ICD-10-CM | POA: Diagnosis not present

## 2019-03-23 DIAGNOSIS — M5030 Other cervical disc degeneration, unspecified cervical region: Secondary | ICD-10-CM

## 2019-03-23 DIAGNOSIS — E669 Obesity, unspecified: Secondary | ICD-10-CM | POA: Diagnosis not present

## 2019-03-23 DIAGNOSIS — M50123 Cervical disc disorder at C6-C7 level with radiculopathy: Secondary | ICD-10-CM

## 2019-03-27 ENCOUNTER — Other Ambulatory Visit: Payer: Self-pay

## 2019-03-27 ENCOUNTER — Ambulatory Visit
Admission: RE | Admit: 2019-03-27 | Discharge: 2019-03-27 | Disposition: A | Discharge status: Home / Self Care | Payer: Medicare Other | Source: Ambulatory Visit | Attending: Orthopedic Surgery | Admitting: Orthopedic Surgery

## 2019-03-27 DIAGNOSIS — M50123 Cervical disc disorder at C6-C7 level with radiculopathy: Secondary | ICD-10-CM | POA: Diagnosis not present

## 2019-03-27 DIAGNOSIS — M5412 Radiculopathy, cervical region: Secondary | ICD-10-CM | POA: Insufficient documentation

## 2019-03-27 DIAGNOSIS — M5030 Other cervical disc degeneration, unspecified cervical region: Secondary | ICD-10-CM | POA: Diagnosis not present

## 2019-03-27 DIAGNOSIS — M5021 Other cervical disc displacement,  high cervical region: Secondary | ICD-10-CM | POA: Diagnosis not present

## 2019-03-27 DIAGNOSIS — M50223 Other cervical disc displacement at C6-C7 level: Secondary | ICD-10-CM | POA: Diagnosis not present

## 2019-03-30 DIAGNOSIS — M5412 Radiculopathy, cervical region: Secondary | ICD-10-CM | POA: Diagnosis not present

## 2019-03-30 DIAGNOSIS — M6281 Muscle weakness (generalized): Secondary | ICD-10-CM | POA: Diagnosis not present

## 2019-03-30 DIAGNOSIS — M25512 Pain in left shoulder: Secondary | ICD-10-CM | POA: Diagnosis not present

## 2019-03-30 DIAGNOSIS — M542 Cervicalgia: Secondary | ICD-10-CM | POA: Diagnosis not present

## 2019-03-30 DIAGNOSIS — M436 Torticollis: Secondary | ICD-10-CM | POA: Diagnosis not present

## 2019-03-30 DIAGNOSIS — M5030 Other cervical disc degeneration, unspecified cervical region: Secondary | ICD-10-CM | POA: Diagnosis not present

## 2019-04-20 DIAGNOSIS — M5412 Radiculopathy, cervical region: Secondary | ICD-10-CM | POA: Diagnosis not present

## 2019-04-20 DIAGNOSIS — M5020 Other cervical disc displacement, unspecified cervical region: Secondary | ICD-10-CM | POA: Diagnosis not present

## 2019-05-01 ENCOUNTER — Ambulatory Visit: Payer: Medicare Other | Admitting: Urology

## 2019-05-04 ENCOUNTER — Ambulatory Visit: Payer: Medicare Other | Admitting: Urology

## 2019-05-17 ENCOUNTER — Ambulatory Visit: Payer: Medicare Other

## 2019-05-18 ENCOUNTER — Other Ambulatory Visit: Payer: Self-pay

## 2019-05-18 DIAGNOSIS — Z8546 Personal history of malignant neoplasm of prostate: Secondary | ICD-10-CM

## 2019-05-21 NOTE — Progress Notes (Signed)
05/22/2019 2:51 PM   Daniel Kirby Nov 09, 1946 [id number]  Referring provider: Juline Patch, MD [address] [place],  [place] [address]  Chief Complaint  Patient presents with  . Urinary Incontinence    1year    HPI: [name] is a 73 year old male with a history of prostate cancer and urge incontinence who presents today for a follow up.    History of prostate cancer Prostate cancer status post radical prostatectomy and adjuvant radiation therapy approximately 10 years ago.  PSA <0.1 ng/mL in 04/2018.    Urge incontinence His I PSS score today is 5/1.  His PVR is 20 mL.  His previous I PSS score was 10/2.  His previous PVR was 20 mL.  His main complaints today are urgency and nocturia.  Patient denies any gross hematuria, dysuria or suprapubic/flank pain.  Patient denies any fevers, chills, nausea or vomiting.   Patient found  benefit with oxybutynin 5 mg TID.     IPSS    Row Name 05/22/19 1400         International Prostate Symptom Score   How often have you had the sensation of not emptying your bladder?  Not at All     How often have you had to urinate less than every two hours?  Less than 1 in 5 times     How often have you found you stopped and started again several times when you urinated?  Not at All     How often have you found it difficult to postpone urination?  Less than half the time     How often have you had a weak urinary stream?  Not at All     How often have you had to strain to start urination?  Not at All     How many times did you typically get up at night to urinate?  2 Times     Total IPSS Score  5       Quality of Life due to urinary symptoms   If you were to spend the rest of your life with your urinary condition just the way it is now how would you feel about that?  Pleased        Score:  1-7 Mild 8-19 Moderate 20-35 Severe  Erectile dysfunction He has not injected himself with the Trimix in over six months due to the pain with  the injections.    PMH: Past Medical History:  Diagnosis Date  . Abdominal pain    RUQ  . Arthritis    right ankle  . ED (erectile dysfunction)   . HLD (hyperlipidemia)   . Hypertension   . Hypothyroid   . Prostate cancer ([facility])   . Vertigo    1 episode only, approx 1 month ago    Surgical History: Past Surgical History:  Procedure Laterality Date  . COLONOSCOPY  2006   normal  . COLONOSCOPY WITH PROPOFOL N/A 09/06/2015   Procedure: COLONOSCOPY WITH PROPOFOL;  Surgeon: Daniel Lame, MD;  Location: [facility];  Service: Endoscopy;  Laterality: N/A;  . POLYPECTOMY  09/06/2015   Procedure: POLYPECTOMY;  Surgeon: Daniel Lame, MD;  Location: [facility];  Service: Endoscopy;;  . robotic prostatectomy  2006    Home Medications:  Allergies as of 05/22/2019   No Known Allergies     Medication List       Accurate as of May 22, 2019  2:51 PM. If you  have any questions, ask your nurse or doctor.        glimepiride 2 MG tablet Commonly known as: AMARYL Take 1 tablet by mouth once daily with breakfast   levothyroxine 112 MCG tablet Commonly known as: SYNTHROID Take 1 tablet (112 mcg total) by mouth daily.   losartan-hydrochlorothiazide 50-12.5 MG tablet Commonly known as: HYZAAR Take 1 tablet by mouth daily.   meloxicam 7.5 MG tablet Commonly known as: MOBIC Take 1 tablet (7.5 mg total) by mouth daily.   metFORMIN 500 MG tablet Commonly known as: GLUCOPHAGE Take 1 tablet (500 mg total) by mouth 2 (two) times daily with a meal.   oxybutynin 5 MG tablet Commonly known as: DITROPAN Take 1 tablet (5 mg total) by mouth 3 (three) times daily.   pravastatin 10 MG tablet Commonly known as: PRAVACHOL Take 1 tablet (10 mg total) by mouth daily.   sildenafil 100 MG tablet Commonly known as: VIAGRA Take 1 tablet (100 mg total) by mouth daily as needed for erectile dysfunction. Take two hours prior to intercourse on an empty stomach       Allergies: No  Known Allergies  Family History: Family History  Problem Relation Age of Onset  . Heart attack Father   . Diabetes Mother   . Kidney disease Brother     Social History:  reports that he has quit smoking. His smoking use included cigars. He has a 16.00 pack-year smoking history. His smokeless tobacco use includes snuff. He reports current alcohol use. He reports that he does not use drugs.  ROS: UROLOGY Frequent Urination?: No Hard to postpone urination?: Yes Burning/pain with urination?: No Get up at night to urinate?: Yes Leakage of urine?: No Urine stream starts and stops?: No Trouble starting stream?: No Do you have to strain to urinate?: No Blood in urine?: No Urinary tract infection?: No Sexually transmitted disease?: No Injury to kidneys or bladder?: No Painful intercourse?: No Weak stream?: No Erection problems?: Yes Penile pain?: No  Gastrointestinal Nausea?: No Vomiting?: No Indigestion/heartburn?: No Diarrhea?: No Constipation?: No  Constitutional Fever: No Night sweats?: No Weight loss?: No Fatigue?: No  Skin Skin rash/lesions?: No Itching?: No  Eyes Blurred vision?: No Double vision?: No  Ears/Nose/Throat Sore throat?: No Sinus problems?: No  Hematologic/Lymphatic Swollen glands?: No Easy bruising?: No  Cardiovascular Leg swelling?: No Chest pain?: No  Respiratory Cough?: No Shortness of breath?: No  Endocrine Excessive thirst?: No  Musculoskeletal Back pain?: No Joint pain?: No  Neurological Headaches?: No Dizziness?: No  Psychologic Depression?: No Anxiety?: No  Physical Exam: BP 126/77   Pulse 80   Ht 5\' 7"  (1.702 m)   Wt 210 lb (95.3 kg)   BMI 32.89 kg/m   Constitutional:  Well nourished. Alert and oriented, No acute distress. HEENT: [place] AT, moist mucus membranes.  Trachea midline, no masses. Cardiovascular: No clubbing, cyanosis, or edema. Respiratory: Normal respiratory effort, no increased work of  breathing. GI: Abdomen is soft, non tender, non distended, no abdominal masses. Liver and spleen not palpable.  No hernias appreciated.  Stool sample for occult testing is not indicated.   GU: No CVA tenderness.  No bladder fullness or masses.  Patient with uncircumcised phallus.  Foreskin easily retracted.   Urethral meatus is patent.  No penile discharge. No penile lesions or rashes. Scrotum without lesions, cysts, rashes and/or edema.  Testicles are located scrotally bilaterally. No masses are appreciated in the testicles. Left and right epididymis are normal. Neurologic: Grossly intact, no  focal deficits, moving all 4 extremities. Psychiatric: Normal mood and affect.   Laboratory Data: Lab Results  Component Value Date   WBC 6.1 12/19/2018   HGB 14.5 12/19/2018   HCT 41.0 12/19/2018   MCV 86.5 12/19/2018   PLT 168 12/19/2018    Lab Results  Component Value Date   CREATININE 0.87 02/10/2019    Lab Results  Component Value Date   PSA1 <0.1 04/28/2018   PSA1 <0.1 04/27/2017   PSA1 <0.1 04/29/2016    No results found for: TESTOSTERONE  Lab Results  Component Value Date   HGBA1C 5.7 (H) 07/07/2018   I have reviewed the labs.  Pertinent Imaging Results for HARDEEP, REETZ (MRN [id number]) as of 05/22/2019 14:40  Ref. Range 05/22/2019 14:18  Scan Result Unknown 86ml    Assessment & Plan:    1. Urge incontinence Prescribed oxybutynin 5 mg tid BLADDER SCAN AMB NON-IMAGING RTC in one year for I PSS and PVR  2. Erectile dysfunction He would like to try the Trimix with the Lidocaine RTC in one year for exam  3. History of prostate cancer PSA pending  RTC in one year for PSA    Return in about 1 year (around 05/21/2020) for IPSS, PSA and exam.  Zara Council, [facility]  [place] [address] [place], [place] [address] [phone]

## 2019-05-22 ENCOUNTER — Other Ambulatory Visit
Admission: RE | Admit: 2019-05-22 | Discharge: 2019-05-22 | Disposition: A | Discharge status: Home / Self Care | Payer: Medicare Other | Attending: Urology | Admitting: Urology

## 2019-05-22 ENCOUNTER — Other Ambulatory Visit: Payer: Self-pay

## 2019-05-22 ENCOUNTER — Encounter: Payer: Self-pay | Admitting: Urology

## 2019-05-22 ENCOUNTER — Ambulatory Visit (INDEPENDENT_AMBULATORY_CARE_PROVIDER_SITE_OTHER): Payer: Medicare Other | Admitting: Urology

## 2019-05-22 ENCOUNTER — Telehealth: Payer: Self-pay | Admitting: Urology

## 2019-05-22 VITALS — BP 126/77 | HR 80 | Ht 67.0 in | Wt 210.0 lb

## 2019-05-22 DIAGNOSIS — N3941 Urge incontinence: Secondary | ICD-10-CM | POA: Diagnosis not present

## 2019-05-22 DIAGNOSIS — Z8546 Personal history of malignant neoplasm of prostate: Secondary | ICD-10-CM

## 2019-05-22 DIAGNOSIS — N5231 Erectile dysfunction following radical prostatectomy: Secondary | ICD-10-CM | POA: Diagnosis not present

## 2019-05-22 LAB — BLADDER SCAN AMB NON-IMAGING

## 2019-05-22 NOTE — Addendum Note (Signed)
Addended by: Memory Argue H on: 05/22/2019 03:12 PM   Modules accepted: Orders

## 2019-05-22 NOTE — Telephone Encounter (Signed)
Would you call in a prescription for Trimix with the Lidocaine to [facility]? He would like three of the 1 mL vials.

## 2019-05-23 LAB — PSA

## 2019-05-24 NOTE — Telephone Encounter (Signed)
Spoke to [facility] and did a Verbal order for Trimix with lidocaine 3 79ml vials. Patient notified.

## 2019-05-26 ENCOUNTER — Telehealth: Payer: Self-pay

## 2019-05-26 ENCOUNTER — Other Ambulatory Visit: Payer: Self-pay

## 2019-05-26 DIAGNOSIS — Z8546 Personal history of malignant neoplasm of prostate: Secondary | ICD-10-CM

## 2019-05-26 NOTE — Telephone Encounter (Signed)
-----   Message from Nori Riis, PA-C sent at 05/26/2019  2:43 PM EDT ----- Mr. Keats needs his blood drawn again for a PSA.

## 2019-05-26 NOTE — Telephone Encounter (Signed)
-----   Message from Nori Riis, PA-C sent at 05/26/2019  2:43 PM EDT ----- Mr. Savo needs his blood drawn again for a PSA.

## 2019-05-26 NOTE — Telephone Encounter (Signed)
Called and left message for patient to return call. Mychart message sent with request to contact office for PSA lab visit.

## 2019-05-26 NOTE — Telephone Encounter (Signed)
-----   Message from Nori Riis, PA-C sent at 05/26/2019  2:43 PM EDT ----- Mr. Clear needs his blood drawn again for a PSA.

## 2019-05-29 ENCOUNTER — Other Ambulatory Visit: Payer: Self-pay

## 2019-05-29 ENCOUNTER — Other Ambulatory Visit
Admission: RE | Admit: 2019-05-29 | Discharge: 2019-05-29 | Disposition: A | Discharge status: Home / Self Care | Payer: Medicare Other | Attending: Urology | Admitting: Urology

## 2019-05-29 DIAGNOSIS — Z8546 Personal history of malignant neoplasm of prostate: Secondary | ICD-10-CM | POA: Diagnosis not present

## 2019-05-29 LAB — PSA: Prostatic Specific Antigen: <0.01 ng/mL (ref 0.00–4.00)

## 2019-06-01 ENCOUNTER — Telehealth: Payer: Self-pay | Admitting: Family Medicine

## 2019-06-01 NOTE — Telephone Encounter (Signed)
Called to RE-schedule Medicare Annual Wellness Visit with Nurse Health Advisor.  ° °If patient returns call, please schedule AWV with NHA  °For any questions please contact:  °Kathryn Brown [phone] or skype at: [email or web address]  ° °

## 2019-06-02 DIAGNOSIS — M5412 Radiculopathy, cervical region: Secondary | ICD-10-CM | POA: Diagnosis not present

## 2019-06-02 DIAGNOSIS — E669 Obesity, unspecified: Secondary | ICD-10-CM | POA: Diagnosis not present

## 2019-06-02 DIAGNOSIS — E119 Type 2 diabetes mellitus without complications: Secondary | ICD-10-CM | POA: Diagnosis not present

## 2019-06-02 DIAGNOSIS — M5030 Other cervical disc degeneration, unspecified cervical region: Secondary | ICD-10-CM | POA: Diagnosis not present

## 2019-06-02 DIAGNOSIS — M50123 Cervical disc disorder at C6-C7 level with radiculopathy: Secondary | ICD-10-CM | POA: Diagnosis not present

## 2019-06-05 ENCOUNTER — Telehealth: Payer: Self-pay | Admitting: Radiology

## 2019-06-05 NOTE — Telephone Encounter (Signed)
LMOM to notify patient that his PSA was undetectable.  We will see him next year.

## 2019-06-05 NOTE — Telephone Encounter (Signed)
-----   Message from Nori Riis, PA-C sent at 06/05/2019  7:50 AM EDT ----- Please let Daniel Kirby know that his PSA was undetectable.  We will see him next year.

## 2019-06-15 IMAGING — CR DG LUMBAR SPINE COMPLETE 4+V
5 series · 5 of 5 positions shown · non-contrast
Comparison: No recent .

CLINICAL DATA: Abdominal pain.

EXAM:
LUMBAR SPINE - COMPLETE 4+ VIEW

[l-spine ap]
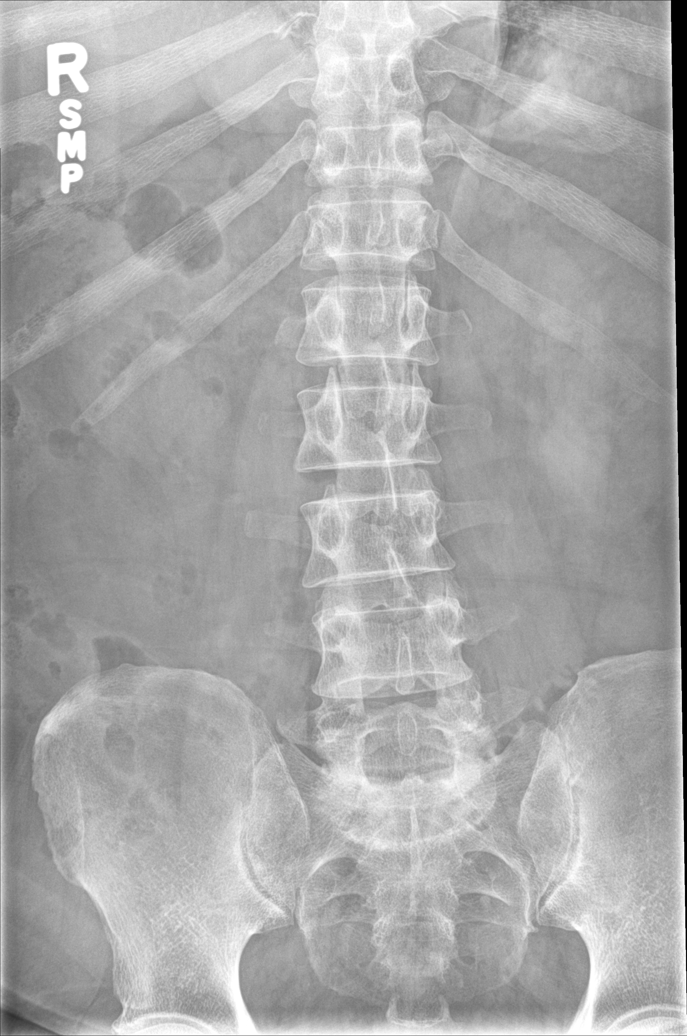

[l-spine obl (1 of 2)]
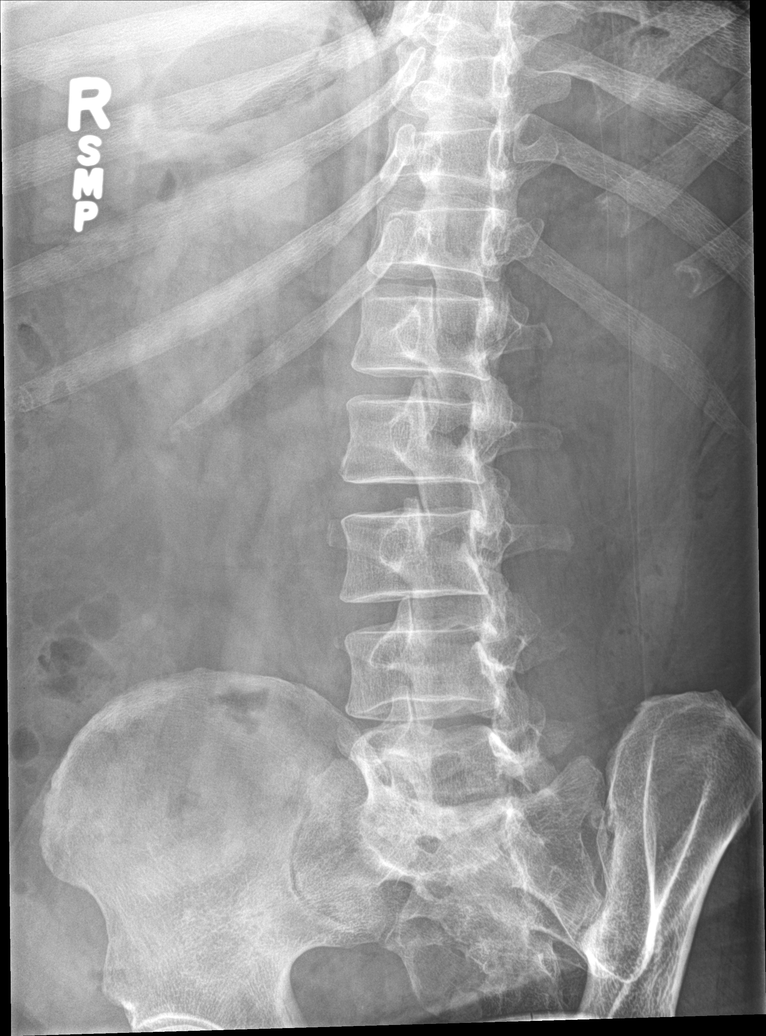

[l-spine obl (2 of 2)]
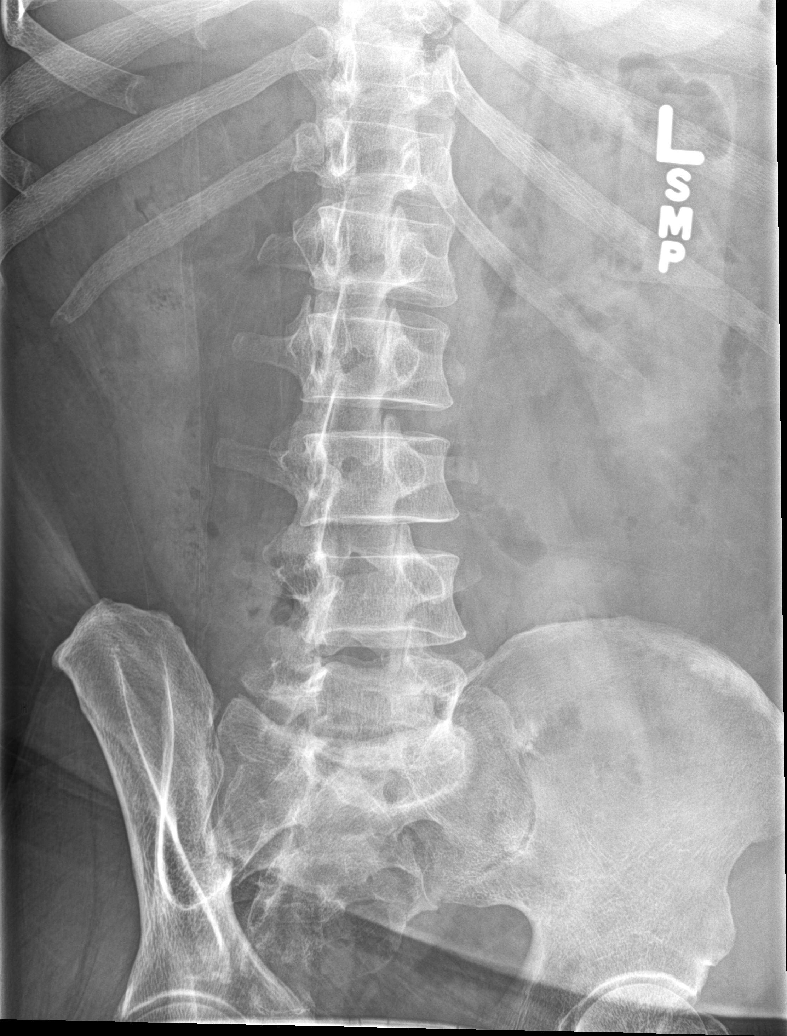

[l-spine lat]
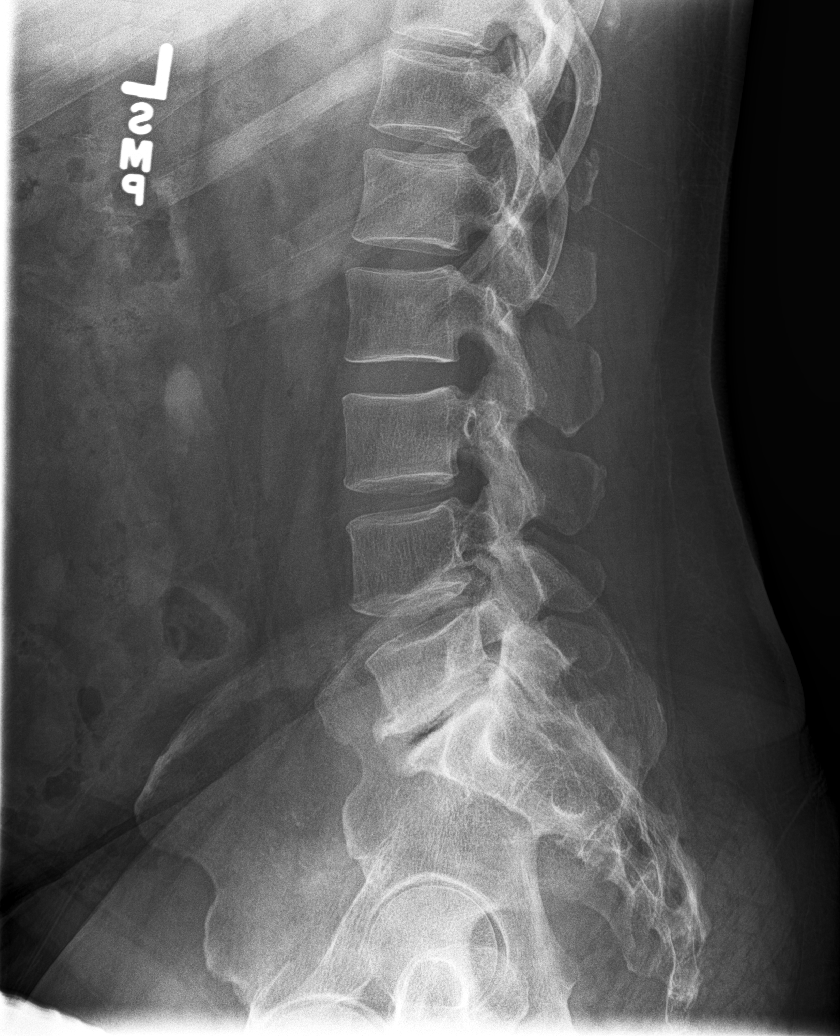

[l-spine spot]
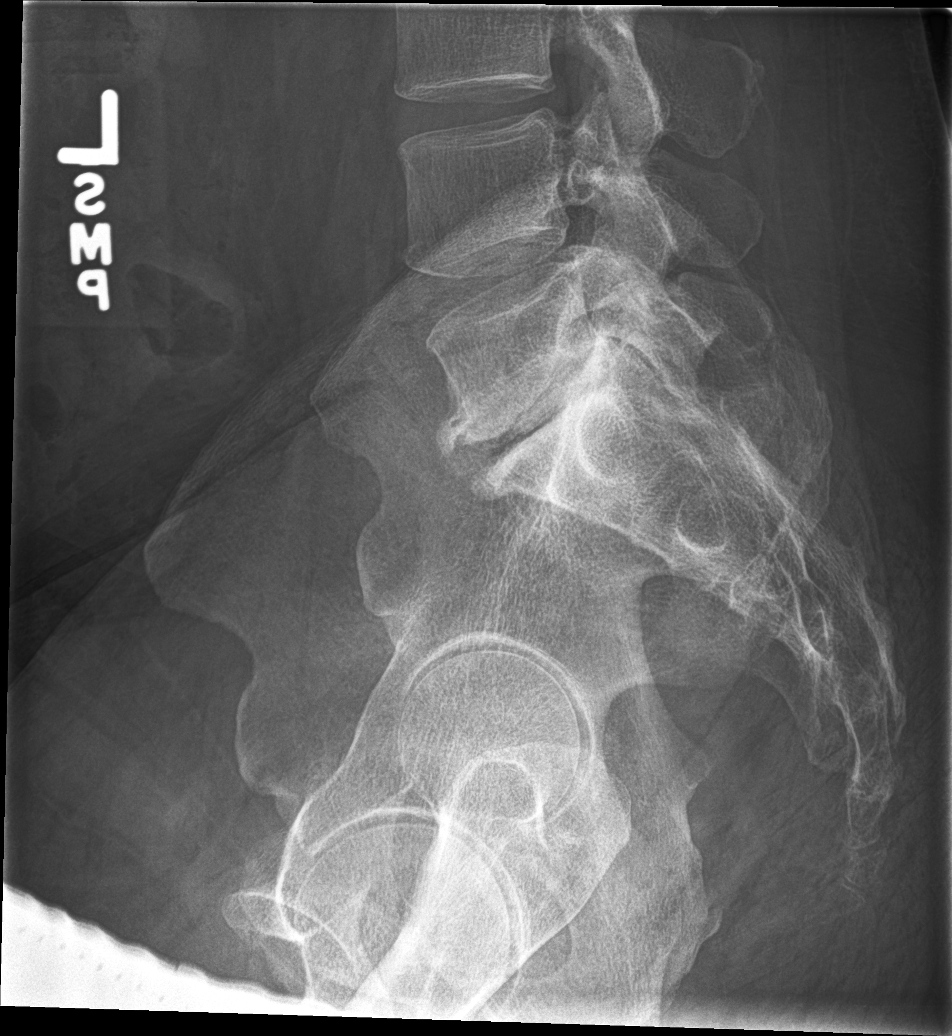

[5 of 5 positions shown; findings below may reference images not displayed]

FINDINGS: Lumbar vertebra numbered with the lowest appearing segmented lumbar
shaped vertebral lateral view as L5 . Mild lumbar spine scoliosis
concave left. Diffuse degenerative change. Degenerative changes most
prominent L5-S1. Bony abnormality identified. No evidence of
fracture.
IMPRESSION: No acute or focal bony abnormality identified. Degenerative changes
lumbar spine most prominent L5-S1.

## 2019-08-12 ENCOUNTER — Other Ambulatory Visit: Payer: Self-pay | Admitting: Family Medicine

## 2019-08-12 DIAGNOSIS — E119 Type 2 diabetes mellitus without complications: Secondary | ICD-10-CM

## 2019-08-12 DIAGNOSIS — I10 Essential (primary) hypertension: Secondary | ICD-10-CM

## 2019-08-17 ENCOUNTER — Encounter: Payer: Self-pay | Admitting: Family Medicine

## 2019-08-17 ENCOUNTER — Other Ambulatory Visit: Payer: Self-pay

## 2019-08-17 ENCOUNTER — Ambulatory Visit (INDEPENDENT_AMBULATORY_CARE_PROVIDER_SITE_OTHER): Payer: Medicare Other | Admitting: Family Medicine

## 2019-08-17 DIAGNOSIS — N3941 Urge incontinence: Secondary | ICD-10-CM | POA: Diagnosis not present

## 2019-08-17 DIAGNOSIS — E119 Type 2 diabetes mellitus without complications: Secondary | ICD-10-CM

## 2019-08-17 DIAGNOSIS — E039 Hypothyroidism, unspecified: Secondary | ICD-10-CM

## 2019-08-17 DIAGNOSIS — M1990 Unspecified osteoarthritis, unspecified site: Secondary | ICD-10-CM

## 2019-08-17 DIAGNOSIS — E782 Mixed hyperlipidemia: Secondary | ICD-10-CM

## 2019-08-17 DIAGNOSIS — I10 Essential (primary) hypertension: Secondary | ICD-10-CM

## 2019-08-17 MED ORDER — LOSARTAN POTASSIUM-HCTZ 50-12.5 MG PO TABS: 1.0000 | ORAL_TABLET | Freq: Every day | ORAL | 1 refills | Status: DC

## 2019-08-17 MED ORDER — SILDENAFIL CITRATE 100 MG PO TABS: 100.0000 mg | ORAL_TABLET | Freq: Every day | ORAL | 12 refills | Status: DC | PRN

## 2019-08-17 MED ORDER — LEVOTHYROXINE SODIUM 112 MCG PO TABS: 112.0000 ug | ORAL_TABLET | Freq: Every day | ORAL | 1 refills | Status: DC

## 2019-08-17 MED ORDER — OXYBUTYNIN CHLORIDE 5 MG PO TABS: 5.0000 mg | ORAL_TABLET | Freq: Three times a day (TID) | ORAL | 1 refills | Status: DC

## 2019-08-17 MED ORDER — PRAVASTATIN SODIUM 10 MG PO TABS: 10.0000 mg | ORAL_TABLET | Freq: Every day | ORAL | 1 refills | Status: DC

## 2019-08-17 MED ORDER — MELOXICAM 7.5 MG PO TABS: 7.5000 mg | ORAL_TABLET | Freq: Every day | ORAL | 1 refills | Status: DC

## 2019-08-17 MED ORDER — METFORMIN HCL 500 MG PO TABS: 500.0000 mg | ORAL_TABLET | Freq: Two times a day (BID) | ORAL | 1 refills | Status: DC

## 2019-08-17 MED ORDER — GLIMEPIRIDE 2 MG PO TABS: ORAL_TABLET | ORAL | 1 refills | Status: DC

## 2019-08-17 NOTE — Progress Notes (Signed)
Date:  08/17/2019   Name:  Daniel Kirby   DOB:  1946/05/05   MRN:  [id number]   Chief Complaint: Diabetes, Hyperlipidemia, Hypertension, Hypothyroidism, and overactive bladder  Diabetes He presents for his follow-up diabetic visit. He has type 2 diabetes mellitus. His disease course has been stable. There are no hypoglycemic associated symptoms. Pertinent negatives for hypoglycemia include no dizziness, headaches, nervousness/anxiousness, sweats or tremors. There are no diabetic associated symptoms. Pertinent negatives for diabetes include no blurred vision, no chest pain, no fatigue, no foot paresthesias, no foot ulcerations, no polydipsia, no polyphagia, no polyuria, no visual change, no weakness and no weight loss. There are no hypoglycemic complications. Symptoms are stable. There are no diabetic complications. Pertinent negatives for diabetic complications include no CVA, PVD or retinopathy. Risk factors for coronary artery disease include diabetes mellitus, dyslipidemia, hypertension and male sex. Current diabetic treatment includes diet and oral agent (dual therapy). He is compliant with treatment most of the time. He is following a generally healthy diet. Meal planning includes avoidance of concentrated sweets and carbohydrate counting. He participates in exercise intermittently. There is no change in his home blood glucose trend. His breakfast blood glucose is taken between 8-9 am. His breakfast blood glucose range is generally 110-130 mg/dl. An ACE inhibitor/angiotensin II receptor blocker is being taken.  Hyperlipidemia This is a chronic problem. The current episode started more than 1 year ago. The problem is controlled. Recent lipid tests were reviewed and are normal. Exacerbating diseases include obesity. He has no history of chronic renal disease, diabetes, hypothyroidism, liver disease or nephrotic syndrome. Factors aggravating his hyperlipidemia include thiazides. Pertinent negatives  include no chest pain, focal sensory loss, focal weakness, leg pain, myalgias or shortness of breath. Current antihyperlipidemic treatment includes statins. The current treatment provides moderate improvement of lipids. There are no compliance problems.  Risk factors for coronary artery disease include diabetes mellitus, dyslipidemia, hypertension and stress.  Hypertension This is a chronic problem. The current episode started more than 1 year ago. The problem is unchanged. The problem is controlled. Pertinent negatives include no anxiety, blurred vision, chest pain, headaches, malaise/fatigue, neck pain, orthopnea, palpitations, peripheral edema, PND, shortness of breath or sweats. Risk factors for coronary artery disease include dyslipidemia, diabetes mellitus and obesity. Past treatments include angiotensin blockers and diuretics. The current treatment provides moderate improvement. There are no compliance problems.  There is no history of angina, kidney disease, CAD/MI, CVA, heart failure, left ventricular hypertrophy, PVD or retinopathy. Identifiable causes of hypertension include a thyroid problem. There is no history of chronic renal disease, a hypertension causing med or renovascular disease.  Thyroid Problem Presents for follow-up visit. Patient reports no anxiety, cold intolerance, constipation, depressed mood, diaphoresis, diarrhea, dry skin, fatigue, hair loss, heat intolerance, hoarse voice, leg swelling, nail problem, palpitations, tremors, visual change, weight gain or weight loss. The symptoms have been stable. His past medical history is significant for hyperlipidemia. There is no history of diabetes or heart failure.    Review of Systems  Constitutional: Negative for chills, diaphoresis, fatigue, fever, malaise/fatigue, weight gain and weight loss.  HENT: Negative for drooling, ear discharge, ear pain, hoarse voice, mouth sores, nosebleeds, postnasal drip and sore throat.   Eyes: Negative  for blurred vision.  Respiratory: Negative for cough, shortness of breath and wheezing.   Cardiovascular: Negative for chest pain, palpitations, orthopnea, leg swelling and PND.  Gastrointestinal: Negative for abdominal pain, blood in stool, constipation, diarrhea and nausea.  Endocrine: Negative for  cold intolerance, heat intolerance, polydipsia, polyphagia and polyuria.  Genitourinary: Negative for dysuria, frequency, hematuria and urgency.  Musculoskeletal: Negative for back pain, myalgias and neck pain.  Skin: Negative for rash.  Allergic/Immunologic: Negative for environmental allergies.  Neurological: Negative for dizziness, tremors, focal weakness, weakness and headaches.  Hematological: Does not bruise/bleed easily.  Psychiatric/Behavioral: Negative for suicidal ideas. The patient is not nervous/anxious.     Patient Active Problem List   Diagnosis Date Noted   Type 2 diabetes mellitus without complication, without long-term current use of insulin ([facility]) 01/18/2018   Chronic low back pain without sciatica 06/08/2017   Essential hypertension 12/08/2016   Adult hypothyroidism 12/08/2016   Arthritis 12/08/2016   Acute anxiety 12/08/2016   Cervical radiculopathy 12/08/2016   Mixed hyperlipidemia 12/08/2016   Special screening for malignant neoplasms, colon    Benign neoplasm of ascending colon    Major depressive disorder with single episode, in partial remission ([facility]) 08/20/2015   Erectile dysfunction 06/04/2015   History of prostate cancer 06/04/2015    No Known Allergies  Past Surgical History:  Procedure Laterality Date   COLONOSCOPY  2006   normal   COLONOSCOPY WITH PROPOFOL N/A 09/06/2015   Procedure: COLONOSCOPY WITH PROPOFOL;  Surgeon: Lucilla Lame, MD;  Location: [facility];  Service: Endoscopy;  Laterality: N/A;   POLYPECTOMY  09/06/2015   Procedure: POLYPECTOMY;  Surgeon: Lucilla Lame, MD;  Location: [facility];  Service:  Endoscopy;;   robotic prostatectomy  2006    Social History   Tobacco Use   Smoking status: Former Smoker    Packs/day: 1.00    Years: 16.00    Pack years: 16.00    Types: Cigars   Smokeless tobacco: Current User    Types: Snuff   Tobacco comment: Quit 2007. Smoking cessationmaterials not required  Substance Use Topics   Alcohol use: Yes    Alcohol/week: 0.0 standard drinks    Comment: occasional, 1-2 drinks per year   Drug use: No     Medication list has been reviewed and updated.  Current Meds  Medication Sig   glimepiride (AMARYL) 2 MG tablet Take 1 tablet by mouth once daily with breakfast   levothyroxine (SYNTHROID, LEVOTHROID) 112 MCG tablet Take 1 tablet (112 mcg total) by mouth daily.   losartan-hydrochlorothiazide (HYZAAR) 50-12.5 MG tablet Take 1 tablet by mouth once daily   meloxicam (MOBIC) 7.5 MG tablet Take 1 tablet (7.5 mg total) by mouth daily.   metFORMIN (GLUCOPHAGE) 500 MG tablet Take 1 tablet (500 mg total) by mouth 2 (two) times daily with a meal.   oxybutynin (DITROPAN) 5 MG tablet Take 1 tablet (5 mg total) by mouth 3 (three) times daily.   pravastatin (PRAVACHOL) 10 MG tablet Take 1 tablet (10 mg total) by mouth daily.   sildenafil (VIAGRA) 100 MG tablet Take 1 tablet (100 mg total) by mouth daily as needed for erectile dysfunction. Take two hours prior to intercourse on an empty stomach    PHQ 2/9 Scores 08/17/2019 09/23/2018 11/10/2017 06/08/2017  PHQ - 2 Score 0 0 1 0  PHQ- 9 Score 0 0 - -    BP Readings from Last 3 Encounters:  08/17/19 130/80  05/22/19 126/77  02/22/19 130/70    Physical Exam Vitals signs and nursing note reviewed.  HENT:     Head: Normocephalic.     Right Ear: Tympanic membrane, ear canal and external ear normal.     Left Ear: Tympanic membrane, ear canal and external  ear normal.     Nose: Nose normal. No congestion or rhinorrhea.     Mouth/Throat:     Mouth: Mucous membranes are moist.  Eyes:      General: No scleral icterus.       Right eye: No discharge.        Left eye: No discharge.     Conjunctiva/sclera: Conjunctivae normal.     Pupils: Pupils are equal, round, and reactive to light.  Neck:     Musculoskeletal: Normal range of motion and neck supple.     Thyroid: No thyromegaly.     Vascular: No JVD.     Trachea: No tracheal deviation.  Cardiovascular:     Rate and Rhythm: Normal rate and regular rhythm.     Heart sounds: Normal heart sounds. No murmur. No friction rub. No gallop.   Pulmonary:     Effort: No respiratory distress.     Breath sounds: Normal breath sounds. No wheezing, rhonchi or rales.  Abdominal:     General: Bowel sounds are normal.     Palpations: Abdomen is soft. There is no mass.     Tenderness: There is no abdominal tenderness. There is no guarding or rebound.  Musculoskeletal: Normal range of motion.        General: No tenderness.  Lymphadenopathy:     Cervical: No cervical adenopathy.  Skin:    General: Skin is warm.     Capillary Refill: Capillary refill takes less than 2 seconds.     Findings: No rash.  Neurological:     Mental Status: He is alert and oriented to person, place, and time.     Cranial Nerves: No cranial nerve deficit.     Deep Tendon Reflexes: Reflexes are normal and symmetric.     Wt Readings from Last 3 Encounters:  08/17/19 219 lb (99.3 kg)  05/22/19 210 lb (95.3 kg)  02/22/19 220 lb (99.8 kg)    BP 130/80    Pulse 84    Ht 5\' 7"  (1.702 m)    Wt 219 lb (99.3 kg)    BMI 34.30 kg/m   Assessment and Plan:  1. Adult hypothyroidism Chronic.  Controlled.  Will check TSH to note current level of control.  If all is well we will continue with levothyroxine 112 mcg daily. - TSH - levothyroxine (SYNTHROID) 112 MCG tablet; Take 1 tablet (112 mcg total) by mouth daily.  Dispense: 90 tablet; Refill: 1  2. Arthritis Chronic.  Episodic.  Continue meloxicam 7.5 mg once a day. - meloxicam (MOBIC) 7.5 MG tablet; Take 1 tablet  (7.5 mg total) by mouth daily.  Dispense: 90 tablet; Refill: 1  3. Essential hypertension Chronic.  Controlled.  Continue losartan hydrochlorothiazide 50-12.5 mg once a day.  Will check renal function panel. - Renal Function Panel - losartan-hydrochlorothiazide (HYZAAR) 50-12.5 MG tablet; Take 1 tablet by mouth daily.  Dispense: 90 tablet; Refill: 1  4. Mixed hyperlipidemia Chronic.  Controlled.  Continue Pravachol 10 mg once a day.  Will check lipid panel. - Lipid Panel With LDL/HDL Ratio - pravastatin (PRAVACHOL) 10 MG tablet; Take 1 tablet (10 mg total) by mouth daily.  Dispense: 90 tablet; Refill: 1  5. Type 2 diabetes mellitus without complication, without long-term current use of insulin (HCC) Chronic.  Controlled.  Continue glimepiride 2 mg 1 a day and metformin 500 mg twice a day.  Will check hemoglobin A1c to note current level of control.  Will recheck in 4 months -  HgB A1c - glimepiride (AMARYL) 2 MG tablet; One a day  Dispense: 90 tablet; Refill: 1 - metFORMIN (GLUCOPHAGE) 500 MG tablet; Take 1 tablet (500 mg total) by mouth 2 (two) times daily with a meal.  Dispense: 180 tablet; Refill: 1  6. Urge incontinence of urine Chronic.  Controlled.  Continue Ditropan 5 mg 1 3 times a day. - oxybutynin (DITROPAN) 5 MG tablet; Take 1 tablet (5 mg total) by mouth 3 (three) times daily.  Dispense: 270 tablet; Refill: 1

## 2019-08-18 DIAGNOSIS — E119 Type 2 diabetes mellitus without complications: Secondary | ICD-10-CM | POA: Diagnosis not present

## 2019-08-18 DIAGNOSIS — E782 Mixed hyperlipidemia: Secondary | ICD-10-CM | POA: Diagnosis not present

## 2019-08-18 DIAGNOSIS — E039 Hypothyroidism, unspecified: Secondary | ICD-10-CM | POA: Diagnosis not present

## 2019-08-18 DIAGNOSIS — I10 Essential (primary) hypertension: Secondary | ICD-10-CM | POA: Diagnosis not present

## 2019-08-19 LAB — RENAL FUNCTION PANEL
Albumin: 4.2 g/dL (ref 3.7–4.7)
BUN/Creatinine Ratio: 15 (ref 10–24)
BUN: 14 mg/dL (ref 8–27)
CO2: 26 mmol/L (ref 20–29)
Calcium: 9.7 mg/dL (ref 8.6–10.2)
Chloride: 99 mmol/L (ref 96–106)
Creatinine, Ser: 0.93 mg/dL (ref 0.76–1.27)
GFR calc Af Amer: 94 mL/min/{1.73_m2} (ref >59)
GFR calc non Af Amer: 81 mL/min/{1.73_m2} (ref >59)
Glucose: 114 mg/dL — ABNORMAL HIGH (ref 65–99)
Phosphorus: 3.8 mg/dL (ref 2.8–4.1)
Potassium: 3.8 mmol/L (ref 3.5–5.2)
Sodium: 140 mmol/L (ref 134–144)

## 2019-08-19 LAB — LIPID PANEL WITH LDL/HDL RATIO
Cholesterol, Total: 140 mg/dL (ref 100–199)
HDL: 33 mg/dL — ABNORMAL LOW (ref >39)
LDL Chol Calc (NIH): 77 mg/dL (ref 0–99)
LDL/HDL Ratio: 2.3 ratio (ref 0.0–3.6)
Triglycerides: 174 mg/dL — ABNORMAL HIGH (ref 0–149)
VLDL Cholesterol Cal: 30 mg/dL (ref 5–40)

## 2019-08-19 LAB — HEMOGLOBIN A1C
Est. average glucose Bld gHb Est-mCnc: 117 mg/dL
Hgb A1c MFr Bld: 5.7 % — ABNORMAL HIGH (ref 4.8–5.6)

## 2019-08-19 LAB — TSH: TSH: 1.87 u[IU]/mL (ref 0.450–4.500)

## 2019-08-21 ENCOUNTER — Telehealth: Payer: Self-pay

## 2019-08-21 NOTE — Telephone Encounter (Signed)
Advised labs are WNL and to I keep up the good work.

## 2019-08-28 DIAGNOSIS — E119 Type 2 diabetes mellitus without complications: Secondary | ICD-10-CM | POA: Diagnosis not present

## 2019-08-28 DIAGNOSIS — H40003 Preglaucoma, unspecified, bilateral: Secondary | ICD-10-CM | POA: Diagnosis not present

## 2019-09-01 ENCOUNTER — Other Ambulatory Visit: Payer: Self-pay

## 2019-12-29 ENCOUNTER — Other Ambulatory Visit: Payer: Self-pay

## 2019-12-29 ENCOUNTER — Ambulatory Visit: Payer: Medicare Other | Attending: Internal Medicine

## 2019-12-29 DIAGNOSIS — Z23 Encounter for immunization: Secondary | ICD-10-CM

## 2019-12-29 NOTE — Progress Notes (Signed)
   Covid-19 Vaccination Clinic  Name:  Daniel Kirby    MRN: [id number] DOB: [birth year]  12/29/2019  [name] was observed post Covid-19 immunization for 15 minutes without incidence. He was provided with Vaccine Information Sheet and instruction to access the V-Safe system.   [name] was instructed to call 911 with any severe reactions post vaccine: Marland Kitchen Difficulty breathing  . Swelling of your face and throat  . A fast heartbeat  . A bad rash all over your body  . Dizziness and weakness    Immunizations Administered    Name Date Dose VIS Date Route   Moderna COVID-19 Vaccine 12/29/2019 10:33 AM 0.5 mL 10/17/2019 Intramuscular   Manufacturer: Moderna   Lot: [id number]   [id number]

## 2020-01-29 ENCOUNTER — Ambulatory Visit: Payer: Medicare Other | Attending: Internal Medicine

## 2020-01-29 DIAGNOSIS — Z23 Encounter for immunization: Secondary | ICD-10-CM

## 2020-01-29 NOTE — Progress Notes (Signed)
   Covid-19 Vaccination Clinic  Name:  RAUNEL UEHARA    MRN: [id number] DOB: 01/05/1946  01/29/2020  Mr. Quillan was observed post Covid-19 immunization for 15 minutes without incident. He was provided with Vaccine Information Sheet and instruction to access the V-Safe system.   Mr. Zervas was instructed to call 911 with any severe reactions post vaccine: Marland Kitchen Difficulty breathing  . Swelling of face and throat  . A fast heartbeat  . A bad rash all over body  . Dizziness and weakness   Immunizations Administered    Name Date Dose VIS Date Route   Moderna COVID-19 Vaccine 01/29/2020  9:55 AM 0.5 mL 10/17/2019 Intramuscular   Manufacturer: Moderna   Lot: [id number]   [place]:[id number]

## 2020-02-26 DIAGNOSIS — H40003 Preglaucoma, unspecified, bilateral: Secondary | ICD-10-CM | POA: Diagnosis not present

## 2020-03-15 ENCOUNTER — Other Ambulatory Visit: Payer: Self-pay | Admitting: Family Medicine

## 2020-03-15 DIAGNOSIS — E039 Hypothyroidism, unspecified: Secondary | ICD-10-CM

## 2020-03-15 DIAGNOSIS — I10 Essential (primary) hypertension: Secondary | ICD-10-CM

## 2020-03-15 DIAGNOSIS — E119 Type 2 diabetes mellitus without complications: Secondary | ICD-10-CM

## 2020-03-15 NOTE — Telephone Encounter (Signed)
Requested medication (s) are due for refill today -yes  Requested medication (s) are on the active medication list -yes  Future visit scheduled -yes- but not with PCP  Last refill: 12/13/19- Hyzaar, 01/25/20 Glimepiride   Notes to clinic: Patient is failing lab protocol- he has appointment at office 5/3- but it is not with PCP.Unable to call to see if labs can be ordered at that visit for RF- lunch break phones off- message sent for review  Requested Prescriptions  Pending Prescriptions Disp Refills   losartan-hydrochlorothiazide (HYZAAR) 50-12.5 MG tablet [Pharmacy Med Name: Losartan Potassium-HCTZ 50-12.5 MG Oral Tablet] 90 tablet 0    Sig: Take 1 tablet by mouth once daily      Cardiovascular: ARB + Diuretic Combos Failed - 03/15/2020 12:50 PM      Failed - K in normal range and within 180 days    Potassium  Date Value Ref Range Status  08/18/2019 3.8 3.5 - 5.2 mmol/L Final          Failed - Na in normal range and within 180 days    Sodium  Date Value Ref Range Status  08/18/2019 140 134 - 144 mmol/L Final          Failed - Cr in normal range and within 180 days    Creatinine, Ser  Date Value Ref Range Status  08/18/2019 0.93 0.76 - 1.27 mg/dL Final          Failed - Ca in normal range and within 180 days    Calcium  Date Value Ref Range Status  08/18/2019 9.7 8.6 - 10.2 mg/dL Final          Failed - Valid encounter within last 6 months    Recent Outpatient Visits           7 months ago Adult hypothyroidism   [facility] Juline Patch, [facility]   1 year ago Tendonitis of shoulder, left   [facility] Juline Patch, MD   1 year ago Type 2 diabetes mellitus without complication, without long-term current use of insulin ([place])   [facility] Juline Patch, MD   1 year ago Acute maxillary sinusitis, recurrence not specified   [facility] Juline Patch, MD   1 year ago Influenza A   [facility] Juline Patch, MD        Future Appointments             In 2 months McGowan, Gordan Payment [place] - Patient is not pregnant      Passed - Last BP in normal range    BP Readings from Last 1 Encounters:  08/17/19 130/80            glimepiride (AMARYL) 2 MG tablet [Pharmacy Med Name: Glimepiride 2 MG Oral Tablet] 30 tablet 0    Sig: Take 1 tablet by mouth once daily with breakfast      Endocrinology:  Diabetes - Sulfonylureas Failed - 03/15/2020 12:50 PM      Failed - HBA1C is between 0 and 7.9 and within 180 days    Hgb A1c MFr Bld  Date Value Ref Range Status  08/18/2019 5.7 (H) 4.8 - 5.6 % Final    Comment:             Prediabetes: 5.7 - 6.4  Diabetes: >6.4          Glycemic control for adults with diabetes: <7.0           Failed - Valid encounter within last 6 months    Recent Outpatient Visits           7 months ago Adult hypothyroidism   [facility] Juline Patch, MD   1 year ago Tendonitis of shoulder, left   [facility] Juline Patch, MD   1 year ago Type 2 diabetes mellitus without complication, without long-term current use of insulin ([place])   [facility] Juline Patch, MD   1 year ago Acute maxillary sinusitis, recurrence not specified   [facility] Juline Patch, MD   1 year ago Influenza [facility] Juline Patch, MD       Future Appointments             In 2 months McGowan, Gordan Payment [facility] [place]             Signed Prescriptions Disp Refills   levothyroxine (SYNTHROID) 112 MCG tablet 90 tablet 0    Sig: Take 1 tablet by mouth once daily      Endocrinology:  Hypothyroid Agents Failed - 03/15/2020 12:50 PM      Failed - TSH needs to be rechecked within 3 months after an abnormal result. Refill until TSH is due.      Passed - TSH in normal range and within 360 days    TSH  Date Value Ref Range Status   08/18/2019 1.870 0.450 - 4.500 uIU/mL Final          Passed - Valid encounter within last 12 months    Recent Outpatient Visits           7 months ago Adult hypothyroidism   [facility] Juline Patch, MD   1 year ago Tendonitis of shoulder, left   [facility] Juline Patch, MD   1 year ago Type 2 diabetes mellitus without complication, without long-term current use of insulin ([place])   [facility] Juline Patch, MD   1 year ago Acute maxillary sinusitis, recurrence not specified   [facility] Juline Patch, MD   1 year ago [facility] Juline Patch, MD       Future Appointments             In 2 months McGowan, Gordan Payment [facility] [place]                Requested Prescriptions  Pending Prescriptions Disp Refills   losartan-hydrochlorothiazide (HYZAAR) 50-12.5 MG tablet [Pharmacy Med Name: Losartan Potassium-HCTZ 50-12.5 MG Oral Tablet] 90 tablet 0    Sig: Take 1 tablet by mouth once daily      Cardiovascular: ARB + Diuretic Combos Failed - 03/15/2020 12:50 PM      Failed - K in normal range and within 180 days    Potassium  Date Value Ref Range Status  08/18/2019 3.8 3.5 - 5.2 mmol/L Final          Failed - Na in normal range and within 180 days    Sodium  Date Value Ref Range Status  08/18/2019 140 134 - 144 mmol/L Final          Failed - Cr in normal range and within  180 days    Creatinine, Ser  Date Value Ref Range Status  08/18/2019 0.93 0.76 - 1.27 mg/dL Final          Failed - Ca in normal range and within 180 days    Calcium  Date Value Ref Range Status  08/18/2019 9.7 8.6 - 10.2 mg/dL Final          Failed - Valid encounter within last 6 months    Recent Outpatient Visits           7 months ago Adult hypothyroidism   [facility] Juline Patch, MD   1 year ago Tendonitis of shoulder, left   [facility] Juline Patch,  MD   1 year ago Type 2 diabetes mellitus without complication, without long-term current use of insulin ([place])   [facility] Juline Patch, MD   1 year ago Acute maxillary sinusitis, recurrence not specified   [facility] Juline Patch, MD   1 year ago Influenza A   [facility] Juline Patch, MD       Future Appointments             In 2 months McGowan, Gordan Payment [place] - Patient is not pregnant      Passed - Last BP in normal range    BP Readings from Last 1 Encounters:  08/17/19 130/80            glimepiride (AMARYL) 2 MG tablet [Pharmacy Med Name: Glimepiride 2 MG Oral Tablet] 30 tablet 0    Sig: Take 1 tablet by mouth once daily with breakfast      Endocrinology:  Diabetes - Sulfonylureas Failed - 03/15/2020 12:50 PM      Failed - HBA1C is between 0 and 7.9 and within 180 days    Hgb A1c MFr Bld  Date Value Ref Range Status  08/18/2019 5.7 (H) 4.8 - 5.6 % Final    Comment:             Prediabetes: 5.7 - 6.4          Diabetes: >6.4          Glycemic control for adults with diabetes: <7.0           Failed - Valid encounter within last 6 months    Recent Outpatient Visits           7 months ago Adult hypothyroidism   [facility] Juline Patch, MD   1 year ago Tendonitis of shoulder, left   [facility] Juline Patch, MD   1 year ago Type 2 diabetes mellitus without complication, without long-term current use of insulin ([place])   [facility] Juline Patch, MD   1 year ago Acute maxillary sinusitis, recurrence not specified   [facility] Juline Patch, MD   1 year ago Influenza A   [facility] Juline Patch, MD       Future Appointments             In 2 months McGowan, Hunt Oris, PA-C [facility] [place]             Signed Prescriptions Disp Refills   levothyroxine (SYNTHROID) 112 MCG  tablet 90 tablet 0    Sig: Take 1 tablet by mouth once daily  Endocrinology:  Hypothyroid Agents Failed - 03/15/2020 12:50 PM      Failed - TSH needs to be rechecked within 3 months after an abnormal result. Refill until TSH is due.      Passed - TSH in normal range and within 360 days    TSH  Date Value Ref Range Status  08/18/2019 1.870 0.450 - 4.500 uIU/mL Final          Passed - Valid encounter within last 12 months    Recent Outpatient Visits           7 months ago Adult hypothyroidism   [facility] Juline Patch, MD   1 year ago Tendonitis of shoulder, left   [facility] Juline Patch, MD   1 year ago Type 2 diabetes mellitus without complication, without long-term current use of insulin ([place])   [facility] Juline Patch, MD   1 year ago Acute maxillary sinusitis, recurrence not specified   [facility] Juline Patch, MD   1 year ago Influenza [facility] Juline Patch, MD       Future Appointments             In 2 months McGowan, Gordan Payment [place]

## 2020-03-18 ENCOUNTER — Ambulatory Visit: Payer: Medicare Other

## 2020-03-18 ENCOUNTER — Ambulatory Visit: Payer: Medicare Other | Admitting: Family Medicine

## 2020-03-20 ENCOUNTER — Other Ambulatory Visit: Payer: Self-pay

## 2020-03-20 ENCOUNTER — Encounter: Payer: Self-pay | Admitting: Family Medicine

## 2020-03-20 ENCOUNTER — Ambulatory Visit (INDEPENDENT_AMBULATORY_CARE_PROVIDER_SITE_OTHER): Payer: Medicare Other | Admitting: Family Medicine

## 2020-03-20 VITALS — BP 138/80 | HR 68 | Ht 67.0 in | Wt 228.0 lb

## 2020-03-20 DIAGNOSIS — M25571 Pain in right ankle and joints of right foot: Secondary | ICD-10-CM

## 2020-03-20 DIAGNOSIS — E039 Hypothyroidism, unspecified: Secondary | ICD-10-CM | POA: Diagnosis not present

## 2020-03-20 DIAGNOSIS — G8929 Other chronic pain: Secondary | ICD-10-CM

## 2020-03-20 DIAGNOSIS — E782 Mixed hyperlipidemia: Secondary | ICD-10-CM

## 2020-03-20 DIAGNOSIS — E119 Type 2 diabetes mellitus without complications: Secondary | ICD-10-CM

## 2020-03-20 DIAGNOSIS — I10 Essential (primary) hypertension: Secondary | ICD-10-CM

## 2020-03-20 MED ORDER — GLIMEPIRIDE 2 MG PO TABS: ORAL_TABLET | ORAL | 1 refills | Status: DC

## 2020-03-20 MED ORDER — LOSARTAN POTASSIUM-HCTZ 50-12.5 MG PO TABS: 1.0000 | ORAL_TABLET | Freq: Every day | ORAL | 1 refills | Status: DC

## 2020-03-20 MED ORDER — LEVOTHYROXINE SODIUM 112 MCG PO TABS: 112.0000 ug | ORAL_TABLET | Freq: Every day | ORAL | 1 refills | Status: DC

## 2020-03-20 MED ORDER — METFORMIN HCL 500 MG PO TABS: 500.0000 mg | ORAL_TABLET | Freq: Two times a day (BID) | ORAL | 1 refills | Status: DC

## 2020-03-20 MED ORDER — PRAVASTATIN SODIUM 10 MG PO TABS: 10.0000 mg | ORAL_TABLET | Freq: Every day | ORAL | 1 refills | Status: DC

## 2020-03-20 NOTE — Progress Notes (Signed)
Date:  03/20/2020   Name:  Daniel Kirby   DOB:  01-Dec-1945   MRN:  [id number]   Chief Complaint: Diabetes (100- 104), Hyperlipidemia, Hypertension, Hypothyroidism, Erectile Dysfunction, overactive bladder, and Ankle Pain (has tried Meloxicam- not working)  Diabetes He presents for his follow-up diabetic visit. He has type 2 diabetes mellitus. His disease course has been stable. There are no hypoglycemic associated symptoms. Pertinent negatives for hypoglycemia include no dizziness, headaches, nervousness/anxiousness or tremors. Pertinent negatives for diabetes include no blurred vision, no chest pain, no fatigue, no foot paresthesias, no foot ulcerations, no polydipsia, no polyphagia, no polyuria, no visual change, no weakness and no weight loss. There are no hypoglycemic complications. Symptoms are stable. There are no diabetic complications. Pertinent negatives for diabetic complications include no CVA, PVD or retinopathy. Risk factors for coronary artery disease include diabetes mellitus, dyslipidemia, hypertension and male sex. Current diabetic treatment includes oral agent (dual therapy). His weight is stable. He is following a generally healthy diet. Meal planning includes avoidance of concentrated sweets and carbohydrate counting. He participates in exercise intermittently. His home blood glucose trend is fluctuating minimally. His breakfast blood glucose range is generally 110-130 mg/dl. An ACE inhibitor/angiotensin II receptor blocker is being taken. He does not see a podiatrist.Eye exam is current.  Hyperlipidemia This is a chronic problem. The current episode started more than 1 year ago. The problem is controlled. Recent lipid tests were reviewed and are normal. He has no history of chronic renal disease, diabetes, hypothyroidism, liver disease, obesity or nephrotic syndrome. Factors aggravating his hyperlipidemia include thiazides. Pertinent negatives include no chest pain, focal sensory  loss, focal weakness, leg pain, myalgias or shortness of breath. Current antihyperlipidemic treatment includes statins. The current treatment provides moderate improvement of lipids. There are no compliance problems.  There are no known risk factors for coronary artery disease.  Hypertension This is a chronic problem. The current episode started more than 1 year ago. The problem has been gradually improving since onset. The problem is controlled. Pertinent negatives include no anxiety, blurred vision, chest pain, headaches, neck pain, palpitations, PND or shortness of breath. There are no associated agents to hypertension. Risk factors for coronary artery disease include dyslipidemia, diabetes mellitus and male gender. Past treatments include angiotensin blockers and diuretics. The current treatment provides moderate improvement. There are no compliance problems.  There is no history of angina, kidney disease, CAD/MI, CVA, heart failure, left ventricular hypertrophy, PVD or retinopathy. Identifiable causes of hypertension include a thyroid problem. There is no history of chronic renal disease.  Erectile Dysfunction This is a chronic problem. The problem has been waxing and waning since onset. The nature of his difficulty is achieving erection and maintaining erection. He reports no anxiety, decreased libido or performance anxiety. Irritative symptoms do not include frequency or urgency. Pertinent negatives include no chills, dysuria, genital pain, hematuria, hesitancy or inability to urinate. Past treatments include injection treatment. The treatment provided moderate relief.  Ankle Pain  The incident occurred more than 1 week ago ("years"). Incident location: AA/1973. The pain is present in the right ankle. The quality of the pain is described as aching. The pain is moderate. The pain has been fluctuating since onset. Pertinent negatives include no inability to bear weight, loss of motion, loss of sensation,  muscle weakness, numbness or tingling. The symptoms are aggravated by movement and weight bearing. He has tried NSAIDs (meloxicam) for the symptoms. The treatment provided moderate relief.  Thyroid Problem Presents for  follow-up visit. Patient reports no anxiety, cold intolerance, constipation, depressed mood, diaphoresis, diarrhea, dry skin, fatigue, hair loss, heat intolerance, hoarse voice, leg swelling, palpitations, tremors, visual change, weight gain or weight loss. The symptoms have been improving. His past medical history is significant for hyperlipidemia. There is no history of diabetes or heart failure.    Lab Results  Component Value Date   CREATININE 0.93 08/18/2019   BUN 14 08/18/2019   NA 140 08/18/2019   K 3.8 08/18/2019   CL 99 08/18/2019   CO2 26 08/18/2019   Lab Results  Component Value Date   CHOL 140 08/18/2019   HDL 33 (L) 08/18/2019   LDLCALC 77 08/18/2019   TRIG 174 (H) 08/18/2019   CHOLHDL 4.2 12/08/2016   Lab Results  Component Value Date   TSH 1.870 08/18/2019   Lab Results  Component Value Date   HGBA1C 5.7 (H) 08/18/2019   Lab Results  Component Value Date   WBC 6.1 12/19/2018   HGB 14.5 12/19/2018   HCT 41.0 12/19/2018   MCV 86.5 12/19/2018   PLT 168 12/19/2018   Lab Results  Component Value Date   ALT 17 07/07/2018   AST 8 07/07/2018   ALKPHOS 66 07/07/2018   BILITOT 0.4 07/07/2018     Review of Systems  Constitutional: Negative for chills, diaphoresis, fatigue, fever, weight gain and weight loss.  HENT: Negative for drooling, ear discharge, ear pain, hoarse voice and sore throat.   Eyes: Negative for blurred vision.  Respiratory: Negative for cough, shortness of breath and wheezing.   Cardiovascular: Negative for chest pain, palpitations, leg swelling and PND.  Gastrointestinal: Negative for abdominal pain, blood in stool, constipation, diarrhea and nausea.  Endocrine: Negative for cold intolerance, heat intolerance, polydipsia,  polyphagia and polyuria.  Genitourinary: Negative for decreased libido, dysuria, frequency, hematuria, hesitancy and urgency.  Musculoskeletal: Negative for back pain, myalgias and neck pain.  Skin: Negative for rash.  Allergic/Immunologic: Negative for environmental allergies.  Neurological: Negative for dizziness, tingling, tremors, focal weakness, weakness, numbness and headaches.  Hematological: Does not bruise/bleed easily.  Psychiatric/Behavioral: Negative for suicidal ideas. The patient is not nervous/anxious.     Patient Active Problem List   Diagnosis Date Noted  . Type 2 diabetes mellitus without complication, without long-term current use of insulin ([facility]) 01/18/2018  . Chronic low back pain without sciatica 06/08/2017  . Essential hypertension 12/08/2016  . Adult hypothyroidism 12/08/2016  . Arthritis 12/08/2016  . Acute anxiety 12/08/2016  . Cervical radiculopathy 12/08/2016  . Mixed hyperlipidemia 12/08/2016  . Special screening for malignant neoplasms, colon   . Benign neoplasm of ascending colon   . Major depressive disorder with single episode, in partial remission ([place]) 08/20/2015  . Erectile dysfunction 06/04/2015  . History of prostate cancer 06/04/2015    No Known Allergies  Past Surgical History:  Procedure Laterality Date  . COLONOSCOPY  2006   normal  . COLONOSCOPY WITH PROPOFOL N/A 09/06/2015   Procedure: COLONOSCOPY WITH PROPOFOL;  Surgeon: Lucilla Lame, MD;  Location: [facility];  Service: Endoscopy;  Laterality: N/A;  . POLYPECTOMY  09/06/2015   Procedure: POLYPECTOMY;  Surgeon: Lucilla Lame, MD;  Location: [facility];  Service: Endoscopy;;  . robotic prostatectomy  2006    Social History   Tobacco Use  . Smoking status: Former Smoker    Packs/day: 1.00    Years: 16.00    Pack years: 16.00    Types: Cigars  . Smokeless tobacco: Current User  Types: Snuff  . Tobacco comment: Quit 2007. Smoking cessationmaterials not  required  Substance Use Topics  . Alcohol use: Yes    Alcohol/week: 0.0 standard drinks    Comment: occasional, 1-2 drinks per year  . Drug use: No     Medication list has been reviewed and updated.  Current Meds  Medication Sig  . glimepiride (AMARYL) 2 MG tablet One a day  . levothyroxine (SYNTHROID) 112 MCG tablet Take 1 tablet by mouth once daily  . losartan-hydrochlorothiazide (HYZAAR) 50-12.5 MG tablet Take 1 tablet by mouth daily.  . meloxicam (MOBIC) 7.5 MG tablet Take 1 tablet (7.5 mg total) by mouth daily.  . metFORMIN (GLUCOPHAGE) 500 MG tablet Take 1 tablet (500 mg total) by mouth 2 (two) times daily with a meal.  . oxybutynin (DITROPAN) 5 MG tablet Take 1 tablet (5 mg total) by mouth 3 (three) times daily.  . pravastatin (PRAVACHOL) 10 MG tablet Take 1 tablet (10 mg total) by mouth daily.  . sildenafil (VIAGRA) 100 MG tablet Take 1 tablet (100 mg total) by mouth daily as needed for erectile dysfunction. Take two hours prior to intercourse on an empty stomach    PHQ 2/9 Scores 03/20/2020 08/17/2019 09/23/2018 11/10/2017  PHQ - 2 Score 0 0 0 1  PHQ- 9 Score 1 0 0 -    BP Readings from Last 3 Encounters:  03/20/20 138/80  08/17/19 130/80  05/22/19 126/77    Physical Exam Vitals and nursing note reviewed.  HENT:     Head: Normocephalic.     Right Ear: External ear normal.     Left Ear: External ear normal.     Nose: Nose normal.  Eyes:     General: No scleral icterus.       Right eye: No discharge.        Left eye: No discharge.     Conjunctiva/sclera: Conjunctivae normal.     Pupils: Pupils are equal, round, and reactive to light.  Neck:     Thyroid: No thyromegaly.     Vascular: No JVD.     Trachea: No tracheal deviation.  Cardiovascular:     Rate and Rhythm: Normal rate and regular rhythm.     Heart sounds: Normal heart sounds. No murmur. No friction rub. No gallop.   Pulmonary:     Effort: No respiratory distress.     Breath sounds: Normal breath  sounds. No wheezing or rales.  Abdominal:     General: Bowel sounds are normal.     Palpations: Abdomen is soft. There is no mass.     Tenderness: There is no abdominal tenderness. There is no guarding or rebound.  Musculoskeletal:        General: Normal range of motion.     Cervical back: Normal range of motion and neck supple.     Right ankle: No deformity. Tenderness present. Normal range of motion. Normal pulse.     Comments: Pain with manipulation  Lymphadenopathy:     Cervical: No cervical adenopathy.  Skin:    General: Skin is warm.     Findings: No rash.  Neurological:     Mental Status: He is alert and oriented to person, place, and time.     Cranial Nerves: No cranial nerve deficit.     Deep Tendon Reflexes: Reflexes are normal and symmetric.     Wt Readings from Last 3 Encounters:  03/20/20 228 lb (103.4 kg)  08/17/19 219 lb (99.3 kg)  05/22/19 210  lb (95.3 kg)    BP 138/80   Pulse 68   Ht 5\' 7"  (1.702 m)   Wt 228 lb (103.4 kg)   BMI 35.71 kg/m   Assessment and Plan:  1. Essential hypertension Chronic.  Controlled.  Stable.  Continue losartan hydrochlorothiazide 50-12.5 mg daily. - losartan-hydrochlorothiazide (HYZAAR) 50-12.5 MG tablet; Take 1 tablet by mouth daily.  Dispense: 90 tablet; Refill: 1  2. Mixed hyperlipidemia Chronic.  Controlled.  Stable.  Patient will continue pravastatin 10 mg once a day.  We will check lipid panel with ratio. - Lipid Panel With LDL/HDL Ratio - pravastatin (PRAVACHOL) 10 MG tablet; Take 1 tablet (10 mg total) by mouth daily.  Dispense: 90 tablet; Refill: 1  3. Type 2 diabetes mellitus without complication, without long-term current use of insulin (HCC) Chronic.  Controlled.  Stable.  Patient will continue glimepiride 2 mg once a day and Metformin 500 mg twice a day.  We will check a microalbuminuria and A1c for assessment of control. - Microalbumin, urine - Hemoglobin A1c - glimepiride (AMARYL) 2 MG tablet; One a day   Dispense: 90 tablet; Refill: 1 - metFORMIN (GLUCOPHAGE) 500 MG tablet; Take 1 tablet (500 mg total) by mouth 2 (two) times daily with a meal.  Dispense: 180 tablet; Refill: 1  4. Adult hypothyroidism                      Chronic.  Controlled.  Stable.  Continue levothyroxine 112 mcg daily.  Will check TSH assessment of current control. - Thyroid Panel With TSH - levothyroxine (SYNTHROID) 112 MCG tablet; Take 1 tablet (112 mcg total) by mouth daily.  Dispense: 90 tablet; Refill: 1  5. Chronic pain of right ankle Chronic./30 years at least.  Intermittent.  Persistent.  Moderate intensity.  Will refer to orthopedic surgery for evaluation of ankle secondary to possible remote injury.  There is some crepitation on range of motion which may suggest arthritis and possible osteophytes.  Given the chronic nature of this we will check a uric acid to rule out the possibility of chronic gout arthritis. - Ambulatory referral to Orthopedic Surgery - Uric acid

## 2020-03-21 LAB — THYROID PANEL WITH TSH
Free Thyroxine Index: 1.4 (ref 1.2–4.9)
T3 Uptake Ratio: 26 % (ref 24–39)
T4, Total: 5.3 ug/dL (ref 4.5–12.0)
TSH: 5.27 u[IU]/mL — ABNORMAL HIGH (ref 0.450–4.500)

## 2020-03-21 LAB — LIPID PANEL WITH LDL/HDL RATIO
Cholesterol, Total: 153 mg/dL (ref 100–199)
HDL: 28 mg/dL — ABNORMAL LOW (ref >39)
LDL Chol Calc (NIH): 87 mg/dL (ref 0–99)
LDL/HDL Ratio: 3.1 ratio (ref 0.0–3.6)
Triglycerides: 221 mg/dL — ABNORMAL HIGH (ref 0–149)
VLDL Cholesterol Cal: 38 mg/dL (ref 5–40)

## 2020-03-21 LAB — HEMOGLOBIN A1C
Est. average glucose Bld gHb Est-mCnc: 117 mg/dL
Hgb A1c MFr Bld: 5.7 % — ABNORMAL HIGH (ref 4.8–5.6)

## 2020-03-21 LAB — MICROALBUMIN, URINE: Microalbumin, Urine: 32.5 ug/mL

## 2020-03-21 LAB — URIC ACID: Uric Acid: 4.4 mg/dL (ref 3.8–8.4)

## 2020-03-22 ENCOUNTER — Other Ambulatory Visit: Payer: Self-pay

## 2020-03-22 DIAGNOSIS — E782 Mixed hyperlipidemia: Secondary | ICD-10-CM

## 2020-03-22 DIAGNOSIS — E039 Hypothyroidism, unspecified: Secondary | ICD-10-CM

## 2020-03-22 MED ORDER — PRAVASTATIN SODIUM 20 MG PO TABS: 20.0000 mg | ORAL_TABLET | Freq: Every day | ORAL | 1 refills | Status: DC

## 2020-03-22 MED ORDER — LEVOTHYROXINE SODIUM 125 MCG PO TABS: 125.0000 ug | ORAL_TABLET | Freq: Every day | ORAL | 1 refills | Status: DC

## 2020-03-22 NOTE — Progress Notes (Unsigned)
Sent in new dose of levothyroxine and pravastatin

## 2020-04-08 DIAGNOSIS — M25571 Pain in right ankle and joints of right foot: Secondary | ICD-10-CM | POA: Diagnosis not present

## 2020-04-08 DIAGNOSIS — M19171 Post-traumatic osteoarthritis, right ankle and foot: Secondary | ICD-10-CM | POA: Diagnosis not present

## 2020-04-08 DIAGNOSIS — E119 Type 2 diabetes mellitus without complications: Secondary | ICD-10-CM | POA: Diagnosis not present

## 2020-04-08 DIAGNOSIS — E669 Obesity, unspecified: Secondary | ICD-10-CM | POA: Diagnosis not present

## 2020-05-27 ENCOUNTER — Ambulatory Visit: Payer: Medicare Other | Admitting: Urology

## 2020-05-31 ENCOUNTER — Other Ambulatory Visit: Payer: Self-pay | Admitting: *Deleted

## 2020-05-31 DIAGNOSIS — Z8546 Personal history of malignant neoplasm of prostate: Secondary | ICD-10-CM

## 2020-06-02 NOTE — Progress Notes (Signed)
06/03/2020 11:47 AM   Daniel Kirby [birth year] [id number]  Referring provider: Juline Patch, MD [address] [place],  [place] [address]  Chief Complaint  Patient presents with  . Follow-up    1 year follow-up, IPSS, PSA    HPI: Daniel Kirby is a 74 year old male with a history of prostate cancer and urge incontinence who presents today for a follow up.    History of prostate cancer Prostate cancer status post radical prostatectomy and adjuvant radiation therapy approximately 10 years ago.  PSA <0.01 ng/mL in 05/2019.    Urge incontinence His I PSS score today is 6/3.  His PVR is 20 mL.  His previous I PSS score was 5/1.  His previous PVR was 20 mL.  His main complaints today are urgency and nocturia.  He is drinking regular Pepsi and Sweet Tea.  Patient denies any gross hematuria, dysuria or suprapubic/flank pain.  Patient denies any fevers, chills, nausea or vomiting.   Patient found  benefit with oxybutynin 5 mg TID.       IPSS    Row Name 06/03/20 1100         International Prostate Symptom Score   How often have you had the sensation of not emptying your bladder? Not at All     How often have you had to urinate less than every two hours? Less than 1 in 5 times     How often have you found you stopped and started again several times when you urinated? Not at All     How often have you found it difficult to postpone urination? About half the time     How often have you had a weak urinary stream? Not at All     How often have you had to strain to start urination? Not at All     How many times did you typically get up at night to urinate? 2 Times     Total IPSS Score 6       Quality of Life due to urinary symptoms   If you were to spend the rest of your life with your urinary condition just the way it is now how would you feel about that? Mixed            Score:  1-7 Mild 8-19 Moderate 20-35 Severe  Erectile dysfunction He has not used the injections  in some time.  PMH: Past Medical History:  Diagnosis Date  . Abdominal pain    RUQ  . Arthritis    right ankle  . ED (erectile dysfunction)   . HLD (hyperlipidemia)   . Hypertension   . Hypothyroid   . Prostate cancer ([place])   . Vertigo    1 episode only, approx 1 month ago    Surgical History: Past Surgical History:  Procedure Laterality Date  . COLONOSCOPY  2006   normal  . COLONOSCOPY WITH PROPOFOL N/A 09/06/2015   Procedure: COLONOSCOPY WITH PROPOFOL;  Surgeon: Lucilla Lame, MD;  Location: [facility];  Service: Endoscopy;  Laterality: N/A;  . POLYPECTOMY  09/06/2015   Procedure: POLYPECTOMY;  Surgeon: Lucilla Lame, MD;  Location: [facility];  Service: Endoscopy;;  . robotic prostatectomy  2006    Home Medications:  Allergies as of 06/03/2020   No Known Allergies     Medication List       Accurate as of June 03, 2020 11:47 AM. If you have any questions, ask your  nurse or doctor.        glimepiride 2 MG tablet Commonly known as: AMARYL One a day   levothyroxine 125 MCG tablet Commonly known as: SYNTHROID Take 1 tablet (125 mcg total) by mouth daily.   losartan-hydrochlorothiazide 50-12.5 MG tablet Commonly known as: HYZAAR Take 1 tablet by mouth daily.   meloxicam 7.5 MG tablet Commonly known as: MOBIC Take 1 tablet (7.5 mg total) by mouth daily.   metFORMIN 500 MG tablet Commonly known as: GLUCOPHAGE Take 1 tablet (500 mg total) by mouth 2 (two) times daily with a meal.   oxybutynin 5 MG tablet Commonly known as: DITROPAN Take 1 tablet (5 mg total) by mouth 3 (three) times daily.   pravastatin 10 MG tablet Commonly known as: PRAVACHOL Take 1 tablet by mouth daily. What changed: Another medication with the same name was removed. Continue taking this medication, and follow the directions you see here. Changed by: Zara Council, PA-C   sildenafil 100 MG tablet Commonly known as: VIAGRA Take 1 tablet (100 mg total) by mouth  daily as needed for erectile dysfunction. Take two hours prior to intercourse on an empty stomach       Allergies: No Known Allergies  Family History: Family History  Problem Relation Age of Onset  . Heart attack Father   . Diabetes Mother   . Kidney disease Brother     Social History:  reports that he has quit smoking. His smoking use included cigars. He has a 16.00 pack-year smoking history. His smokeless tobacco use includes snuff. He reports current alcohol use. He reports that he does not use drugs.  ROS: For pertinent review of systems please refer to history of present illness  Physical Exam: BP 137/85   Pulse 99   Ht 5\' 7"  (1.702 m)   Wt 223 lb (101.2 kg)   BMI 34.93 kg/m   Constitutional:  Well nourished. Alert and oriented, No acute distress. HEENT: [place] AT, mask in place.  Trachea midline Cardiovascular: No clubbing, cyanosis, or edema. Respiratory: Normal respiratory effort, no increased work of breathing. Neurologic: Grossly intact, no focal deficits, moving all 4 extremities. Psychiatric: Normal mood and affect.   Laboratory Data: Urinalysis Component     Latest Ref Rng & Units 06/03/2020  Color, Urine     YELLOW YELLOW  Appearance     CLEAR CLEAR  Specific Gravity, Urine     1.005 - 1.030 1.020  pH     5.0 - 8.0 8.5 (H)  Glucose, UA     NEGATIVE mg/dL NEGATIVE  Hgb urine dipstick     NEGATIVE NEGATIVE  Bilirubin Urine     NEGATIVE NEGATIVE  Ketones, ur     NEGATIVE mg/dL NEGATIVE  Protein     NEGATIVE mg/dL NEGATIVE  Nitrite     NEGATIVE NEGATIVE  Leukocytes,Ua     NEGATIVE NEGATIVE  Squamous Epithelial / LPF     0 - 5 0-5  WBC, UA     0 - 5 WBC/hpf 0-5  RBC / HPF     0 - 5 RBC/hpf NONE SEEN  Bacteria, UA     NONE SEEN NONE SEEN   Lab Results  Component Value Date   WBC 6.1 12/19/2018   HGB 14.5 12/19/2018   HCT 41.0 12/19/2018   MCV 86.5 12/19/2018   PLT 168 12/19/2018    Lab Results  Component Value Date   CREATININE 0.93  08/18/2019    Lab Results  Component Value Date  PSA1 <0.1 04/28/2018   PSA1 <0.1 04/27/2017   PSA1 <0.1 04/29/2016    Lab Results  Component Value Date   HGBA1C 5.7 (H) 03/20/2020   I have reviewed the labs.  Assessment & Plan:    1. Urge incontinence Continue  oxybutynin 5 mg tid BLADDER SCAN AMB NON-IMAGING RTC in one year for I PSS and PVR  2. Erectile dysfunction He would like to try the Trimix with the Lidocaine RTC in one year for exam  3. History of prostate cancer PSA pending  RTC in one year for PSA    Return in about 1 year (around 06/03/2021) for IPSS, PVR, PSA and exam.  Zara Council, [facility]  [place] [address], [place], [place] [address] [phone]

## 2020-06-03 ENCOUNTER — Other Ambulatory Visit
Admission: RE | Admit: 2020-06-03 | Discharge: 2020-06-03 | Disposition: A | Discharge status: Home / Self Care | Payer: Medicare Other | Attending: Urology | Admitting: Urology

## 2020-06-03 ENCOUNTER — Ambulatory Visit (INDEPENDENT_AMBULATORY_CARE_PROVIDER_SITE_OTHER): Payer: Medicare Other | Admitting: Urology

## 2020-06-03 ENCOUNTER — Telehealth: Payer: Self-pay | Admitting: Urology

## 2020-06-03 ENCOUNTER — Encounter: Payer: Self-pay | Admitting: Urology

## 2020-06-03 ENCOUNTER — Other Ambulatory Visit: Payer: Self-pay

## 2020-06-03 VITALS — BP 137/85 | HR 99 | Ht 67.0 in | Wt 223.0 lb

## 2020-06-03 DIAGNOSIS — N3941 Urge incontinence: Secondary | ICD-10-CM | POA: Diagnosis not present

## 2020-06-03 DIAGNOSIS — Z8546 Personal history of malignant neoplasm of prostate: Secondary | ICD-10-CM | POA: Insufficient documentation

## 2020-06-03 DIAGNOSIS — N5231 Erectile dysfunction following radical prostatectomy: Secondary | ICD-10-CM | POA: Insufficient documentation

## 2020-06-03 LAB — URINALYSIS, COMPLETE (UACMP) WITH MICROSCOPIC
Bacteria, UA: NONE SEEN
Bilirubin Urine: NEGATIVE
Glucose, UA: NEGATIVE mg/dL
Hgb urine dipstick: NEGATIVE
Ketones, ur: NEGATIVE mg/dL
Leukocytes,Ua: NEGATIVE
Nitrite: NEGATIVE
Protein, ur: NEGATIVE mg/dL
RBC / HPF: NONE SEEN RBC/hpf (ref 0–5)
Specific Gravity, Urine: 1.02 (ref 1.005–1.030)
pH: 8.5 — ABNORMAL HIGH (ref 5.0–8.0)

## 2020-06-03 LAB — PSA: Prostatic Specific Antigen: <0.01 ng/mL (ref 0.00–4.00)

## 2020-06-03 MED ORDER — NONFORMULARY OR COMPOUNDED ITEM: 6 refills | Status: DC

## 2020-06-03 MED ORDER — OXYBUTYNIN CHLORIDE 5 MG PO TABS: 5.0000 mg | ORAL_TABLET | Freq: Three times a day (TID) | ORAL | 1 refills | Status: DC

## 2020-06-03 NOTE — Telephone Encounter (Signed)
rx sent to pharmacy via fax

## 2020-06-03 NOTE — Telephone Encounter (Signed)
Would you call in a Trimix prescription to [facility]?  He injects 5 mcg at a time.

## 2020-07-10 ENCOUNTER — Encounter: Payer: Self-pay | Admitting: Family Medicine

## 2020-07-10 ENCOUNTER — Other Ambulatory Visit: Payer: Self-pay

## 2020-07-10 ENCOUNTER — Ambulatory Visit (INDEPENDENT_AMBULATORY_CARE_PROVIDER_SITE_OTHER): Payer: Medicare Other | Admitting: Family Medicine

## 2020-07-10 VITALS — BP 130/62 | HR 80 | Ht 67.0 in | Wt 222.0 lb

## 2020-07-10 DIAGNOSIS — E119 Type 2 diabetes mellitus without complications: Secondary | ICD-10-CM

## 2020-07-10 DIAGNOSIS — E039 Hypothyroidism, unspecified: Secondary | ICD-10-CM | POA: Diagnosis not present

## 2020-07-10 MED ORDER — METFORMIN HCL 500 MG PO TABS: 500.0000 mg | ORAL_TABLET | Freq: Two times a day (BID) | ORAL | 1 refills | Status: DC

## 2020-07-10 MED ORDER — LEVOTHYROXINE SODIUM 125 MCG PO TABS: 125.0000 ug | ORAL_TABLET | Freq: Every day | ORAL | 1 refills | Status: DC

## 2020-07-10 MED ORDER — GLIMEPIRIDE 2 MG PO TABS: ORAL_TABLET | ORAL | 1 refills | Status: DC

## 2020-07-10 NOTE — Progress Notes (Signed)
Date:  07/10/2020   Name:  Daniel Kirby   DOB:  02-16-1946   MRN:  [id number]   Chief Complaint: Hypothyroidism and Diabetes (needs foot exam)  Diabetes He presents for his follow-up diabetic visit. He has type 2 diabetes mellitus. His disease course has been stable. There are no hypoglycemic associated symptoms. Pertinent negatives for hypoglycemia include no dizziness, headaches, nervousness/anxiousness or tremors. There are no diabetic associated symptoms. Pertinent negatives for diabetes include no blurred vision, no chest pain, no fatigue, no foot paresthesias, no foot ulcerations, no polydipsia, no polyphagia, no polyuria, no visual change, no weakness and no weight loss. There are no hypoglycemic complications. Symptoms are stable. There are no diabetic complications. Risk factors for coronary artery disease include dyslipidemia, diabetes mellitus and male sex. Current diabetic treatment includes oral agent (dual therapy). He is compliant with treatment most of the time. He is following a generally healthy diet. Meal planning includes avoidance of concentrated sweets and carbohydrate counting. He participates in exercise intermittently. There is no change in his home blood glucose trend. An ACE inhibitor/angiotensin II receptor blocker is being taken. He does not see a podiatrist.Eye exam is current.  Thyroid Problem Presents for follow-up visit. Patient reports no anxiety, cold intolerance, constipation, depressed mood, diaphoresis, diarrhea, dry skin, fatigue, hair loss, heat intolerance, hoarse voice, leg swelling, menstrual problem, nail problem, palpitations, tremors, visual change, weight gain or weight loss. The symptoms have been stable.    Lab Results  Component Value Date   CREATININE 0.93 08/18/2019   BUN 14 08/18/2019   NA 140 08/18/2019   K 3.8 08/18/2019   CL 99 08/18/2019   CO2 26 08/18/2019   Lab Results  Component Value Date   CHOL 153 03/20/2020   HDL 28 (L)  03/20/2020   LDLCALC 87 03/20/2020   TRIG 221 (H) 03/20/2020   CHOLHDL 4.2 12/08/2016   Lab Results  Component Value Date   TSH 5.270 (H) 03/20/2020   Lab Results  Component Value Date   HGBA1C 5.7 (H) 03/20/2020   Lab Results  Component Value Date   WBC 6.1 12/19/2018   HGB 14.5 12/19/2018   HCT 41.0 12/19/2018   MCV 86.5 12/19/2018   PLT 168 12/19/2018   Lab Results  Component Value Date   ALT 17 07/07/2018   AST 8 07/07/2018   ALKPHOS 66 07/07/2018   BILITOT 0.4 07/07/2018     Review of Systems  Constitutional: Negative for chills, diaphoresis, fatigue, fever, weight gain and weight loss.  HENT: Negative for drooling, ear discharge, ear pain, hoarse voice and sore throat.   Eyes: Negative for blurred vision.  Respiratory: Negative for cough, shortness of breath and wheezing.   Cardiovascular: Negative for chest pain, palpitations and leg swelling.  Gastrointestinal: Negative for abdominal pain, blood in stool, constipation, diarrhea and nausea.  Endocrine: Negative for cold intolerance, heat intolerance, polydipsia, polyphagia and polyuria.  Genitourinary: Negative for dysuria, frequency, hematuria, menstrual problem and urgency.  Musculoskeletal: Negative for back pain, myalgias and neck pain.  Skin: Negative for rash.  Allergic/Immunologic: Negative for environmental allergies.  Neurological: Negative for dizziness, tremors, weakness and headaches.  Hematological: Does not bruise/bleed easily.  Psychiatric/Behavioral: Negative for suicidal ideas. The patient is not nervous/anxious.     Patient Active Problem List   Diagnosis Date Noted  . Type 2 diabetes mellitus without complication, without long-term current use of insulin ([place]) 01/18/2018  . Chronic low back pain without sciatica 06/08/2017  . Essential  hypertension 12/08/2016  . Adult hypothyroidism 12/08/2016  . Arthritis 12/08/2016  . Acute anxiety 12/08/2016  . Cervical radiculopathy 12/08/2016  .  Mixed hyperlipidemia 12/08/2016  . Special screening for malignant neoplasms, colon   . Benign neoplasm of ascending colon   . Major depressive disorder with single episode, in partial remission ([place]) 08/20/2015  . Erectile dysfunction 06/04/2015  . History of prostate cancer 06/04/2015    No Known Allergies  Past Surgical History:  Procedure Laterality Date  . COLONOSCOPY  2006   normal  . COLONOSCOPY WITH PROPOFOL N/A 09/06/2015   Procedure: COLONOSCOPY WITH PROPOFOL;  Surgeon: Lucilla Lame, MD;  Location: [facility];  Service: Endoscopy;  Laterality: N/A;  . POLYPECTOMY  09/06/2015   Procedure: POLYPECTOMY;  Surgeon: Lucilla Lame, MD;  Location: [facility];  Service: Endoscopy;;  . robotic prostatectomy  2006    Social History   Tobacco Use  . Smoking status: Former Smoker    Packs/day: 1.00    Years: 16.00    Pack years: 16.00    Types: Cigars  . Smokeless tobacco: Current User    Types: Snuff  . Tobacco comment: Quit 2007. Smoking cessationmaterials not required  Vaping Use  . Vaping Use: Never used  Substance Use Topics  . Alcohol use: Yes    Alcohol/week: 0.0 standard drinks    Comment: occasional, 1-2 drinks per year  . Drug use: No     Medication list has been reviewed and updated.  No outpatient medications have been marked as taking for the 07/10/20 encounter (Office Visit) with Juline Patch, MD.    [facility] 2/9 Scores 07/10/2020 03/20/2020 08/17/2019 09/23/2018  PHQ - 2 Score 0 0 0 0  PHQ- 9 Score 2 1 0 0    GAD 7 : Generalized Anxiety Score 07/10/2020 03/20/2020  Nervous, Anxious, on Edge 0 0  Control/stop worrying 0 0  Worry too much - different things 0 0  Trouble relaxing 0 0  Restless 0 0  Easily annoyed or irritable 0 0  Afraid - awful might happen 0 0  Total GAD 7 Score 0 0    BP Readings from Last 3 Encounters:  07/10/20 130/62  06/03/20 137/85  03/20/20 138/80    Physical Exam Vitals and nursing note reviewed.  HENT:      Head: Normocephalic.     Right Ear: Tympanic membrane, ear canal and external ear normal. There is no impacted cerumen.     Left Ear: Tympanic membrane, ear canal and external ear normal. There is no impacted cerumen.     Nose: Nose normal. No congestion or rhinorrhea.     Mouth/Throat:     Mouth: Mucous membranes are dry.     Pharynx: Oropharynx is clear.  Eyes:     General: No scleral icterus.       Right eye: No discharge.        Left eye: No discharge.     Conjunctiva/sclera: Conjunctivae normal.     Pupils: Pupils are equal, round, and reactive to light.  Neck:     Thyroid: No thyromegaly.     Vascular: No JVD.     Trachea: No tracheal deviation.  Cardiovascular:     Rate and Rhythm: Normal rate and regular rhythm.     Pulses:          Dorsalis pedis pulses are 2+ on the right side and 2+ on the left side.       Posterior tibial pulses  are 2+ on the right side and 2+ on the left side.     Heart sounds: Normal heart sounds. No murmur heard.  No friction rub. No gallop.   Pulmonary:     Effort: No respiratory distress.     Breath sounds: Normal breath sounds. No wheezing, rhonchi or rales.  Abdominal:     General: Bowel sounds are normal. There is no distension.     Palpations: Abdomen is soft. There is no mass.     Tenderness: There is no abdominal tenderness. There is no right CVA tenderness, left CVA tenderness, guarding or rebound.     Hernia: No hernia is present.  Musculoskeletal:        General: No tenderness. Normal range of motion.     Cervical back: Normal range of motion and neck supple.     Right foot: Normal range of motion.     Left foot: Normal range of motion.  Feet:     Right foot:     Protective Sensation: 10 sites tested. 10 sites sensed.     Skin integrity: Skin integrity normal.     Left foot:     Protective Sensation: 10 sites tested. 10 sites sensed.     Skin integrity: Skin integrity normal.  Lymphadenopathy:     Cervical: No cervical  adenopathy.  Skin:    General: Skin is warm.     Findings: No rash.  Neurological:     Mental Status: He is alert and oriented to person, place, and time.     Cranial Nerves: No cranial nerve deficit.     Deep Tendon Reflexes: Reflexes are normal and symmetric.     Wt Readings from Last 3 Encounters:  07/10/20 222 lb (100.7 kg)  06/03/20 223 lb (101.2 kg)  03/20/20 228 lb (103.4 kg)    BP 130/62   Pulse 80   Ht 5\' 7"  (1.702 m)   Wt 222 lb (100.7 kg)   BMI 34.77 kg/m   Assessment and Plan: 1. Adult hypothyroidism Panic.  Controlled.  Stable.  Will check TSH and according to results adjust level of thyroxine accordingly. - TSH - levothyroxine (SYNTHROID) 125 MCG tablet; Take 1 tablet (125 mcg total) by mouth daily.  Dispense: 30 tablet; Refill: 1  2. Type 2 diabetes mellitus without complication, without long-term current use of insulin (HCC) Chronic.  Controlled.  Stable.  Continue glimepiride 2 mg once a day and Metformin 500 mg twice a day.  Will check A1c to assess level of control. - HgB A1c - glimepiride (AMARYL) 2 MG tablet; One a day  Dispense: 90 tablet; Refill: 1 - metFORMIN (GLUCOPHAGE) 500 MG tablet; Take 1 tablet (500 mg total) by mouth 2 (two) times daily with a meal.  Dispense: 180 tablet; Refill: 1

## 2020-07-12 DIAGNOSIS — E119 Type 2 diabetes mellitus without complications: Secondary | ICD-10-CM | POA: Diagnosis not present

## 2020-07-12 DIAGNOSIS — E039 Hypothyroidism, unspecified: Secondary | ICD-10-CM | POA: Diagnosis not present

## 2020-07-13 LAB — TSH: TSH: 1.15 u[IU]/mL (ref 0.450–4.500)

## 2020-07-13 LAB — HEMOGLOBIN A1C
Est. average glucose Bld gHb Est-mCnc: 123 mg/dL
Hgb A1c MFr Bld: 5.9 % — ABNORMAL HIGH (ref 4.8–5.6)

## 2020-08-26 DIAGNOSIS — H40003 Preglaucoma, unspecified, bilateral: Secondary | ICD-10-CM | POA: Diagnosis not present

## 2020-09-15 ENCOUNTER — Other Ambulatory Visit: Payer: Self-pay | Admitting: Family Medicine

## 2020-09-15 DIAGNOSIS — I10 Essential (primary) hypertension: Secondary | ICD-10-CM

## 2020-09-15 DIAGNOSIS — M1990 Unspecified osteoarthritis, unspecified site: Secondary | ICD-10-CM

## 2020-09-15 NOTE — Telephone Encounter (Signed)
Requested Prescriptions  Pending Prescriptions Disp Refills  . losartan-hydrochlorothiazide (HYZAAR) 50-12.5 MG tablet [Pharmacy Med Name: Losartan Potassium-HCTZ 50-12.5 MG Oral Tablet] 90 tablet 0    Sig: Take 1 tablet by mouth once daily     Cardiovascular: ARB + Diuretic Combos Failed - 09/15/2020  1:04 PM      Failed - K in normal range and within 180 days    Potassium  Date Value Ref Range Status  08/18/2019 3.8 3.5 - 5.2 mmol/L Final         Failed - Na in normal range and within 180 days    Sodium  Date Value Ref Range Status  08/18/2019 140 134 - 144 mmol/L Final         Failed - Cr in normal range and within 180 days    Creatinine, Ser  Date Value Ref Range Status  08/18/2019 0.93 0.76 - 1.27 mg/dL Final         Failed - Ca in normal range and within 180 days    Calcium  Date Value Ref Range Status  08/18/2019 9.7 8.6 - 10.2 mg/dL Final         Passed - Patient is not pregnant      Passed - Last BP in normal range    BP Readings from Last 1 Encounters:  07/10/20 130/62         Passed - Valid encounter within last 6 months    Recent Outpatient Visits          2 months ago Type 2 diabetes mellitus without complication, without long-term current use of insulin ([place])   [facility] Juline Patch, MD   5 months ago Type 2 diabetes mellitus without complication, without long-term current use of insulin ([place])   [facility] Juline Patch, MD   1 year ago Adult hypothyroidism   [facility] Juline Patch, MD   1 year ago Tendonitis of shoulder, left   [facility] Juline Patch, MD   1 year ago Type 2 diabetes mellitus without complication, without long-term current use of insulin ([place])   [facility] Juline Patch, MD      Future Appointments            In 1 month Juline Patch, MD [facility], [place]   In 8 months [facility], Gordan Payment [facility]           .  meloxicam (MOBIC) 7.5 MG tablet [Pharmacy Med Name: Meloxicam 7.5 MG Oral Tablet] 90 tablet 0    Sig: Take 1 tablet by mouth once daily     Analgesics:  COX2 Inhibitors Failed - 09/15/2020  1:04 PM      Failed - HGB in normal range and within 360 days    Hemoglobin  Date Value Ref Range Status  12/19/2018 14.5 13.0 - 17.0 g/dL Final         Failed - Cr in normal range and within 360 days    Creatinine, Ser  Date Value Ref Range Status  08/18/2019 0.93 0.76 - 1.27 mg/dL Final         Passed - Patient is not pregnant      Passed - Valid encounter within last 12 months    Recent Outpatient Visits          2 months ago Type 2 diabetes mellitus without complication, without long-term current use of insulin ([place])  [facility] Juline Patch, MD   5 months ago Type 2 diabetes mellitus without complication, without long-term current use of insulin ([place])   [facility] Juline Patch, MD   1 year ago Adult hypothyroidism   [facility] Juline Patch, MD   1 year ago Tendonitis of shoulder, left   [facility] Juline Patch, MD   1 year ago Type 2 diabetes mellitus without complication, without long-term current use of insulin [facility])   [facility] Juline Patch, MD      Future Appointments            In 1 month Juline Patch, MD [facility], [place]   In 8 months McGowan, Gordan Payment [place]

## 2020-09-16 LAB — HM DIABETES EYE EXAM

## 2020-10-08 DIAGNOSIS — H40003 Preglaucoma, unspecified, bilateral: Secondary | ICD-10-CM | POA: Diagnosis not present

## 2020-10-21 ENCOUNTER — Other Ambulatory Visit: Payer: Self-pay

## 2020-11-04 ENCOUNTER — Ambulatory Visit (INDEPENDENT_AMBULATORY_CARE_PROVIDER_SITE_OTHER): Payer: Medicare PPO

## 2020-11-04 DIAGNOSIS — Z0000 Encounter for general adult medical examination without abnormal findings: Secondary | ICD-10-CM | POA: Diagnosis not present

## 2020-11-04 NOTE — Patient Instructions (Signed)
[name] , Thank you for taking time to come for your Medicare Wellness Visit. I appreciate your ongoing commitment to your health goals. Please review the following plan we discussed and let me know if I can assist you in the future.   Screening recommendations/referrals: Colonoscopy: done 09/06/15 Recommended yearly ophthalmology/optometry visit for glaucoma screening and checkup Recommended yearly dental visit for hygiene and checkup  Vaccinations: Influenza vaccine: declined  Pneumococcal vaccine: done 08/20/15 Tdap vaccine: done 09/25/13 Shingles vaccine: Shingrix discussed. Please contact your pharmacy for coverage information.  Covid-19:  Done 12/29/19 & 01/29/20  Advanced directives: Advance directive discussed with you today. I have provided a copy for you to complete at home and have notarized. Once this is complete please bring a copy in to our office so we can scan it into your chart.  Conditions/risks identified: Recommend increasing physical activity   Next appointment: Follow up in one year for your annual wellness visit.   Preventive Care 34 Years and Older, Male Preventive care refers to lifestyle choices and visits with your health care provider that can promote health and wellness. What does preventive care include?  A yearly physical exam. This is also called an annual well check.  Dental exams once or twice a year.  Routine eye exams. Ask your health care provider how often you should have your eyes checked.  Personal lifestyle choices, including:  Daily care of your teeth and gums.  Regular physical activity.  Eating a healthy diet.  Avoiding tobacco and drug use.  Limiting alcohol use.  Practicing safe sex.  Taking low doses of aspirin every day.  Taking vitamin and mineral supplements as recommended by your health care provider. What happens during an annual well check? The services and screenings done by your health care provider during your  annual well check will depend on your age, overall health, lifestyle risk factors, and family history of disease. Counseling  Your health care provider may ask you questions about your:  Alcohol use.  Tobacco use.  Drug use.  Emotional well-being.  Home and relationship well-being.  Sexual activity.  Eating habits.  History of falls.  Memory and ability to understand (cognition).  Work and work Statistician. Screening  You may have the following tests or measurements:  Height, weight, and BMI.  Blood pressure.  Lipid and cholesterol levels. These may be checked every 5 years, or more frequently if you are over 74 years old.  Skin check.  Lung cancer screening. You may have this screening every year starting at age 74 if you have a 30-pack-year history of smoking and currently smoke or have quit within the past 15 years.  Fecal occult blood test (FOBT) of the stool. You may have this test every year starting at age 74.  Flexible sigmoidoscopy or colonoscopy. You may have a sigmoidoscopy every 5 years or a colonoscopy every 10 years starting at age 74.  Prostate cancer screening. Recommendations will vary depending on your family history and other risks.  Hepatitis C blood test.  Hepatitis B blood test.  Sexually transmitted disease (STD) testing.  Diabetes screening. This is done by checking your blood sugar (glucose) after you have not eaten for a while (fasting). You may have this done every 1-3 years.  Abdominal aortic aneurysm (AAA) screening. You may need this if you are a current or former smoker.  Osteoporosis. You may be screened starting at age 74 if you are at high risk. Talk with your health care provider  about your test results, treatment options, and if necessary, the need for more tests. Vaccines  Your health care provider may recommend certain vaccines, such as:  Influenza vaccine. This is recommended every year.  Tetanus, diphtheria, and  acellular pertussis (Tdap, Td) vaccine. You may need a Td booster every 10 years.  Zoster vaccine. You may need this after age 26.  Pneumococcal 13-valent conjugate (PCV13) vaccine. One dose is recommended after age 39.  Pneumococcal polysaccharide (PPSV23) vaccine. One dose is recommended after age 74. Talk to your health care provider about which screenings and vaccines you need and how often you need them. This information is not intended to replace advice given to you by your health care provider. Make sure you discuss any questions you have with your health care provider. Document Released: 11/29/2015 Document Revised: 07/22/2016 Document Reviewed: 09/03/2015 Elsevier Interactive Patient Education  2017 [place] Prevention in the Home Falls can cause injuries. They can happen to people of all ages. There are many things you can do to make your home safe and to help prevent falls. What can I do on the outside of my home?  Regularly fix the edges of walkways and driveways and fix any cracks.  Remove anything that might make you trip as you walk through a door, such as a raised step or threshold.  Trim any bushes or trees on the path to your home.  Use bright outdoor lighting.  Clear any walking paths of anything that might make someone trip, such as rocks or tools.  Regularly check to see if handrails are loose or broken. Make sure that both sides of any steps have handrails.  Any raised decks and porches should have guardrails on the edges.  Have any leaves, snow, or ice cleared regularly.  Use sand or salt on walking paths during winter.  Clean up any spills in your garage right away. This includes oil or grease spills. What can I do in the bathroom?  Use night lights.  Install grab bars by the toilet and in the tub and shower. Do not use towel bars as grab bars.  Use non-skid mats or decals in the tub or shower.  If you need to sit down in the shower, use  a plastic, non-slip stool.  Keep the floor dry. Clean up any water that spills on the floor as soon as it happens.  Remove soap buildup in the tub or shower regularly.  Attach bath mats securely with double-sided non-slip rug tape.  Do not have throw rugs and other things on the floor that can make you trip. What can I do in the bedroom?  Use night lights.  Make sure that you have a light by your bed that is easy to reach.  Do not use any sheets or blankets that are too big for your bed. They should not hang down onto the floor.  Have a firm chair that has side arms. You can use this for support while you get dressed.  Do not have throw rugs and other things on the floor that can make you trip. What can I do in the kitchen?  Clean up any spills right away.  Avoid walking on wet floors.  Keep items that you use a lot in easy-to-reach places.  If you need to reach something above you, use a strong step stool that has a grab bar.  Keep electrical cords out of the way.  Do not use floor polish or  wax that makes floors slippery. If you must use wax, use non-skid floor wax.  Do not have throw rugs and other things on the floor that can make you trip. What can I do with my stairs?  Do not leave any items on the stairs.  Make sure that there are handrails on both sides of the stairs and use them. Fix handrails that are broken or loose. Make sure that handrails are as long as the stairways.  Check any carpeting to make sure that it is firmly attached to the stairs. Fix any carpet that is loose or worn.  Avoid having throw rugs at the top or bottom of the stairs. If you do have throw rugs, attach them to the floor with carpet tape.  Make sure that you have a light switch at the top of the stairs and the bottom of the stairs. If you do not have them, ask someone to add them for you. What else can I do to help prevent falls?  Wear shoes that:  Do not have high heels.  Have  rubber bottoms.  Are comfortable and fit you well.  Are closed at the toe. Do not wear sandals.  If you use a stepladder:  Make sure that it is fully opened. Do not climb a closed stepladder.  Make sure that both sides of the stepladder are locked into place.  Ask someone to hold it for you, if possible.  Clearly mark and make sure that you can see:  Any grab bars or handrails.  First and last steps.  Where the edge of each step is.  Use tools that help you move around (mobility aids) if they are needed. These include:  Canes.  Walkers.  Scooters.  Crutches.  Turn on the lights when you go into a dark area. Replace any light bulbs as soon as they burn out.  Set up your furniture so you have a clear path. Avoid moving your furniture around.  If any of your floors are uneven, fix them.  If there are any pets around you, be aware of where they are.  Review your medicines with your doctor. Some medicines can make you feel dizzy. This can increase your chance of falling. Ask your doctor what other things that you can do to help prevent falls. This information is not intended to replace advice given to you by your health care provider. Make sure you discuss any questions you have with your health care provider. Document Released: 08/29/2009 Document Revised: 04/09/2016 Document Reviewed: 12/07/2014 Elsevier Interactive Patient Education  2017 [employer].

## 2020-11-04 NOTE — Progress Notes (Signed)
Subjective:   Daniel Kirby is a 74 y.o. male who presents for an Initial Medicare Annual Wellness Visit.  Virtual Visit via Telephone Note  I connected with  Daniel Kirby on 11/04/20 at  1:20 PM EST by telephone and verified that I am speaking with the correct person using two identifiers.  Location: Patient: home Provider: [facility] Persons participating in the virtual visit: [place]   I discussed the limitations, risks, security and privacy concerns of performing an evaluation and management service by telephone and the availability of in person appointments. The patient expressed understanding and agreed to proceed.  Interactive audio and video telecommunications were attempted between this nurse and patient, however failed, due to patient having technical difficulties OR patient did not have access to video capability.  We continued and completed visit with audio only.  Some vital signs may be absent or patient reported.   [name], LPN    Review of Systems     Cardiac Risk Factors include: advanced age (>65men, >92 women);diabetes mellitus;dyslipidemia;hypertension;male gender;obesity (BMI >30kg/m2);sedentary lifestyle     Objective:    There were no vitals filed for this visit. There is no height or weight on file to calculate BMI.  Advanced Directives 11/04/2020 11/10/2017 09/06/2015 07/24/2015  Does Patient Have a Medical Advance Directive? No No No No  Would patient like information on creating a medical advance directive? Yes (MAU/Ambulatory/Procedural Areas - Information given) Yes (MAU/Ambulatory/Procedural Areas - Information given) No - patient declined information No - patient declined information    Current Medications (verified) Outpatient Encounter Medications as of 11/04/2020  Medication Sig  . glimepiride (AMARYL) 2 MG tablet One a day  . levothyroxine (SYNTHROID) 125 MCG tablet Take 1 tablet (125 mcg total) by mouth daily.  Marland Kitchen  losartan-hydrochlorothiazide (HYZAAR) 50-12.5 MG tablet Take 1 tablet by mouth once daily  . meloxicam (MOBIC) 7.5 MG tablet Take 1 tablet by mouth once daily  . metFORMIN (GLUCOPHAGE) 500 MG tablet Take 1 tablet (500 mg total) by mouth 2 (two) times daily with a meal.  . NONFORMULARY OR COMPOUNDED ITEM Trimix (30/1/10)-(Pap/Phent/PGE)  Dosage: Inject 0.5 cc per injection   Vial 50ml  Qty #10 Refills [place] [phone] Fax [phone]  . pravastatin (PRAVACHOL) 10 MG tablet Take 1 tablet by mouth daily.  . sildenafil (VIAGRA) 100 MG tablet Take 1 tablet (100 mg total) by mouth daily as needed for erectile dysfunction. Take two hours prior to intercourse on an empty stomach  . [DISCONTINUED] oxybutynin (DITROPAN) 5 MG tablet Take 1 tablet (5 mg total) by mouth 3 (three) times daily.   No facility-administered encounter medications on file as of 11/04/2020.    Allergies (verified) Patient has no known allergies.   History: Past Medical History:  Diagnosis Date  . Abdominal pain    RUQ  . Arthritis    right ankle  . ED (erectile dysfunction)   . HLD (hyperlipidemia)   . Hypertension   . Hypothyroid   . Prostate cancer ([place])   . Vertigo    1 episode only, approx 1 month ago   Past Surgical History:  Procedure Laterality Date  . COLONOSCOPY  2006   normal  . COLONOSCOPY WITH PROPOFOL N/A 09/06/2015   Procedure: COLONOSCOPY WITH PROPOFOL;  Surgeon: Lucilla Lame, MD;  Location: [facility];  Service: Endoscopy;  Laterality: N/A;  . POLYPECTOMY  09/06/2015   Procedure: POLYPECTOMY;  Surgeon: Lucilla Lame, MD;  Location: [place];  Service: Endoscopy;;  . PROSTATE SURGERY    . robotic prostatectomy  2006   Family History  Problem Relation Age of Onset  . Heart attack Father   . Diabetes Mother   . Kidney disease Brother    Social History   Socioeconomic History  . Marital status: Legally Separated    Spouse name: Not on file  .  Number of children: 3  . Years of education: Not on file  . Highest education level: 12th grade  Occupational History  . Occupation: Retired  Tobacco Use  . Smoking status: Former Smoker    Packs/day: 1.00    Years: 16.00    Pack years: 16.00    Types: Cigars  . Smokeless tobacco: Current User    Types: Snuff  . Tobacco comment: Quit 2007. Smoking cessationmaterials not required  Vaping Use  . Vaping Use: Never used  Substance and Sexual Activity  . Alcohol use: Not Currently    Alcohol/week: 0.0 standard drinks    Comment: occasional, 1-2 drinks per year  . Drug use: No  . Sexual activity: Yes  Other Topics Concern  . Not on file  Social History Narrative   Lives alone.    Social Determinants of Health   Financial Resource Strain: Low Risk   . Difficulty of Paying Living Expenses: Not hard at all  Food Insecurity: No Food Insecurity  . Worried About Charity fundraiser in the Last Year: Never true  . Ran Out of Food in the Last Year: Never true  Transportation Needs: No Transportation Needs  . Lack of Transportation (Medical): No  . Lack of Transportation (Non-Medical): No  Physical Activity: Inactive  . Days of Exercise per Week: 0 days  . Minutes of Exercise per Session: 0 min  Stress: No Stress Concern Present  . Feeling of Stress : Not at all  Social Connections: Moderately Isolated  . Frequency of Communication with Friends and Family: More than three times a week  . Frequency of Social Gatherings with Friends and Family: Once a week  . Attends Religious Services: Never  . Active Member of Clubs or Organizations: Yes  . Attends Archivist Meetings: More than 4 times per year  . Marital Status: Separated    Tobacco Counseling Ready to quit: Not Answered Counseling given: Not Answered Comment: Quit 2007. Smoking cessationmaterials not required   Clinical Intake:  Pre-visit preparation completed: Yes  Pain : No/denies pain     Nutritional  Risks: None Diabetes: Yes CBG done?: No Did pt. bring in CBG monitor from home?: No  How often do you need to have someone help you when you read instructions, pamphlets, or other written materials from your doctor or pharmacy?: 1 - Never  Nutrition Risk Assessment:  Has the patient had any N/V/D within the last 2 months?  No  Does the patient have any non-healing wounds?  No  Has the patient had any unintentional weight loss or weight gain?  No   Diabetes:  Is the patient diabetic?  Yes  If diabetic, was a CBG obtained today?  No  Did the patient bring in their glucometer from home?  No  How often do you monitor your CBG's? Pt does not actively check blood sugar.   Financial Strains and Diabetes Management:  Are you having any financial strains with the device, your supplies or your medication? No .  Does the patient want to be seen by Chronic Care Management for management of  their diabetes?  No  Would the patient like to be referred to a Nutritionist or for Diabetic Management?  No   Diabetic Exams:  Diabetic Eye Exam: Completed 08/26/20 Dr. Wallace [facility].   Diabetic Foot Exam: Completed 07/10/20.   Interpreter Needed?: No  Information entered by :: [name] LPN   Activities of Daily Living In your present state of health, do you have any difficulty performing the following activities: 11/04/2020  Hearing? N  Comment declines hearing aids  Vision? N  Difficulty concentrating or making decisions? N  Walking or climbing stairs? N  Dressing or bathing? N  Doing errands, shopping? N  Preparing Food and eating ? N  Using the Toilet? N  In the past six months, have you accidently leaked urine? N  Do you have problems with loss of bowel control? N  Managing your Medications? N  Managing your Finances? N  Housekeeping or managing your Housekeeping? N  Some recent data might be hidden    Patient Care Team: Juline Patch, MD as PCP - General  (Family Medicine) Nori Riis, PA-C as Physician Assistant (Urology)  Indicate any recent Medical Services you may have received from other than [facility] providers in the past year (date may be approximate).     Assessment:   This is a routine wellness examination for [place].  Hearing/Vision screen  Hearing Screening   125Hz  250Hz  500Hz  1000Hz  2000Hz  3000Hz  4000Hz  6000Hz  8000Hz   Right ear:           Left ear:           Comments: Pt denies hearing difficulty   Vision Screening Comments: [facility] screen[facility]  Dietary issues and exercise activities discussed: Current Exercise Habits: The patient does not participate in regular exercise at present, Exercise limited by: None identified  Goals    . Exercise 150 min/wk Moderate Activity     Recommend to exercise at least 150 minutes per week      Depression Screen PHQ 2/9 Scores 11/04/2020 07/10/2020 03/20/2020 08/17/2019 09/23/2018 11/10/2017 06/08/2017  PHQ - 2 Score 1 0 0 0 0 1 0  PHQ- 9 Score 2 2 1  0 0 - -    Fall Risk Fall Risk  11/04/2020 07/10/2020 03/20/2020 09/30/2018 11/10/2017  Falls in the past year? 0 0 0 0 No  Comment - - - Emmi Telephone Survey: data to providers prior to load -  Number falls in past yr: 0 - - - -  Injury with Fall? 0 - - - -  Risk for fall due to : No Fall Risks - - - -  Follow up Falls prevention discussed Falls evaluation completed Falls evaluation completed - -    FALL RISK PREVENTION PERTAINING TO THE HOME:  Any stairs in or around the home? Yes  If so, are there any without handrails? Yes  - step up into kitchen Home free of loose throw rugs in walkways, pet beds, electrical cords, etc? Yes  Adequate lighting in your home to reduce risk of falls? Yes   ASSISTIVE DEVICES UTILIZED TO PREVENT FALLS:  Life alert? No  Use of a cane, walker or w/c? No  Grab bars in the bathroom? Yes  Shower chair or bench in shower? Yes  Elevated toilet seat or a handicapped  toilet? No   TIMED UP AND GO:  Was the test performed? No .   Cognitive Function: Pt declined 6CIT for 2021 AWV  6CIT Screen 11/10/2017  What Year? 0 points  What month? 0 points  What time? 3 points  Count back from 20 0 points  Months in reverse 0 points  Repeat phrase 4 points  Total Score 7    Immunizations Immunization History  Administered Date(s) Administered  . Moderna Sars-Covid-2 Vaccination 12/29/2019, 01/29/2020  . Pneumococcal Conjugate-13 08/20/2015  . Pneumococcal Polysaccharide-23 09/25/2013  . Tdap 09/25/2013    TDAP status: Up to date  Flu Vaccine status: Declined, Education has been provided regarding the importance of this vaccine but patient still declined. Advised may receive this vaccine at local pharmacy or Health Dept. Aware to provide a copy of the vaccination record if obtained from local pharmacy or Health Dept. Verbalized acceptance and understanding.  Pneumococcal vaccine status: Up to date  Covid-19 vaccine status: Completed vaccines  Qualifies for Shingles Vaccine? Yes   Zostavax completed Yes   - per patient  Shingrix Completed?: No.    Education has been provided regarding the importance of this vaccine. Patient has been advised to call insurance company to determine out of pocket expense if they have not yet received this vaccine. Advised may also receive vaccine at local pharmacy or Health Dept. Verbalized acceptance and understanding.  Screening Tests Health Maintenance  Topic Date Due  . COVID-19 Vaccine (3 - Booster for Moderna series) 07/31/2020  . INFLUENZA VACCINE  02/13/2021 (Originally 06/16/2020)  . HEMOGLOBIN A1C  01/12/2021  . FOOT EXAM  07/10/2021  . OPHTHALMOLOGY EXAM  08/26/2021  . TETANUS/TDAP  09/26/2023  . COLONOSCOPY  09/05/2025  . Hepatitis C Screening  Completed  . PNA vac Low Risk Adult  Completed    Health Maintenance  Health Maintenance Due  Topic Date Due  . COVID-19 Vaccine (3 - Booster for Moderna  series) 07/31/2020    Colorectal cancer screening: Type of screening: Colonoscopy. Completed 09/06/15. Repeat every 10 years  Lung Cancer Screening: (Low Dose CT Chest recommended if Age 33-80 years, 30 pack-year currently smoking OR have quit w/in 15years.) does not qualify.   Additional Screening:  Hepatitis C Screening: does qualify; Completed 12/08/16  Vision Screening: Recommended annual ophthalmology exams for early detection of glaucoma and other disorders of the eye. Is the patient up to date with their annual eye exam?  Yes  Who is the provider or what is the name of the office in which the patient attends annual eye exams? [place] Screening: Recommended annual dental exams for proper oral hygiene  Community Resource Referral / Chronic Care Management: CRR required this visit?  No   CCM required this visit?  No      Plan:     I have personally reviewed and noted the following in the patient's chart:   . Medical and social history . Use of alcohol, tobacco or illicit drugs  . Current medications and supplements . Functional ability and status . Nutritional status . Physical activity . Advanced directives . List of other physicians . Hospitalizations, surgeries, and ER visits in previous 12 months . Vitals . Screenings to include cognitive, depression, and falls . Referrals and appointments  In addition, I have reviewed and discussed with patient certain preventive protocols, quality metrics, and best practice recommendations. A written personalized care plan for preventive services as well as general preventive health recommendations were provided to patient.     [name], LPN   [id number]   Nurse Notes: none

## 2020-11-13 ENCOUNTER — Encounter: Payer: Self-pay | Admitting: Family Medicine

## 2020-11-13 ENCOUNTER — Ambulatory Visit (INDEPENDENT_AMBULATORY_CARE_PROVIDER_SITE_OTHER): Payer: Medicare PPO | Admitting: Family Medicine

## 2020-11-13 ENCOUNTER — Other Ambulatory Visit: Payer: Self-pay

## 2020-11-13 VITALS — BP 136/80 | HR 80 | Ht 67.0 in | Wt 215.0 lb

## 2020-11-13 DIAGNOSIS — I10 Essential (primary) hypertension: Secondary | ICD-10-CM

## 2020-11-13 DIAGNOSIS — E119 Type 2 diabetes mellitus without complications: Secondary | ICD-10-CM | POA: Diagnosis not present

## 2020-11-13 DIAGNOSIS — E039 Hypothyroidism, unspecified: Secondary | ICD-10-CM

## 2020-11-13 DIAGNOSIS — E782 Mixed hyperlipidemia: Secondary | ICD-10-CM | POA: Diagnosis not present

## 2020-11-13 MED ORDER — LOSARTAN POTASSIUM-HCTZ 50-12.5 MG PO TABS: 1.0000 | ORAL_TABLET | Freq: Every day | ORAL | 1 refills | Status: DC

## 2020-11-13 MED ORDER — PRAVASTATIN SODIUM 10 MG PO TABS: 10.0000 mg | ORAL_TABLET | Freq: Every day | ORAL | 1 refills | Status: DC

## 2020-11-13 MED ORDER — GLIMEPIRIDE 2 MG PO TABS: ORAL_TABLET | ORAL | 1 refills | Status: DC

## 2020-11-13 MED ORDER — LEVOTHYROXINE SODIUM 125 MCG PO TABS: 125.0000 ug | ORAL_TABLET | Freq: Every day | ORAL | 1 refills | Status: DC

## 2020-11-13 MED ORDER — METFORMIN HCL 500 MG PO TABS: 500.0000 mg | ORAL_TABLET | Freq: Two times a day (BID) | ORAL | 1 refills | Status: DC

## 2020-11-13 NOTE — Progress Notes (Signed)
Date:  11/13/2020   Name:  Daniel Kirby   DOB:  June 19, 1946   MRN:  [id number]   Chief Complaint: Diabetes, Hypertension, Hypothyroidism, and Hyperlipidemia  Diabetes He presents for his follow-up diabetic visit. He has type 2 diabetes mellitus. His disease course has been stable. There are no hypoglycemic associated symptoms. Pertinent negatives for hypoglycemia include no dizziness, headaches, nervousness/anxiousness or sweats. There are no diabetic associated symptoms. Pertinent negatives for diabetes include no blurred vision, no chest pain and no polydipsia. There are no hypoglycemic complications. Symptoms are stable. There are no diabetic complications. Pertinent negatives for diabetic complications include no CVA, PVD or retinopathy. Risk factors for coronary artery disease include male sex, dyslipidemia and hypertension. Current diabetic treatment includes oral agent (dual therapy) (metformen and glamiperide). He is compliant with treatment all of the time. His weight is stable. He is following a generally healthy diet. Meal planning includes avoidance of concentrated sweets and carbohydrate counting. He participates in exercise intermittently. An ACE inhibitor/angiotensin II receptor blocker is being taken.  Hypertension This is a chronic problem. The current episode started more than 1 year ago. The problem has been waxing and waning since onset. The problem is controlled. Pertinent negatives include no anxiety, blurred vision, chest pain, headaches, malaise/fatigue, neck pain, orthopnea, palpitations, peripheral edema, PND, shortness of breath or sweats. There are no associated agents to hypertension. There are no known risk factors for coronary artery disease. Past treatments include diuretics and angiotensin blockers. The current treatment provides moderate improvement. There is no history of angina, kidney disease, CAD/MI, CVA, heart failure, left ventricular hypertrophy, PVD or  retinopathy. Identifiable causes of hypertension include a thyroid problem. There is no history of chronic renal disease, a hypertension causing med or renovascular disease.  Hyperlipidemia This is a chronic problem. The current episode started more than 1 year ago. The problem is controlled. Recent lipid tests were reviewed and are normal. Exacerbating diseases include diabetes and hypothyroidism. He has no history of chronic renal disease. Factors aggravating his hyperlipidemia include thiazides. Pertinent negatives include no chest pain, focal sensory loss, focal weakness, leg pain, myalgias or shortness of breath. Current antihyperlipidemic treatment includes statins. The current treatment provides moderate improvement of lipids. There are no compliance problems.   Thyroid Problem Presents for follow-up visit. Patient reports no anxiety, constipation, depressed mood, diarrhea or palpitations. The symptoms have been stable. His past medical history is significant for diabetes and hyperlipidemia. There is no history of heart failure.    Lab Results  Component Value Date   CREATININE 0.93 08/18/2019   BUN 14 08/18/2019   NA 140 08/18/2019   K 3.8 08/18/2019   CL 99 08/18/2019   CO2 26 08/18/2019   Lab Results  Component Value Date   CHOL 153 03/20/2020   HDL 28 (L) 03/20/2020   LDLCALC 87 03/20/2020   TRIG 221 (H) 03/20/2020   CHOLHDL 4.2 12/08/2016   Lab Results  Component Value Date   TSH 1.150 07/12/2020   Lab Results  Component Value Date   HGBA1C 5.9 (H) 07/12/2020   Lab Results  Component Value Date   WBC 6.1 12/19/2018   HGB 14.5 12/19/2018   HCT 41.0 12/19/2018   MCV 86.5 12/19/2018   PLT 168 12/19/2018   Lab Results  Component Value Date   ALT 17 07/07/2018   AST 8 07/07/2018   ALKPHOS 66 07/07/2018   BILITOT 0.4 07/07/2018     Review of Systems  Constitutional: Negative  for chills, fever and malaise/fatigue.  HENT: Negative for drooling, ear discharge,  ear pain and sore throat.   Eyes: Negative for blurred vision.  Respiratory: Positive for cough. Negative for shortness of breath and wheezing.   Cardiovascular: Negative for chest pain, palpitations, orthopnea, leg swelling and PND.  Gastrointestinal: Negative for abdominal pain, blood in stool, constipation, diarrhea and nausea.  Endocrine: Negative for polydipsia.  Genitourinary: Negative for dysuria, frequency, hematuria and urgency.  Musculoskeletal: Negative for back pain, myalgias and neck pain.  Skin: Negative for rash.  Allergic/Immunologic: Negative for environmental allergies.  Neurological: Negative for dizziness, focal weakness and headaches.  Hematological: Does not bruise/bleed easily.  Psychiatric/Behavioral: Negative for suicidal ideas. The patient is not nervous/anxious.     Patient Active Problem List   Diagnosis Date Noted  . Type 2 diabetes mellitus without complication, without long-term current use of insulin ([place]) 01/18/2018  . Chronic low back pain without sciatica 06/08/2017  . Essential hypertension 12/08/2016  . Adult hypothyroidism 12/08/2016  . Arthritis 12/08/2016  . Acute anxiety 12/08/2016  . Cervical radiculopathy 12/08/2016  . Mixed hyperlipidemia 12/08/2016  . Special screening for malignant neoplasms, colon   . Benign neoplasm of ascending colon   . Major depressive disorder with single episode, in partial remission ([place]) 08/20/2015  . Erectile dysfunction 06/04/2015  . History of prostate cancer 06/04/2015    No Known Allergies  Past Surgical History:  Procedure Laterality Date  . COLONOSCOPY  2006   normal  . COLONOSCOPY WITH PROPOFOL N/A 09/06/2015   Procedure: COLONOSCOPY WITH PROPOFOL;  Surgeon: Lucilla Lame, MD;  Location: [facility];  Service: Endoscopy;  Laterality: N/A;  . POLYPECTOMY  09/06/2015   Procedure: POLYPECTOMY;  Surgeon: Lucilla Lame, MD;  Location: [facility];  Service: Endoscopy;;  . PROSTATE SURGERY     . robotic prostatectomy  2006    Social History   Tobacco Use  . Smoking status: Former Smoker    Packs/day: 1.00    Years: 16.00    Pack years: 16.00    Types: Cigars  . Smokeless tobacco: Current User    Types: Snuff  . Tobacco comment: Quit 2007. Smoking cessationmaterials not required  Vaping Use  . Vaping Use: Never used  Substance Use Topics  . Alcohol use: Not Currently    Alcohol/week: 0.0 standard drinks    Comment: occasional, 1-2 drinks per year  . Drug use: No     Medication list has been reviewed and updated.  Current Meds  Medication Sig  . glimepiride (AMARYL) 2 MG tablet One a day  . levothyroxine (SYNTHROID) 125 MCG tablet Take 1 tablet (125 mcg total) by mouth daily.  Marland Kitchen losartan-hydrochlorothiazide (HYZAAR) 50-12.5 MG tablet Take 1 tablet by mouth once daily  . metFORMIN (GLUCOPHAGE) 500 MG tablet Take 1 tablet (500 mg total) by mouth 2 (two) times daily with a meal.  . NONFORMULARY OR COMPOUNDED ITEM Trimix (30/1/10)-(Pap/Phent/PGE)  Dosage: Inject 0.5 cc per injection   Vial 66ml  Qty #10 Refills [place] [phone] Fax [phone]  . pravastatin (PRAVACHOL) 10 MG tablet Take 1 tablet by mouth daily.  . [DISCONTINUED] meloxicam (MOBIC) 7.5 MG tablet Take 1 tablet by mouth once daily  . [DISCONTINUED] sildenafil (VIAGRA) 100 MG tablet Take 1 tablet (100 mg total) by mouth daily as needed for erectile dysfunction. Take two hours prior to intercourse on an empty stomach    PHQ 2/9 Scores 11/13/2020 11/04/2020 07/10/2020 03/20/2020  PHQ - 2  Score 0 1 0 0  PHQ- 9 Score 0 2 2 1     GAD 7 : Generalized Anxiety Score 11/13/2020 07/10/2020 03/20/2020  Nervous, Anxious, on Edge 0 0 0  Control/stop worrying 0 0 0  Worry too much - different things 0 0 0  Trouble relaxing 0 0 0  Restless 0 0 0  Easily annoyed or irritable 0 0 0  Afraid - awful might happen 0 0 0  Total GAD 7 Score 0 0 0    BP Readings from Last 3 Encounters:   11/13/20 136/80  07/10/20 130/62  06/03/20 137/85    Physical Exam Vitals and nursing note reviewed.  HENT:     Head: Normocephalic.     Right Ear: Tympanic membrane, ear canal and external ear normal.     Left Ear: Tympanic membrane, ear canal and external ear normal.     Nose: Nose normal. No congestion or rhinorrhea.     Mouth/Throat:     Mouth: Oropharynx is clear and moist. Mucous membranes are moist.  Eyes:     General: No scleral icterus.       Right eye: No discharge.        Left eye: No discharge.     Extraocular Movements: EOM normal.     Conjunctiva/sclera: Conjunctivae normal.     Pupils: Pupils are equal, round, and reactive to light.  Neck:     Thyroid: No thyromegaly.     Vascular: No JVD.     Trachea: No tracheal deviation.  Cardiovascular:     Rate and Rhythm: Normal rate and regular rhythm.     Pulses: Intact distal pulses.     Heart sounds: Normal heart sounds. No murmur heard. No friction rub. No gallop.   Pulmonary:     Effort: No respiratory distress.     Breath sounds: Normal breath sounds. No stridor. No wheezing, rhonchi or rales.  Chest:     Chest wall: No tenderness.  Abdominal:     General: Bowel sounds are normal.     Palpations: Abdomen is soft. There is no hepatosplenomegaly or mass.     Tenderness: There is no abdominal tenderness. There is no CVA tenderness, guarding or rebound.  Musculoskeletal:        General: No tenderness or edema. Normal range of motion.     Cervical back: Normal range of motion and neck supple.  Lymphadenopathy:     Cervical: No cervical adenopathy.  Skin:    General: Skin is warm.     Findings: No rash.  Neurological:     Mental Status: He is alert and oriented to person, place, and time.     Cranial Nerves: No cranial nerve deficit.     Deep Tendon Reflexes: Strength normal and reflexes are normal and symmetric.     Wt Readings from Last 3 Encounters:  11/13/20 215 lb (97.5 kg)  07/10/20 222 lb (100.7  kg)  06/03/20 223 lb (101.2 kg)    BP 136/80   Pulse 80   Ht 5\' 7"  (1.702 m)   Wt 215 lb (97.5 kg)   BMI 33.67 kg/m   Assessment and Plan: 1. Type 2 diabetes mellitus without complication, without long-term current use of insulin (HCC) Chronic.  Controlled.  Stable.  Last A1c is 5.9%.  Patient will continue glimepiride 2 mg once a day and Metformin 500 mg twice a day.  Will check A1c and renal function panel for current level of control. - glimepiride (  AMARYL) 2 MG tablet; One a day  Dispense: 90 tablet; Refill: 1 - metFORMIN (GLUCOPHAGE) 500 MG tablet; Take 1 tablet (500 mg total) by mouth 2 (two) times daily with a meal.  Dispense: 180 tablet; Refill: 1 - HgB A1c - Comprehensive Metabolic Panel (CMET)  2. Adult hypothyroidism Chronic.  Controlled.  Stable.  Will check TSH with thyroid panel.  Patient will likely continue levothyroxine 125 mcg daily. - levothyroxine (SYNTHROID) 125 MCG tablet; Take 1 tablet (125 mcg total) by mouth daily.  Dispense: 90 tablet; Refill: 1 - Thyroid Panel With TSH  3. Essential hypertension Chronic.  Controlled.  Stable.  Blood pressure is 136/80.  Patient will continue losartan hydrochlorothiazide 50-12.5 mg once a day.  We will check CMP for current electrolytes and GFR. - losartan-hydrochlorothiazide (HYZAAR) 50-12.5 MG tablet; Take 1 tablet by mouth daily.  Dispense: 90 tablet; Refill: 1 - Comprehensive Metabolic Panel (CMET)  4. Mixed hyperlipidemia Chronic.  Controlled.  Stable.  Continue pravastatin 10 mg once a day.  Will check lipid panel. - pravastatin (PRAVACHOL) 10 MG tablet; Take 1 tablet (10 mg total) by mouth daily.  Dispense: 90 tablet; Refill: 1 - Lipid Panel With LDL/HDL Ratio

## 2020-11-14 LAB — COMPREHENSIVE METABOLIC PANEL
ALT: 27 IU/L (ref 0–44)
AST: 12 IU/L (ref 0–40)
Albumin/Globulin Ratio: 1.5 (ref 1.2–2.2)
Albumin: 4.6 g/dL (ref 3.7–4.7)
Alkaline Phosphatase: 82 IU/L (ref 44–121)
BUN/Creatinine Ratio: 9 — ABNORMAL LOW (ref 10–24)
BUN: 9 mg/dL (ref 8–27)
Bilirubin Total: 0.3 mg/dL (ref 0.0–1.2)
CO2: 22 mmol/L (ref 20–29)
Calcium: 9.9 mg/dL (ref 8.6–10.2)
Chloride: 97 mmol/L (ref 96–106)
Creatinine, Ser: 1.01 mg/dL (ref 0.76–1.27)
GFR calc Af Amer: 84 mL/min/{1.73_m2} (ref >59)
GFR calc non Af Amer: 73 mL/min/{1.73_m2} (ref >59)
Globulin, Total: 3 g/dL (ref 1.5–4.5)
Glucose: 189 mg/dL — ABNORMAL HIGH (ref 65–99)
Potassium: 4.5 mmol/L (ref 3.5–5.2)
Sodium: 136 mmol/L (ref 134–144)
Total Protein: 7.6 g/dL (ref 6.0–8.5)

## 2020-11-14 LAB — HEMOGLOBIN A1C
Est. average glucose Bld gHb Est-mCnc: 123 mg/dL
Hgb A1c MFr Bld: 5.9 % — ABNORMAL HIGH (ref 4.8–5.6)

## 2020-11-14 LAB — THYROID PANEL WITH TSH
Free Thyroxine Index: 2.2 (ref 1.2–4.9)
T3 Uptake Ratio: 29 % (ref 24–39)
T4, Total: 7.6 ug/dL (ref 4.5–12.0)
TSH: 0.906 u[IU]/mL (ref 0.450–4.500)

## 2020-11-14 LAB — LIPID PANEL WITH LDL/HDL RATIO
Cholesterol, Total: 140 mg/dL (ref 100–199)
HDL: 32 mg/dL — ABNORMAL LOW (ref >39)
LDL Chol Calc (NIH): 80 mg/dL (ref 0–99)
LDL/HDL Ratio: 2.5 ratio (ref 0.0–3.6)
Triglycerides: 158 mg/dL — ABNORMAL HIGH (ref 0–149)
VLDL Cholesterol Cal: 28 mg/dL (ref 5–40)

## 2020-11-25 ENCOUNTER — Other Ambulatory Visit: Payer: Self-pay

## 2020-11-25 ENCOUNTER — Telehealth: Payer: Self-pay

## 2020-11-25 ENCOUNTER — Ambulatory Visit (INDEPENDENT_AMBULATORY_CARE_PROVIDER_SITE_OTHER): Payer: Medicare PPO | Admitting: Family Medicine

## 2020-11-25 ENCOUNTER — Encounter: Payer: Self-pay | Admitting: Family Medicine

## 2020-11-25 VITALS — BP 120/70 | HR 80 | Temp 98.5°F | Ht 67.0 in | Wt 205.0 lb

## 2020-11-25 DIAGNOSIS — R059 Cough, unspecified: Secondary | ICD-10-CM

## 2020-11-25 DIAGNOSIS — J0100 Acute maxillary sinusitis, unspecified: Secondary | ICD-10-CM

## 2020-11-25 MED ORDER — BENZONATATE 100 MG PO CAPS: 100.0000 mg | ORAL_CAPSULE | Freq: Every evening | ORAL | 0 refills | Status: DC

## 2020-11-25 MED ORDER — AMOXICILLIN-POT CLAVULANATE 875-125 MG PO TABS: 1.0000 | ORAL_TABLET | Freq: Two times a day (BID) | ORAL | 0 refills | Status: DC

## 2020-11-25 NOTE — Telephone Encounter (Unsigned)
Copied from [place] [id number]. Topic: Quick Communication - See Telephone Encounter >> Nov 25, 2020  2:11 PM L[place] wrote: CRM for notification. See Telephone encounter for: 11/25/20.336 [id number] PT has a cough mainly at nite but in the day getting  frequent now, definitely sometime of congestion fails DT states really needs an appt Wants a cb today getting concerned FU

## 2020-11-25 NOTE — Telephone Encounter (Signed)
Seeing at 4

## 2020-11-25 NOTE — Progress Notes (Signed)
Date:  11/25/2020   Name:  Daniel Kirby   DOB:  09-02-1946   MRN:  [id number]   Chief Complaint: Sinusitis (Cough, hoarse, having clear liquid production from "coughing spells", no fever- has been home. Going on x 10 days)  Sinusitis This is a new problem. The current episode started 1 to 4 weeks ago. The problem has been waxing and waning since onset. There has been no fever. Associated symptoms include congestion, coughing and sinus pressure. Pertinent negatives include no chills, diaphoresis, ear pain, headaches, hoarse voice, neck pain, shortness of breath, sneezing, sore throat or swollen glands. Past treatments include nothing. The treatment provided mild relief.  Cough This is a new problem. The current episode started 1 to 4 weeks ago (2 weeks). The problem has been waxing and waning. The cough is productive of sputum. Pertinent negatives include no chest pain, chills, ear congestion, ear pain, fever, headaches, heartburn, hemoptysis, myalgias, nasal congestion, postnasal drip, rash, rhinorrhea, sore throat, shortness of breath, sweats, weight loss or wheezing. There is no history of environmental allergies.    Lab Results  Component Value Date   CREATININE 1.01 11/13/2020   BUN 9 11/13/2020   NA 136 11/13/2020   K 4.5 11/13/2020   CL 97 11/13/2020   CO2 22 11/13/2020   Lab Results  Component Value Date   CHOL 140 11/13/2020   HDL 32 (L) 11/13/2020   LDLCALC 80 11/13/2020   TRIG 158 (H) 11/13/2020   CHOLHDL 4.2 12/08/2016   Lab Results  Component Value Date   TSH 0.906 11/13/2020   Lab Results  Component Value Date   HGBA1C 5.9 (H) 11/13/2020   Lab Results  Component Value Date   WBC 6.1 12/19/2018   HGB 14.5 12/19/2018   HCT 41.0 12/19/2018   MCV 86.5 12/19/2018   PLT 168 12/19/2018   Lab Results  Component Value Date   ALT 27 11/13/2020   AST 12 11/13/2020   ALKPHOS 82 11/13/2020   BILITOT 0.3 11/13/2020     Review of Systems  Constitutional:  Negative for chills, diaphoresis, fever and weight loss.  HENT: Positive for congestion and sinus pressure. Negative for drooling, ear discharge, ear pain, hoarse voice, postnasal drip, rhinorrhea, sneezing and sore throat.   Respiratory: Positive for cough. Negative for hemoptysis, shortness of breath and wheezing.   Cardiovascular: Negative for chest pain, palpitations and leg swelling.  Gastrointestinal: Negative for abdominal pain, blood in stool, constipation, diarrhea, heartburn and nausea.  Endocrine: Negative for polydipsia.  Genitourinary: Negative for dysuria, frequency, hematuria and urgency.  Musculoskeletal: Negative for back pain, myalgias and neck pain.  Skin: Negative for rash.  Allergic/Immunologic: Negative for environmental allergies.  Neurological: Negative for dizziness and headaches.  Hematological: Does not bruise/bleed easily.  Psychiatric/Behavioral: Negative for suicidal ideas. The patient is not nervous/anxious.     Patient Active Problem List   Diagnosis Date Noted  . Type 2 diabetes mellitus without complication, without long-term current use of insulin ([place]) 01/18/2018  . Chronic low back pain without sciatica 06/08/2017  . Essential hypertension 12/08/2016  . Adult hypothyroidism 12/08/2016  . Arthritis 12/08/2016  . Acute anxiety 12/08/2016  . Cervical radiculopathy 12/08/2016  . Mixed hyperlipidemia 12/08/2016  . Special screening for malignant neoplasms, colon   . Benign neoplasm of ascending colon   . Major depressive disorder with single episode, in partial remission ([place]) 08/20/2015  . Erectile dysfunction 06/04/2015  . History of prostate cancer 06/04/2015    No  Known Allergies  Past Surgical History:  Procedure Laterality Date  . COLONOSCOPY  2006   normal  . COLONOSCOPY WITH PROPOFOL N/A 09/06/2015   Procedure: COLONOSCOPY WITH PROPOFOL;  Surgeon: Lucilla Lame, MD;  Location: [facility];  Service: Endoscopy;  Laterality: N/A;  .  POLYPECTOMY  09/06/2015   Procedure: POLYPECTOMY;  Surgeon: Lucilla Lame, MD;  Location: [facility];  Service: Endoscopy;;  . PROSTATE SURGERY    . robotic prostatectomy  2006    Social History   Tobacco Use  . Smoking status: Former Smoker    Packs/day: 1.00    Years: 16.00    Pack years: 16.00    Types: Cigars  . Smokeless tobacco: Current User    Types: Snuff  . Tobacco comment: Quit 2007. Smoking cessationmaterials not required  Vaping Use  . Vaping Use: Never used  Substance Use Topics  . Alcohol use: Not Currently    Alcohol/week: 0.0 standard drinks    Comment: occasional, 1-2 drinks per year  . Drug use: No     Medication list has been reviewed and updated.  No outpatient medications have been marked as taking for the 11/25/20 encounter (Office Visit) with Juline Patch, MD.    [facility] 2/9 Scores 11/13/2020 11/04/2020 07/10/2020 03/20/2020  PHQ - 2 Score 0 1 0 0  PHQ- 9 Score 0 2 2 1     GAD 7 : Generalized Anxiety Score 11/13/2020 07/10/2020 03/20/2020  Nervous, Anxious, on Edge 0 0 0  Control/stop worrying 0 0 0  Worry too much - different things 0 0 0  Trouble relaxing 0 0 0  Restless 0 0 0  Easily annoyed or irritable 0 0 0  Afraid - awful might happen 0 0 0  Total GAD 7 Score 0 0 0    BP Readings from Last 3 Encounters:  11/25/20 120/70  11/13/20 136/80  07/10/20 130/62    Physical Exam Vitals and nursing note reviewed.  HENT:     Head: Normocephalic.     Right Ear: Tympanic membrane and external ear normal.     Left Ear: Tympanic membrane and external ear normal.     Nose: Congestion present.     Mouth/Throat:     Mouth: Oropharynx is clear and moist. Mucous membranes are moist.  Eyes:     General: No scleral icterus.       Right eye: No discharge.        Left eye: No discharge.     Extraocular Movements: EOM normal.     Conjunctiva/sclera: Conjunctivae normal.     Pupils: Pupils are equal, round, and reactive to light.  Neck:      Thyroid: No thyromegaly.     Vascular: No JVD.     Trachea: No tracheal deviation.  Cardiovascular:     Rate and Rhythm: Normal rate and regular rhythm.     Pulses: Intact distal pulses.     Heart sounds: Normal heart sounds. No murmur heard. No friction rub. No gallop.   Pulmonary:     Effort: No respiratory distress.     Breath sounds: Normal breath sounds. No decreased breath sounds, wheezing, rhonchi or rales.  Abdominal:     General: Bowel sounds are normal.     Palpations: Abdomen is soft. There is no hepatosplenomegaly or mass.     Tenderness: There is no abdominal tenderness. There is no CVA tenderness, guarding or rebound.  Musculoskeletal:        General: No  tenderness or edema. Normal range of motion.     Cervical back: Normal range of motion and neck supple.  Lymphadenopathy:     Cervical: No cervical adenopathy.  Skin:    General: Skin is warm.     Findings: No rash.  Neurological:     Mental Status: He is alert and oriented to person, place, and time.     Cranial Nerves: No cranial nerve deficit.     Deep Tendon Reflexes: Strength normal and reflexes are normal and symmetric.     Wt Readings from Last 3 Encounters:  11/25/20 205 lb (93 kg)  11/13/20 215 lb (97.5 kg)  07/10/20 222 lb (100.7 kg)    BP 120/70   Pulse 80   Temp 98.5 F (36.9 C) (Oral)   Ht 5\' 7"  (1.702 m)   Wt 205 lb (93 kg)   SpO2 97%   BMI 32.11 kg/m   Assessment and Plan: 1. Acute maxillary sinusitis, recurrence not specified New onset.  Persistent.  Patient has symptoms of upper respiratory congestion postnasal drainage consistent with sinusitis.  We will initiate Augmentin 875 mg twice a day for 10 days. - amoxicillin-clavulanate (AUGMENTIN) 875-125 MG tablet; Take 1 tablet by mouth 2 (two) times daily.  Dispense: 20 tablet; Refill: 0  2. Cough Chronic.  Episodic.  Patient's had a cough for several weeks as well particularly when he lays down at night.  There is a clear production of  sputum but does not appear to have a reflux issue.  Patient may be having postnasal drainage.  We will use Tessalon Perles at night as we see if there is clearance with the Augmentin for the upper respiratory infection.

## 2020-12-11 ENCOUNTER — Telehealth: Payer: Self-pay

## 2020-12-11 NOTE — Telephone Encounter (Unsigned)
Copied from [place] [phone]. Topic: General - Other >> Dec 11, 2020  2:51 PM Mcneil, Ja-Kwan wrote: Reason for CRM: Pt stated he has completed the medication and now he would like to schedule an appt for the chest x-ray. Pt requests call back

## 2020-12-12 NOTE — Telephone Encounter (Signed)
Will put order in when pt comes

## 2020-12-19 ENCOUNTER — Other Ambulatory Visit: Payer: Self-pay | Admitting: Family Medicine

## 2020-12-19 DIAGNOSIS — E039 Hypothyroidism, unspecified: Secondary | ICD-10-CM

## 2021-03-14 ENCOUNTER — Encounter: Payer: Self-pay | Admitting: Family Medicine

## 2021-03-14 ENCOUNTER — Ambulatory Visit (INDEPENDENT_AMBULATORY_CARE_PROVIDER_SITE_OTHER): Payer: Medicare PPO | Admitting: Family Medicine

## 2021-03-14 ENCOUNTER — Other Ambulatory Visit: Payer: Self-pay

## 2021-03-14 VITALS — BP 138/100 | HR 80 | Ht 67.0 in | Wt 212.0 lb

## 2021-03-14 DIAGNOSIS — E119 Type 2 diabetes mellitus without complications: Secondary | ICD-10-CM

## 2021-03-14 MED ORDER — METFORMIN HCL 500 MG PO TABS: 500.0000 mg | ORAL_TABLET | Freq: Two times a day (BID) | ORAL | 1 refills | Status: DC

## 2021-03-14 MED ORDER — GLIMEPIRIDE 2 MG PO TABS: ORAL_TABLET | ORAL | 1 refills | Status: DC

## 2021-03-14 NOTE — Progress Notes (Signed)
Date:  03/14/2021   Name:  Daniel Kirby   DOB:  1946/11/10   MRN:  [id number]   Chief Complaint: Diabetes  Diabetes He presents for his follow-up diabetic visit. He has type 2 diabetes mellitus. His disease course has been stable. There are no hypoglycemic associated symptoms. Pertinent negatives for hypoglycemia include no dizziness, headaches or nervousness/anxiousness. There are no diabetic associated symptoms. Pertinent negatives for diabetes include no blurred vision, no chest pain, no fatigue, no foot paresthesias, no foot ulcerations, no polydipsia, no polyphagia, no polyuria, no visual change, no weakness and no weight loss. There are no hypoglycemic complications. Symptoms are stable. There are no diabetic complications. There are no known risk factors for coronary artery disease. Current diabetic treatment includes oral agent (dual therapy). He is compliant with treatment all of the time. He is following a generally healthy diet. Meal planning includes avoidance of concentrated sweets and carbohydrate counting. He participates in exercise daily. His home blood glucose trend is fluctuating minimally. An ACE inhibitor/angiotensin II receptor blocker is being taken. Eye exam is current.    Lab Results  Component Value Date   CREATININE 1.01 11/13/2020   BUN 9 11/13/2020   NA 136 11/13/2020   K 4.5 11/13/2020   CL 97 11/13/2020   CO2 22 11/13/2020   Lab Results  Component Value Date   CHOL 140 11/13/2020   HDL 32 (L) 11/13/2020   LDLCALC 80 11/13/2020   TRIG 158 (H) 11/13/2020   CHOLHDL 4.2 12/08/2016   Lab Results  Component Value Date   TSH 0.906 11/13/2020   Lab Results  Component Value Date   HGBA1C 5.9 (H) 11/13/2020   Lab Results  Component Value Date   WBC 6.1 12/19/2018   HGB 14.5 12/19/2018   HCT 41.0 12/19/2018   MCV 86.5 12/19/2018   PLT 168 12/19/2018   Lab Results  Component Value Date   ALT 27 11/13/2020   AST 12 11/13/2020   ALKPHOS 82  11/13/2020   BILITOT 0.3 11/13/2020     Review of Systems  Constitutional: Negative for chills, fatigue, fever and weight loss.  HENT: Negative for drooling, ear discharge, ear pain and sore throat.   Eyes: Negative for blurred vision.  Respiratory: Negative for cough, shortness of breath and wheezing.   Cardiovascular: Negative for chest pain, palpitations and leg swelling.  Gastrointestinal: Negative for abdominal pain, blood in stool, constipation, diarrhea and nausea.  Endocrine: Negative for polydipsia, polyphagia and polyuria.  Genitourinary: Negative for dysuria, frequency, hematuria and urgency.  Musculoskeletal: Negative for back pain, myalgias and neck pain.  Skin: Negative for rash.  Allergic/Immunologic: Negative for environmental allergies.  Neurological: Negative for dizziness, weakness and headaches.  Hematological: Does not bruise/bleed easily.  Psychiatric/Behavioral: Negative for suicidal ideas. The patient is not nervous/anxious.     Patient Active Problem List   Diagnosis Date Noted  . Type 2 diabetes mellitus without complication, without long-term current use of insulin ([place]) 01/18/2018  . Chronic low back pain without sciatica 06/08/2017  . Essential hypertension 12/08/2016  . Adult hypothyroidism 12/08/2016  . Arthritis 12/08/2016  . Acute anxiety 12/08/2016  . Cervical radiculopathy 12/08/2016  . Mixed hyperlipidemia 12/08/2016  . Special screening for malignant neoplasms, colon   . Benign neoplasm of ascending colon   . Major depressive disorder with single episode, in partial remission ([place]) 08/20/2015  . Erectile dysfunction 06/04/2015  . History of prostate cancer 06/04/2015    No Known Allergies  Past  Surgical History:  Procedure Laterality Date  . COLONOSCOPY  2006   normal  . COLONOSCOPY WITH PROPOFOL N/A 09/06/2015   Procedure: COLONOSCOPY WITH PROPOFOL;  Surgeon: Lucilla Lame, MD;  Location: [facility];  Service: Endoscopy;   Laterality: N/A;  . POLYPECTOMY  09/06/2015   Procedure: POLYPECTOMY;  Surgeon: Lucilla Lame, MD;  Location: [place];  Service: Endoscopy;;  . PROSTATE SURGERY    . robotic prostatectomy  2006    Social History   Tobacco Use  . Smoking status: Former Smoker    Packs/day: 1.00    Years: 16.00    Pack years: 16.00    Types: Cigars  . Smokeless tobacco: Current User    Types: Snuff  . Tobacco comment: Quit 2007. Smoking cessationmaterials not required  Vaping Use  . Vaping Use: Never used  Substance Use Topics  . Alcohol use: Not Currently    Alcohol/week: 0.0 standard drinks    Comment: occasional, 1-2 drinks per year  . Drug use: No     Medication list has been reviewed and updated.  Current Meds  Medication Sig  . glimepiride (AMARYL) 2 MG tablet One a day  . levothyroxine (SYNTHROID) 125 MCG tablet Take 1 tablet (125 mcg total) by mouth daily.  Marland Kitchen losartan-hydrochlorothiazide (HYZAAR) 50-12.5 MG tablet Take 1 tablet by mouth daily.  . metFORMIN (GLUCOPHAGE) 500 MG tablet Take 1 tablet (500 mg total) by mouth 2 (two) times daily with a meal.  . NONFORMULARY OR COMPOUNDED ITEM Trimix (30/1/10)-(Pap/Phent/PGE)  Dosage: Inject 0.5 cc per injection   Vial 59ml  Qty #10 Refills [place] [phone] Fax [phone]  . pravastatin (PRAVACHOL) 10 MG tablet Take 1 tablet (10 mg total) by mouth daily.    PHQ 2/9 Scores 03/14/2021 11/13/2020 11/04/2020 07/10/2020  PHQ - 2 Score 0 0 1 0  PHQ- 9 Score 2 0 2 2    GAD 7 : Generalized Anxiety Score 03/14/2021 11/13/2020 07/10/2020 03/20/2020  Nervous, Anxious, on Edge 0 0 0 0  Control/stop worrying 0 0 0 0  Worry too much - different things 0 0 0 0  Trouble relaxing 0 0 0 0  Restless 0 0 0 0  Easily annoyed or irritable 0 0 0 0  Afraid - awful might happen 0 0 0 0  Total GAD 7 Score 0 0 0 0    BP Readings from Last 3 Encounters:  03/14/21 (!) 138/100  11/25/20 120/70  11/13/20 136/80     Physical Exam Vitals and nursing note reviewed.  HENT:     Head: Normocephalic.     Right Ear: Tympanic membrane and external ear normal.     Left Ear: Tympanic membrane and external ear normal.     Nose: Nose normal.  Eyes:     General: No scleral icterus.       Right eye: No discharge.        Left eye: No discharge.     Conjunctiva/sclera: Conjunctivae normal.     Pupils: Pupils are equal, round, and reactive to light.  Neck:     Thyroid: No thyromegaly.     Vascular: No JVD.     Trachea: No tracheal deviation.  Cardiovascular:     Rate and Rhythm: Normal rate and regular rhythm.     Heart sounds: Normal heart sounds, S1 normal and S2 normal. No murmur heard.  No systolic murmur is present.  No diastolic murmur is present. No friction rub. No gallop.  No S3 or S4 sounds.   Pulmonary:     Effort: No respiratory distress.     Breath sounds: Normal breath sounds. No stridor. No wheezing, rhonchi or rales.  Chest:     Chest wall: No tenderness.  Abdominal:     General: Bowel sounds are normal.     Palpations: Abdomen is soft. There is no mass.     Tenderness: There is no abdominal tenderness. There is no guarding or rebound.  Musculoskeletal:        General: No tenderness. Normal range of motion.     Cervical back: Normal range of motion and neck supple.  Lymphadenopathy:     Cervical: No cervical adenopathy.  Skin:    General: Skin is warm.     Findings: No rash.  Neurological:     Mental Status: He is alert and oriented to person, place, and time.     Cranial Nerves: No cranial nerve deficit.     Deep Tendon Reflexes: Reflexes are normal and symmetric.     Wt Readings from Last 3 Encounters:  03/14/21 212 lb (96.2 kg)  11/25/20 205 lb (93 kg)  11/13/20 215 lb (97.5 kg)    BP (!) 138/100   Pulse 80   Ht 5\' 7"  (1.702 m)   Wt 212 lb (96.2 kg)   BMI 33.20 kg/m   Assessment and Plan:  1. Type 2 diabetes mellitus without complication, without long-term  current use of insulin (HCC) Chronic.  Controlled.  Stable.  Continue glimepiride 2 mg once a day and metformin 500 mg twice a day.  Will check A1c and microalbuminuria current status of the diabetes control. - HgB A1c - Microalbumin, urine - glimepiride (AMARYL) 2 MG tablet; One a day  Dispense: 90 tablet; Refill: 1 - metFORMIN (GLUCOPHAGE) 500 MG tablet; Take 1 tablet (500 mg total) by mouth 2 (two) times daily with a meal.  Dispense: 180 tablet; Refill: 1

## 2021-03-15 LAB — MICROALBUMIN, URINE: Microalbumin, Urine: 4.8 ug/mL

## 2021-03-15 LAB — HEMOGLOBIN A1C
Est. average glucose Bld gHb Est-mCnc: 111 mg/dL
Hgb A1c MFr Bld: 5.5 % (ref 4.8–5.6)

## 2021-03-20 ENCOUNTER — Other Ambulatory Visit: Payer: Self-pay | Admitting: Urology

## 2021-03-20 DIAGNOSIS — N3941 Urge incontinence: Secondary | ICD-10-CM

## 2021-04-17 DIAGNOSIS — H43822 Vitreomacular adhesion, left eye: Secondary | ICD-10-CM | POA: Diagnosis not present

## 2021-04-17 DIAGNOSIS — Z0100 Encounter for examination of eyes and vision without abnormal findings: Secondary | ICD-10-CM | POA: Diagnosis not present

## 2021-04-23 DIAGNOSIS — H2512 Age-related nuclear cataract, left eye: Secondary | ICD-10-CM | POA: Diagnosis not present

## 2021-05-22 ENCOUNTER — Other Ambulatory Visit: Payer: Self-pay

## 2021-05-22 ENCOUNTER — Encounter: Payer: Self-pay | Admitting: Ophthalmology

## 2021-06-02 ENCOUNTER — Ambulatory Visit: Payer: Medicare Other | Admitting: Urology

## 2021-06-03 NOTE — Discharge Instructions (Signed)

## 2021-06-04 ENCOUNTER — Encounter
Admission: RE | Disposition: A | Discharge status: Home / Self Care | Payer: Self-pay | Source: Home / Self Care | Attending: Ophthalmology

## 2021-06-04 ENCOUNTER — Encounter: Payer: Self-pay | Admitting: Ophthalmology

## 2021-06-04 ENCOUNTER — Other Ambulatory Visit: Payer: Self-pay

## 2021-06-04 ENCOUNTER — Ambulatory Visit: Payer: Medicare PPO | Admitting: Anesthesiology

## 2021-06-04 ENCOUNTER — Ambulatory Visit
Admission: RE | Admit: 2021-06-04 | Discharge: 2021-06-04 | Disposition: A | Discharge status: Home / Self Care | Payer: Medicare PPO | Attending: Ophthalmology | Admitting: Ophthalmology

## 2021-06-04 DIAGNOSIS — Z7984 Long term (current) use of oral hypoglycemic drugs: Secondary | ICD-10-CM | POA: Diagnosis not present

## 2021-06-04 DIAGNOSIS — Z79899 Other long term (current) drug therapy: Secondary | ICD-10-CM | POA: Insufficient documentation

## 2021-06-04 DIAGNOSIS — Z833 Family history of diabetes mellitus: Secondary | ICD-10-CM | POA: Insufficient documentation

## 2021-06-04 DIAGNOSIS — Z8546 Personal history of malignant neoplasm of prostate: Secondary | ICD-10-CM | POA: Diagnosis not present

## 2021-06-04 DIAGNOSIS — Z87891 Personal history of nicotine dependence: Secondary | ICD-10-CM | POA: Insufficient documentation

## 2021-06-04 DIAGNOSIS — Z841 Family history of disorders of kidney and ureter: Secondary | ICD-10-CM | POA: Diagnosis not present

## 2021-06-04 DIAGNOSIS — E1136 Type 2 diabetes mellitus with diabetic cataract: Secondary | ICD-10-CM | POA: Diagnosis not present

## 2021-06-04 DIAGNOSIS — Z8249 Family history of ischemic heart disease and other diseases of the circulatory system: Secondary | ICD-10-CM | POA: Insufficient documentation

## 2021-06-04 DIAGNOSIS — H2512 Age-related nuclear cataract, left eye: Secondary | ICD-10-CM | POA: Insufficient documentation

## 2021-06-04 DIAGNOSIS — E039 Hypothyroidism, unspecified: Secondary | ICD-10-CM | POA: Diagnosis not present

## 2021-06-04 DIAGNOSIS — H25812 Combined forms of age-related cataract, left eye: Secondary | ICD-10-CM | POA: Diagnosis not present

## 2021-06-04 HISTORY — PX: CATARACT EXTRACTION W/PHACO: SHX586

## 2021-06-04 LAB — GLUCOSE, CAPILLARY
Glucose-Capillary: 107 mg/dL — ABNORMAL HIGH (ref 70–99)
Glucose-Capillary: 121 mg/dL — ABNORMAL HIGH (ref 70–99)

## 2021-06-04 LAB — HM DIABETES EYE EXAM

## 2021-06-04 SURGERY — PHACOEMULSIFICATION, CATARACT, WITH IOL INSERTION
Anesthesia: Monitor Anesthesia Care | Site: Eye | Laterality: Left

## 2021-06-04 MED ORDER — CEFUROXIME OPHTHALMIC INJECTION 1 MG/0.1 ML
INJECTION | OPHTHALMIC | Status: DC | PRN
  Administered 2021-06-04: 0.1 mL via INTRACAMERAL

## 2021-06-04 MED ORDER — CYCLOPENTOLATE HCL 2 % OP SOLN
1.0000 [drp] | OPHTHALMIC | Status: AC | PRN
  Administered 2021-06-04 (×3): 1 [drp] via OPHTHALMIC

## 2021-06-04 MED ORDER — FENTANYL CITRATE (PF) 100 MCG/2ML IJ SOLN
INTRAMUSCULAR | Status: DC | PRN
  Administered 2021-06-04: 50 ug via INTRAVENOUS

## 2021-06-04 MED ORDER — LACTATED RINGERS IV SOLN: INTRAVENOUS | Status: DC

## 2021-06-04 MED ORDER — BRIMONIDINE TARTRATE-TIMOLOL 0.2-0.5 % OP SOLN
OPHTHALMIC | Status: DC | PRN
  Administered 2021-06-04: 1 [drp] via OPHTHALMIC

## 2021-06-04 MED ORDER — SIGHTPATH DOSE#1 NA HYALUR & NA CHOND-NA HYALUR IO KIT
PACK | INTRAOCULAR | Status: DC | PRN
  Administered 2021-06-04: 1 via OPHTHALMIC

## 2021-06-04 MED ORDER — SIGHTPATH DOSE#1 BSS IO SOLN
INTRAOCULAR | Status: DC | PRN
  Administered 2021-06-04: 1 mL

## 2021-06-04 MED ORDER — PHENYLEPHRINE HCL 10 % OP SOLN
1.0000 [drp] | OPHTHALMIC | Status: AC | PRN
  Administered 2021-06-04 (×3): 1 [drp] via OPHTHALMIC

## 2021-06-04 MED ORDER — SIGHTPATH DOSE#1 BSS IO SOLN
INTRAOCULAR | Status: DC | PRN
  Administered 2021-06-04: 84 mL via OPHTHALMIC

## 2021-06-04 MED ORDER — TETRACAINE HCL 0.5 % OP SOLN
1.0000 [drp] | OPHTHALMIC | Status: DC | PRN
  Administered 2021-06-04 (×3): 1 [drp] via OPHTHALMIC

## 2021-06-04 MED ORDER — MIDAZOLAM HCL 2 MG/2ML IJ SOLN
INTRAMUSCULAR | Status: DC | PRN
  Administered 2021-06-04: 1 mg via INTRAVENOUS

## 2021-06-04 SURGICAL SUPPLY — 16 items
CANNULA ANT/CHMB 27GA (MISCELLANEOUS) ×2 IMPLANT
GLOVE SURG ENC TEXT LTX SZ7.5 (GLOVE) ×2 IMPLANT
GLOVE SURG TRIUMPH 8.0 PF LTX (GLOVE) ×2 IMPLANT
GOWN STRL REUS W/ TWL LRG LVL3 (GOWN DISPOSABLE) ×2 IMPLANT
GOWN STRL REUS W/TWL LRG LVL3 (GOWN DISPOSABLE) ×4
LENS IOL DIOP 24.0 (Intraocular Lens) ×2 IMPLANT
LENS IOL TECNIS MONO 24.0 (Intraocular Lens) ×1 IMPLANT
MARKER SKIN DUAL TIP RULER LAB (MISCELLANEOUS) ×2 IMPLANT
NEEDLE CAPSULORHEX 25GA (NEEDLE) ×2 IMPLANT
NEEDLE FILTER BLUNT 18X 1/2SAF (NEEDLE) ×2
NEEDLE FILTER BLUNT 18X1 1/2 (NEEDLE) ×2 IMPLANT
PACK EYE AFTER SURG (MISCELLANEOUS) ×2 IMPLANT
SYR 3ML LL SCALE MARK (SYRINGE) ×4 IMPLANT
SYR TB 1ML LUER SLIP (SYRINGE) ×2 IMPLANT
WATER STERILE IRR 250ML POUR (IV SOLUTION) ×2 IMPLANT
WIPE NON LINTING 3.25X3.25 (MISCELLANEOUS) ×2 IMPLANT

## 2021-06-04 NOTE — Anesthesia Preprocedure Evaluation (Signed)
Anesthesia Evaluation  Patient identified by MRN, date of birth, ID band  History of Anesthesia Complications Negative for: history of anesthetic complications  Airway Mallampati: III  TM Distance: >3 FB Neck ROM: Full    Dental no notable dental hx.    Pulmonary neg pulmonary ROS, former smoker,    Pulmonary exam normal        Cardiovascular hypertension, Pt. on medications Normal cardiovascular exam     Neuro/Psych negative neurological ROS     GI/Hepatic negative GI ROS, Neg liver ROS,   Endo/Other  diabetes, Type 2, Oral Hypoglycemic AgentsHypothyroidism   Renal/GU negative Renal ROS     Musculoskeletal   Abdominal   Peds  Hematology   Anesthesia Other Findings   Reproductive/Obstetrics                            Anesthesia Physical Anesthesia Plan  ASA: 2  Anesthesia Plan: MAC   Post-op Pain Management:    Induction: Intravenous  PONV Risk Score and Plan: 1 and Midazolam, TIVA and Treatment may vary due to age or medical condition  Airway Management Planned: Nasal Cannula and Natural Airway  Additional Equipment: None  Intra-op Plan:   Post-operative Plan:   Informed Consent: I have reviewed the patients History and Physical, chart, labs and discussed the procedure including the risks, benefits and alternatives for the proposed anesthesia with the patient or authorized representative who has indicated his/her understanding and acceptance.       Plan Discussed with: CRNA  Anesthesia Plan Comments:         Anesthesia Quick Evaluation

## 2021-06-04 NOTE — H&P (Signed)
[facility]   Primary Care Physician:  Juline Patch, MD Ophthalmologist: Dr. Leandrew Koyanagi  Pre-Procedure History & Physical: HPI:  Daniel Kirby is a 75 y.o. male here for ophthalmic surgery.   Past Medical History:  Diagnosis Date   Abdominal pain    RUQ   Arthritis    right ankle   ED (erectile dysfunction)    HLD (hyperlipidemia)    Hypertension    Hypothyroid    Prostate cancer ([place])    Vertigo    1 episode only, approx 1 month ago    Past Surgical History:  Procedure Laterality Date   COLONOSCOPY  2006   normal   COLONOSCOPY WITH PROPOFOL N/A 09/06/2015   Procedure: COLONOSCOPY WITH PROPOFOL;  Surgeon: Lucilla Lame, MD;  Location: [place] [facility];  Service: Endoscopy;  Laterality: N/A;   POLYPECTOMY  09/06/2015   Procedure: POLYPECTOMY;  Surgeon: Lucilla Lame, MD;  Location: [facility];  Service: Endoscopy;;   PROSTATE SURGERY     robotic prostatectomy  2006    Prior to Admission medications   Medication Sig Start Date End Date Taking? Authorizing Provider  glimepiride (AMARYL) 2 MG tablet One a day 03/14/21  Yes Juline Patch, MD  levothyroxine (SYNTHROID) 125 MCG tablet Take 1 tablet (125 mcg total) by mouth daily. 11/13/20  Yes Juline Patch, MD  losartan-hydrochlorothiazide (HYZAAR) 50-12.5 MG tablet Take 1 tablet by mouth daily. 11/13/20  Yes Juline Patch, MD  metFORMIN (GLUCOPHAGE) 500 MG tablet Take 1 tablet (500 mg total) by mouth 2 (two) times daily with a meal. 03/14/21  Yes Juline Patch, MD  oxybutynin (DITROPAN) 5 MG tablet TAKE 1 TABLET BY MOUTH THREE TIMES DAILY 03/21/21  Yes McGowan, Larene Beach A, PA-C  NONFORMULARY OR COMPOUNDED ITEM Trimix (30/1/10)-(Pap/Phent/PGE)  Dosage: Inject 0.5 cc per injection   Vial 66ml  Qty #10 Refills 6  [place] [phone] Fax [phone] 06/03/20   Zara Council A, PA-C  pravastatin (PRAVACHOL) 10 MG tablet Take 1 tablet (10 mg total) by mouth daily. 11/13/20    Juline Patch, MD    Allergies as of 04/23/2021   (No Known Allergies)    Family History  Problem Relation Age of Onset   Heart attack Father    Diabetes Mother    Kidney disease Brother     Social History   Socioeconomic History   Marital status: Legally Separated    Spouse name: Not on file   Number of children: 3   Years of education: Not on file   Highest education level: 12th grade  Occupational History   Occupation: Retired  Tobacco Use   Smoking status: Former    Packs/day: 1.00    Years: 16.00    Pack years: 16.00    Types: Cigars, Cigarettes   Smokeless tobacco: Current    Types: Snuff   Tobacco comments:    Quit 2007. Smoking cessationmaterials not required  Vaping Use   Vaping Use: Never used  Substance and Sexual Activity   Alcohol use: Not Currently    Alcohol/week: 0.0 standard drinks    Comment: occasional, 1-2 drinks per year   Drug use: No   Sexual activity: Yes  Other Topics Concern   Not on file  Social History Narrative   Lives alone.    Social Determinants of Health   Financial Resource Strain: Low Risk    Difficulty of Paying Living Expenses: Not hard at all  Food Insecurity:  No Food Insecurity   Worried About Charity fundraiser in the Last Year: Never true   Ran Out of Food in the Last Year: Never true  Transportation Needs: No Transportation Needs   Lack of Transportation (Medical): No   Lack of Transportation (Non-Medical): No  Physical Activity: Inactive   Days of Exercise per Week: 0 days   Minutes of Exercise per Session: 0 min  Stress: No Stress Concern Present   Feeling of Stress : Not at all  Social Connections: Moderately Isolated   Frequency of Communication with Friends and Family: More than three times a week   Frequency of Social Gatherings with Friends and Family: Once a week   Attends Religious Services: Never   Marine scientist or Organizations: Yes   Attends Music therapist: More than 4  times per year   Marital Status: Separated  Intimate Partner Violence: Not At Risk   Fear of Current or Ex-Partner: No   Emotionally Abused: No   Physically Abused: No   Sexually Abused: No    Review of Systems: See HPI, otherwise negative ROS  Physical Exam: BP (!) 142/72   Pulse (!) 59   Temp (!) 97.5 F (36.4 C) (Temporal)   Resp 18   Ht 5\' 7"  (1.702 m)   Wt 97.1 kg   SpO2 97%   BMI 33.52 kg/m  General:   Alert,  pleasant and cooperative in NAD Head:  Normocephalic and atraumatic. Lungs:  Clear to auscultation.    Heart:  Regular rate and rhythm.   Impression/Plan: Daniel Kirby is here for ophthalmic surgery.  Risks, benefits, limitations, and alternatives regarding ophthalmic surgery have been reviewed with the patient.  Questions have been answered.  All parties agreeable.   Leandrew Koyanagi, MD  06/04/2021, 11:48 AM

## 2021-06-04 NOTE — Op Note (Signed)
OPERATIVE NOTE  Daniel Kirby [id number] 06/04/2021   PREOPERATIVE DIAGNOSIS:  Nuclear sclerotic cataract left eye. H25.12   POSTOPERATIVE DIAGNOSIS:    Nuclear sclerotic cataract left eye.     PROCEDURE:  Phacoemusification with posterior chamber intraocular lens placement of the left eye  Ultrasound time: Procedure(s): CATARACT EXTRACTION PHACO AND INTRAOCULAR LENS PLACEMENT (IOC) LEFT DIABETIC 5.51 01:10.7 (Left)  LENS:   Implant Name Type Inv. Item Serial No. Manufacturer Lot No. LRB No. Used Action  LENS IOL DIOP 24.0 - [id number] Intraocular Lens LENS IOL DIOP 24.0 [id number] JOHNSON   Left 1 Implanted      SURGEON:  Wyonia Hough, MD   ANESTHESIA:  Topical with tetracaine drops and 2% Xylocaine jelly, augmented with 1% preservative-free intracameral lidocaine.    COMPLICATIONS:  None.   DESCRIPTION OF PROCEDURE:  The patient was identified in the holding room and transported to the operating room and placed in the supine position under the operating microscope.  The left eye was identified as the operative eye and it was prepped and draped in the usual sterile ophthalmic fashion.   A 1 millimeter clear-corneal paracentesis was made at the 1:30 position.  0.5 ml of preservative-free 1% lidocaine was injected into the anterior chamber.  The anterior chamber was filled with Viscoat viscoelastic.  A 2.4 millimeter keratome was used to make a near-clear corneal incision at the 10:30 position.  .  A curvilinear capsulorrhexis was made with a cystotome and capsulorrhexis forceps.  Balanced salt solution was used to hydrodissect and hydrodelineate the nucleus.   Phacoemulsification was then used in stop and chop fashion to remove the lens nucleus and epinucleus.  The remaining cortex was then removed using the irrigation and aspiration handpiece. Provisc was then placed into the capsular bag to distend it for lens placement.  A lens was then injected into the capsular bag.  The  remaining viscoelastic was aspirated.   Wounds were hydrated with balanced salt solution.  The anterior chamber was inflated to a physiologic pressure with balanced salt solution.  No wound leaks were noted. Cefuroxime 0.1 ml of a 10mg /ml solution was injected into the anterior chamber for a dose of 1 mg of intracameral antibiotic at the completion of the case.   Timolol and Brimonidine drops were applied to the eye.  The patient was taken to the recovery room in stable condition without complications of anesthesia or surgery.  [name] 06/04/2021, 1:29 PM

## 2021-06-04 NOTE — Anesthesia Postprocedure Evaluation (Addendum)
Anesthesia Post Note  Patient: Daniel Kirby  Procedure(s) Performed: CATARACT EXTRACTION PHACO AND INTRAOCULAR LENS PLACEMENT (IOC) LEFT DIABETIC 5.51 01:10.7 (Left: Eye)     Patient location during evaluation: PACU Anesthesia Type: MAC Level of consciousness: awake and alert Pain management: pain level controlled Vital Signs Assessment: post-procedure vital signs reviewed and stable Respiratory status: spontaneous breathing Cardiovascular status: blood pressure returned to baseline Postop Assessment: no apparent nausea or vomiting, adequate PO intake and no headache Anesthetic complications: no   No notable events documented.  Adele Barthel Chukwudi Ewen

## 2021-06-04 NOTE — Transfer of Care (Signed)
Immediate Anesthesia Transfer of Care Note  Patient: Daniel Kirby  Procedure(s) Performed: CATARACT EXTRACTION PHACO AND INTRAOCULAR LENS PLACEMENT (IOC) LEFT DIABETIC 5.51 01:10.7 (Left: Eye)  Patient Location: PACU  Anesthesia Type: MAC  Level of Consciousness: awake, alert  and patient cooperative  Airway and Oxygen Therapy: Patient Spontanous Breathing and Patient connected to supplemental oxygen  Post-op Assessment: Post-op Vital signs reviewed, Patient's Cardiovascular Status Stable, Respiratory Function Stable, Patent Airway and No signs of Nausea or vomiting  Post-op Vital Signs: Reviewed and stable  Complications: No notable events documented.

## 2021-06-04 NOTE — Anesthesia Procedure Notes (Signed)
Procedure Name: MAC Date/Time: 06/04/2021 1:06 PM Performed by: Cameron Ali, CRNA Pre-anesthesia Checklist: Patient identified, Emergency Drugs available, Suction available, Timeout performed and Patient being monitored Patient Re-evaluated:Patient Re-evaluated prior to induction Oxygen Delivery Method: Nasal cannula Placement Confirmation: positive ETCO2

## 2021-06-05 ENCOUNTER — Encounter: Payer: Self-pay | Admitting: Ophthalmology

## 2021-06-05 MED ORDER — SIGHTPATH DOSE#1 BSS IO SOLN
INTRAOCULAR | Status: DC | PRN
  Administered 2021-06-04: 15 mL via INTRAOCULAR

## 2021-06-15 ENCOUNTER — Other Ambulatory Visit: Payer: Self-pay | Admitting: Family Medicine

## 2021-06-15 DIAGNOSIS — I10 Essential (primary) hypertension: Secondary | ICD-10-CM

## 2021-06-15 NOTE — Telephone Encounter (Signed)
Requested medication (s) are due for refill today: yes  Requested medication (s) are on the active medication list: yes  Last refill:  11/13/20 #90 1 RF  Future visit scheduled: yes  Notes to clinic:  overdue lab work   Requested Prescriptions  Pending Prescriptions Disp Refills   losartan-hydrochlorothiazide (HYZAAR) 50-12.5 MG tablet [Pharmacy Med Name: Losartan Potassium-HCTZ 50-12.5 MG Oral Tablet] 90 tablet 0    Sig: Take 1 tablet by mouth once daily      Cardiovascular: ARB + Diuretic Combos Failed - 06/15/2021  5:20 AM      Failed - K in normal range and within 180 days    Potassium  Date Value Ref Range Status  11/13/2020 4.5 3.5 - 5.2 mmol/L Final          Failed - Na in normal range and within 180 days    Sodium  Date Value Ref Range Status  11/13/2020 136 134 - 144 mmol/L Final          Failed - Cr in normal range and within 180 days    Creatinine, Ser  Date Value Ref Range Status  11/13/2020 1.01 0.76 - 1.27 mg/dL Final          Failed - Ca in normal range and within 180 days    Calcium  Date Value Ref Range Status  11/13/2020 9.9 8.6 - 10.2 mg/dL Final          Failed - Last BP in normal range    BP Readings from Last 1 Encounters:  06/04/21 (!) 142/82          Passed - Patient is not pregnant      Passed - Valid encounter within last 6 months    Recent Outpatient Visits           3 months ago Type 2 diabetes mellitus without complication, without long-term current use of insulin ([place])   [facility] Juline Patch, MD   6 months ago Acute maxillary sinusitis, recurrence not specified   [facility] Juline Patch, MD   7 months ago Type 2 diabetes mellitus without complication, without long-term current use of insulin ([place])   [facility] Juline Patch, MD   11 months ago Type 2 diabetes mellitus without complication, without long-term current use of insulin ([place])   [facility] Juline Patch, MD    1 year ago Type 2 diabetes mellitus without complication, without long-term current use of insulin ([place])   [facility] Juline Patch, MD       Future Appointments             In 3 weeks McGowan, Hunt Oris, PA-C [place]   In 1 month Juline Patch, MD [facility], [facility]

## 2021-06-30 ENCOUNTER — Other Ambulatory Visit: Payer: Self-pay | Admitting: Family Medicine

## 2021-06-30 DIAGNOSIS — E039 Hypothyroidism, unspecified: Secondary | ICD-10-CM

## 2021-06-30 NOTE — Telephone Encounter (Signed)
Requested medication (s) are due for refill today Yes  Requested medication (s) are on the active medication list Yes, by other name-Levothyroxine  Future visit scheduled Yes for 07/16/21.  TSH 11/13/21 WNL  Note to clinic-other label name on this request routing to pcp for this reason.   Requested Prescriptions  Pending Prescriptions Disp Refills   EUTHYROX 125 MCG tablet [Pharmacy Med Name: Euthyrox 125 MCG Oral Tablet] 30 tablet 0    Sig: Take 1 tablet by mouth once daily     Endocrinology:  Hypothyroid Agents Failed - 06/30/2021  3:57 PM      Failed - TSH needs to be rechecked within 3 months after an abnormal result. Refill until TSH is due.      Passed - TSH in normal range and within 360 days    TSH  Date Value Ref Range Status  11/13/2020 0.906 0.450 - 4.500 uIU/mL Final          Passed - Valid encounter within last 12 months    Recent Outpatient Visits           3 months ago Type 2 diabetes mellitus without complication, without long-term current use of insulin ([place])   [facility] Juline Patch, MD   7 months ago Acute maxillary sinusitis, recurrence not specified   [facility] Juline Patch, MD   7 months ago Type 2 diabetes mellitus without complication, without long-term current use of insulin ([place])   [facility] Juline Patch, MD   11 months ago Type 2 diabetes mellitus without complication, without long-term current use of insulin ([place])   [facility] Juline Patch, MD   1 year ago Type 2 diabetes mellitus without complication, without long-term current use of insulin ([place])   [facility] Juline Patch, MD       Future Appointments             In 1 week McGowan, Gordan Payment [place]   In 2 weeks Juline Patch, MD [facility], [facility]

## 2021-07-04 NOTE — Progress Notes (Deleted)
07/07/2021 3:19 PM   Daniel Kirby April 18, 1946 [id number]  Referring provider: Juline Patch, MD [address] [place],  [place] [address]  Urological history: 1. Prostate cancer -PSA pending -s/p RPP > 10 years ago  2. UI -contributing factors of age, prostate cancer, pelvic surgery, sugary drinks, diabetes, depression and anxiety -managed with oxybutynin IR 5 mg TID  3. ED -contributing factors af age, prostate cancer, pelvic surgery, diabetes, HLD, HTN, hypothyroidism and smoking history -managed with ICI  No chief complaint on file.   HPI: Daniel Kirby is a 75 y.o. male who presents today for a yearly visit.             Score:  1-7 Mild 8-19 Moderate 20-35 Severe   PMH: Past Medical History:  Diagnosis Date   Abdominal pain    RUQ   Arthritis    right ankle   ED (erectile dysfunction)    HLD (hyperlipidemia)    Hypertension    Hypothyroid    Prostate cancer ([place])    Vertigo    1 episode only, approx 1 month ago    Surgical History: Past Surgical History:  Procedure Laterality Date   CATARACT EXTRACTION W/PHACO Left 06/04/2021   Procedure: CATARACT EXTRACTION PHACO AND INTRAOCULAR LENS PLACEMENT (IOC) LEFT DIABETIC 5.51 01:10.7;  Surgeon: Leandrew Koyanagi, MD;  Location: [facility];  Service: Ophthalmology;  Laterality: Left;   COLONOSCOPY  2006   normal   COLONOSCOPY WITH PROPOFOL N/A 09/06/2015   Procedure: COLONOSCOPY WITH PROPOFOL;  Surgeon: Lucilla Lame, MD;  Location: [facility];  Service: Endoscopy;  Laterality: N/A;   POLYPECTOMY  09/06/2015   Procedure: POLYPECTOMY;  Surgeon: Lucilla Lame, MD;  Location: [facility];  Service: Endoscopy;;   PROSTATE SURGERY     robotic prostatectomy  2006    Home Medications:  Allergies as of 07/07/2021   No Known Allergies      Medication List        Accurate as of July 04, 2021  3:19 PM. If you have any questions, ask your nurse or doctor.           Euthyrox 125 MCG tablet Generic drug: levothyroxine Take 1 tablet by mouth once daily   glimepiride 2 MG tablet Commonly known as: AMARYL One a day   losartan-hydrochlorothiazide 50-12.5 MG tablet Commonly known as: HYZAAR Take 1 tablet by mouth once daily   metFORMIN 500 MG tablet Commonly known as: GLUCOPHAGE Take 1 tablet (500 mg total) by mouth 2 (two) times daily with a meal.   NONFORMULARY OR COMPOUNDED ITEM Trimix (30/1/10)-(Pap/Phent/PGE)  Dosage: Inject 0.5 cc per injection   Vial 86m  Qty #10 Refills 6  [phone] [phone]  oxybutynin 5 MG tablet Commonly known as: DITROPAN TAKE 1 TABLET BY MOUTH THREE TIMES DAILY   pravastatin 10 MG tablet Commonly known as: PRAVACHOL Take 1 tablet (10 mg total) by mouth daily.        Allergies: No Known Allergies  Family History: Family History  Problem Relation Age of Onset   Heart attack Father    Diabetes Mother    Kidney disease Brother     Social History:  reports that he has quit smoking. His smoking use included cigars. He has a 16.00 pack-year smoking history. His smokeless tobacco use includes snuff. He reports that he does not currently use alcohol. He reports that he does not use drugs.  ROS: For pertinent review  of systems please refer to history of present illness  Physical Exam: There were no vitals taken for this visit.  Constitutional:  Well nourished. Alert and oriented, No acute distress. HEENT: [place] AT, moist mucus membranes.  Trachea midline Cardiovascular: No clubbing, cyanosis, or edema. Respiratory: Normal respiratory effort, no increased work of breathing. GI: Abdomen is soft, non tender, non distended, no abdominal masses. Liver and spleen not palpable.  No hernias appreciated.  Stool sample for occult testing is not indicated.   GU: No CVA tenderness.  No bladder fullness or masses.  Patient with circumcised/uncircumcised phallus. ***Foreskin easily  retracted***  Urethral meatus is patent.  No penile discharge. No penile lesions or rashes. Scrotum without lesions, cysts, rashes and/or edema.  Testicles are located scrotally bilaterally. No masses are appreciated in the testicles. Left and right epididymis are normal. Rectal: Patient with  normal sphincter tone. Anus and perineum without scarring or rashes. No rectal masses are appreciated. Prostate is approximately *** grams, *** nodules are appreciated. Seminal vesicles are normal. Skin: No rashes, bruises or suspicious lesions. Lymph: No inguinal adenopathy. Neurologic: Grossly intact, no focal deficits, moving all 4 extremities. Psychiatric: Normal mood and affect.   Laboratory Data: Lab Results  Component Value Date   CREATININE 1.01 11/13/2020   Lab Results  Component Value Date   HGBA1C 5.5 03/14/2021  I have reviewed the labs.  Assessment & Plan:    1. Urge incontinence Continue  oxybutynin 5 mg tid BLADDER SCAN AMB NON-IMAGING RTC in one year for I PSS and PVR  2. Erectile dysfunction He would like to try the Trimix with the Lidocaine RTC in one year for exam  3. History of prostate cancer PSA pending  RTC in one year for PSA    No follow-ups on file.  Zara Council, PA-C  [facility] [address] [place], [place] [address] [phone]

## 2021-07-07 ENCOUNTER — Ambulatory Visit: Payer: Medicare Other | Admitting: Urology

## 2021-07-07 ENCOUNTER — Other Ambulatory Visit: Payer: Self-pay | Admitting: *Deleted

## 2021-07-07 DIAGNOSIS — Z8546 Personal history of malignant neoplasm of prostate: Secondary | ICD-10-CM

## 2021-07-07 DIAGNOSIS — N3941 Urge incontinence: Secondary | ICD-10-CM

## 2021-07-07 DIAGNOSIS — C61 Malignant neoplasm of prostate: Secondary | ICD-10-CM

## 2021-07-07 DIAGNOSIS — N5231 Erectile dysfunction following radical prostatectomy: Secondary | ICD-10-CM

## 2021-07-07 NOTE — Addendum Note (Signed)
Addended by: Despina Hidden on: 07/07/2021 08:38 AM   Modules accepted: Orders

## 2021-07-16 ENCOUNTER — Other Ambulatory Visit
Admission: RE | Admit: 2021-07-16 | Discharge: 2021-07-16 | Disposition: A | Discharge status: Home / Self Care | Payer: Medicare PPO | Attending: Urology | Admitting: Urology

## 2021-07-16 ENCOUNTER — Other Ambulatory Visit
Admission: RE | Admit: 2021-07-16 | Discharge: 2021-07-16 | Disposition: A | Discharge status: Home / Self Care | Payer: Medicare PPO | Source: Home / Self Care | Attending: Family Medicine | Admitting: Family Medicine

## 2021-07-16 ENCOUNTER — Other Ambulatory Visit: Payer: Self-pay

## 2021-07-16 ENCOUNTER — Ambulatory Visit (INDEPENDENT_AMBULATORY_CARE_PROVIDER_SITE_OTHER): Payer: Medicare PPO | Admitting: Family Medicine

## 2021-07-16 ENCOUNTER — Encounter: Payer: Self-pay | Admitting: Family Medicine

## 2021-07-16 VITALS — BP 100/58 | HR 72 | Ht 67.0 in | Wt 202.0 lb

## 2021-07-16 DIAGNOSIS — E785 Hyperlipidemia, unspecified: Secondary | ICD-10-CM | POA: Insufficient documentation

## 2021-07-16 DIAGNOSIS — E119 Type 2 diabetes mellitus without complications: Secondary | ICD-10-CM | POA: Insufficient documentation

## 2021-07-16 DIAGNOSIS — I10 Essential (primary) hypertension: Secondary | ICD-10-CM | POA: Diagnosis not present

## 2021-07-16 DIAGNOSIS — E782 Mixed hyperlipidemia: Secondary | ICD-10-CM

## 2021-07-16 DIAGNOSIS — E039 Hypothyroidism, unspecified: Secondary | ICD-10-CM | POA: Diagnosis not present

## 2021-07-16 DIAGNOSIS — Z8546 Personal history of malignant neoplasm of prostate: Secondary | ICD-10-CM | POA: Diagnosis not present

## 2021-07-16 LAB — RENAL FUNCTION PANEL
Albumin: 4.2 g/dL (ref 3.5–5.0)
Anion gap: 7 (ref 5–15)
BUN: 13 mg/dL (ref 8–23)
CO2: 28 mmol/L (ref 22–32)
Calcium: 9.8 mg/dL (ref 8.9–10.3)
Chloride: 99 mmol/L (ref 98–111)
Creatinine, Ser: 0.87 mg/dL (ref 0.61–1.24)
GFR, Estimated: >60 mL/min (ref >60)
Glucose, Bld: 137 mg/dL — ABNORMAL HIGH (ref 70–99)
Phosphorus: 3.4 mg/dL (ref 2.5–4.6)
Potassium: 3.8 mmol/L (ref 3.5–5.1)
Sodium: 134 mmol/L — ABNORMAL LOW (ref 135–145)

## 2021-07-16 LAB — LIPID PANEL
Cholesterol: 139 mg/dL (ref 0–200)
HDL: 31 mg/dL — ABNORMAL LOW (ref >40)
LDL Cholesterol: 78 mg/dL (ref 0–99)
Total CHOL/HDL Ratio: 4.5 RATIO
Triglycerides: 150 mg/dL — ABNORMAL HIGH (ref <150)
VLDL: 30 mg/dL (ref 0–40)

## 2021-07-16 LAB — TSH: TSH: 2.758 u[IU]/mL (ref 0.350–4.500)

## 2021-07-16 LAB — HEMOGLOBIN A1C
Hgb A1c MFr Bld: 5.7 % — ABNORMAL HIGH (ref 4.8–5.6)
Mean Plasma Glucose: 116.89 mg/dL

## 2021-07-16 LAB — T4, FREE: Free T4: 0.88 ng/dL (ref 0.61–1.12)

## 2021-07-16 LAB — PSA: Prostatic Specific Antigen: <0.01 ng/mL (ref 0.00–4.00)

## 2021-07-16 MED ORDER — LOSARTAN POTASSIUM-HCTZ 50-12.5 MG PO TABS: 1.0000 | ORAL_TABLET | Freq: Every day | ORAL | 1 refills | Status: DC

## 2021-07-16 MED ORDER — PRAVASTATIN SODIUM 10 MG PO TABS: 10.0000 mg | ORAL_TABLET | Freq: Every day | ORAL | 1 refills | Status: DC

## 2021-07-16 MED ORDER — GLIMEPIRIDE 2 MG PO TABS: ORAL_TABLET | ORAL | 1 refills | Status: DC

## 2021-07-16 MED ORDER — LEVOTHYROXINE SODIUM 125 MCG PO TABS: 125.0000 ug | ORAL_TABLET | Freq: Every day | ORAL | 5 refills | Status: DC

## 2021-07-16 MED ORDER — METFORMIN HCL 500 MG PO TABS: 500.0000 mg | ORAL_TABLET | Freq: Two times a day (BID) | ORAL | 1 refills | Status: DC

## 2021-07-16 NOTE — Progress Notes (Signed)
Date:  07/16/2021   Name:  Daniel Kirby   DOB:  1946-10-17   MRN:  [id number]   Chief Complaint: Hyperlipidemia, Hypertension, Hypothyroidism, and Diabetes  Hyperlipidemia This is a chronic problem. The current episode started more than 1 year ago. The problem is controlled. Recent lipid tests were reviewed and are normal. Exacerbating diseases include diabetes and hypothyroidism. He has no history of chronic renal disease. Factors aggravating his hyperlipidemia include thiazides. Pertinent negatives include no chest pain, focal weakness, leg pain, myalgias or shortness of breath. Current antihyperlipidemic treatment includes statins and diet change. The current treatment provides moderate improvement of lipids. Compliance problems include adherence to exercise.  Risk factors for coronary artery disease include diabetes mellitus, hypertension, dyslipidemia and male sex.  Hypertension This is a chronic problem. The current episode started more than 1 year ago. The problem has been gradually improving since onset. The problem is controlled. Pertinent negatives include no anxiety, blurred vision, chest pain, headaches, malaise/fatigue, neck pain, orthopnea, palpitations, peripheral edema, PND, shortness of breath or sweats. Risk factors for coronary artery disease include male gender, diabetes mellitus, sedentary lifestyle and smoking/tobacco exposure. Past treatments include diuretics and angiotensin blockers. The current treatment provides moderate improvement. There is no history of angina, kidney disease, CAD/MI, CVA, heart failure, left ventricular hypertrophy, PVD or retinopathy. Identifiable causes of hypertension include a thyroid problem. There is no history of chronic renal disease, a hypertension causing med or renovascular disease.  Diabetes He presents for his follow-up diabetic visit. He has type 2 diabetes mellitus. No MedicAlert identification noted. His disease course has been stable.  Pertinent negatives for hypoglycemia include no confusion, dizziness, headaches, hunger, mood changes, nervousness/anxiousness, seizures, sleepiness, speech difficulty, sweats or tremors. Associated symptoms include weight loss. Pertinent negatives for diabetes include no blurred vision, no chest pain, no fatigue, no foot paresthesias, no foot ulcerations, no polydipsia, no polyphagia, no polyuria, no visual change and no weakness. Pertinent negatives for hypoglycemia complications include no blackouts, no hospitalization and no nocturnal hypoglycemia. Symptoms are stable. Pertinent negatives for diabetic complications include no CVA, PVD or retinopathy. Current diabetic treatment includes oral agent (dual therapy). He is compliant with treatment all of the time. His weight is stable. He is following a low salt, low fat/cholesterol and generally healthy diet. He has not had a previous visit with a dietitian. (Not checking BS) An ACE inhibitor/angiotensin II receptor blocker is being taken. He does not see a podiatrist.Eye exam is current (cataract).  Thyroid Problem Presents for follow-up visit. Symptoms include weight loss. Patient reports no anxiety, constipation, depressed mood, diaphoresis, diarrhea, dry skin, fatigue, hair loss, palpitations, tremors or visual change. The symptoms have been stable. His past medical history is significant for diabetes and hyperlipidemia. There is no history of heart failure.   Lab Results  Component Value Date   CREATININE 1.01 11/13/2020   BUN 9 11/13/2020   NA 136 11/13/2020   K 4.5 11/13/2020   CL 97 11/13/2020   CO2 22 11/13/2020   Lab Results  Component Value Date   CHOL 140 11/13/2020   HDL 32 (L) 11/13/2020   LDLCALC 80 11/13/2020   TRIG 158 (H) 11/13/2020   CHOLHDL 4.2 12/08/2016   Lab Results  Component Value Date   TSH 0.906 11/13/2020   Lab Results  Component Value Date   HGBA1C 5.5 03/14/2021   Lab Results  Component Value Date   WBC  6.1 12/19/2018   HGB 14.5 12/19/2018   HCT  41.0 12/19/2018   MCV 86.5 12/19/2018   PLT 168 12/19/2018   Lab Results  Component Value Date   ALT 27 11/13/2020   AST 12 11/13/2020   ALKPHOS 82 11/13/2020   BILITOT 0.3 11/13/2020     Review of Systems  Constitutional:  Positive for weight loss. Negative for chills, diaphoresis, fatigue, fever and malaise/fatigue.  HENT:  Negative for drooling, ear discharge, ear pain and sore throat.   Eyes:  Negative for blurred vision.  Respiratory:  Negative for cough, shortness of breath and wheezing.   Cardiovascular:  Negative for chest pain, palpitations, orthopnea, leg swelling and PND.  Gastrointestinal:  Negative for abdominal pain, blood in stool, constipation, diarrhea and nausea.  Endocrine: Negative for polydipsia, polyphagia and polyuria.  Genitourinary:  Negative for dysuria, frequency, hematuria and urgency.  Musculoskeletal:  Negative for back pain, myalgias and neck pain.  Skin:  Negative for rash.  Allergic/Immunologic: Negative for environmental allergies.  Neurological:  Negative for dizziness, tremors, focal weakness, seizures, speech difficulty, weakness and headaches.  Hematological:  Does not bruise/bleed easily.  Psychiatric/Behavioral:  Negative for confusion and suicidal ideas. The patient is not nervous/anxious.    Patient Active Problem List   Diagnosis Date Noted   Type 2 diabetes mellitus without complication, without long-term current use of insulin ([place]) 01/18/2018   Chronic low back pain without sciatica 06/08/2017   Essential hypertension 12/08/2016   Adult hypothyroidism 12/08/2016   Arthritis 12/08/2016   Acute anxiety 12/08/2016   Cervical radiculopathy 12/08/2016   Mixed hyperlipidemia 12/08/2016   Special screening for malignant neoplasms, colon    Benign neoplasm of ascending colon    Major depressive disorder with single episode, in partial remission ([place]) 08/20/2015   Erectile dysfunction  06/04/2015   History of prostate cancer 06/04/2015    No Known Allergies  Past Surgical History:  Procedure Laterality Date   CATARACT EXTRACTION W/PHACO Left 06/04/2021   Procedure: CATARACT EXTRACTION PHACO AND INTRAOCULAR LENS PLACEMENT (IOC) LEFT DIABETIC 5.51 01:10.7;  Surgeon: Leandrew Koyanagi, MD;  Location: [facility];  Service: Ophthalmology;  Laterality: Left;   COLONOSCOPY  2006   normal   COLONOSCOPY WITH PROPOFOL N/A 09/06/2015   Procedure: COLONOSCOPY WITH PROPOFOL;  Surgeon: Lucilla Lame, MD;  Location: [facility];  Service: Endoscopy;  Laterality: N/A;   POLYPECTOMY  09/06/2015   Procedure: POLYPECTOMY;  Surgeon: Lucilla Lame, MD;  Location: [facility];  Service: Endoscopy;;   PROSTATE SURGERY     robotic prostatectomy  2006    Social History   Tobacco Use   Smoking status: Former    Packs/day: 1.00    Years: 16.00    Pack years: 16.00    Types: Cigars, Cigarettes   Smokeless tobacco: Current    Types: Snuff   Tobacco comments:    Quit 2007. Smoking cessationmaterials not required  Vaping Use   Vaping Use: Never used  Substance Use Topics   Alcohol use: Not Currently    Alcohol/week: 0.0 standard drinks    Comment: occasional, 1-2 drinks per year   Drug use: No     Medication list has been reviewed and updated.  Current Meds  Medication Sig   EUTHYROX 125 MCG tablet Take 1 tablet by mouth once daily   glimepiride (AMARYL) 2 MG tablet One a day   losartan-hydrochlorothiazide (HYZAAR) 50-12.5 MG tablet Take 1 tablet by mouth once daily   metFORMIN (GLUCOPHAGE) 500 MG tablet Take 1 tablet (500 mg total) by mouth 2 (  two) times daily with a meal.   NONFORMULARY OR COMPOUNDED ITEM Trimix (30/1/10)-(Pap/Phent/PGE)  Dosage: Inject 0.5 cc per injection   Vial 51m  Qty #10 Refills 6  [phone] [phone]  oxybutynin (DITROPAN) 5 MG tablet TAKE 1 TABLET BY MOUTH THREE TIMES DAILY    pravastatin (PRAVACHOL) 10 MG tablet Take 1 tablet (10 mg total) by mouth daily.    PHQ 2/9 Scores 07/16/2021 03/14/2021 11/13/2020 11/04/2020  PHQ - 2 Score 0 0 0 1  PHQ- 9 Score 0 2 0 2    GAD 7 : Generalized Anxiety Score 07/16/2021 03/14/2021 11/13/2020 07/10/2020  Nervous, Anxious, on Edge 0 0 0 0  Control/stop worrying 0 0 0 0  Worry too much - different things 0 0 0 0  Trouble relaxing 0 0 0 0  Restless 0 0 0 0  Easily annoyed or irritable 0 0 0 0  Afraid - awful might happen 0 0 0 0  Total GAD 7 Score 0 0 0 0    BP Readings from Last 3 Encounters:  06/04/21 (!) 142/82  03/14/21 (!) 138/100  11/25/20 120/70    Physical Exam Vitals and nursing note reviewed.  HENT:     Head: Normocephalic.     Right Ear: Tympanic membrane, ear canal and external ear normal. There is no impacted cerumen.     Left Ear: Tympanic membrane, ear canal and external ear normal. There is no impacted cerumen.     Nose: Nose normal. No congestion or rhinorrhea.  Eyes:     General: No scleral icterus.       Right eye: No discharge.        Left eye: No discharge.     Conjunctiva/sclera: Conjunctivae normal.     Pupils: Pupils are equal, round, and reactive to light.  Neck:     Thyroid: No thyromegaly.     Vascular: No carotid bruit or JVD.     Trachea: No tracheal deviation.  Cardiovascular:     Rate and Rhythm: Normal rate and regular rhythm.     Heart sounds: Normal heart sounds. No murmur heard.   No friction rub. No gallop.  Pulmonary:     Effort: No respiratory distress.     Breath sounds: Normal breath sounds. No wheezing, rhonchi or rales.  Abdominal:     General: Bowel sounds are normal.     Palpations: Abdomen is soft. There is no mass.     Tenderness: There is no abdominal tenderness. There is no guarding or rebound.  Musculoskeletal:        General: No tenderness. Normal range of motion.     Cervical back: Normal range of motion and neck supple.  Lymphadenopathy:     Cervical:  No cervical adenopathy.  Skin:    General: Skin is warm.     Findings: No rash.  Neurological:     Mental Status: He is alert and oriented to person, place, and time.     Cranial Nerves: No cranial nerve deficit.     Deep Tendon Reflexes: Reflexes are normal and symmetric.    Wt Readings from Last 3 Encounters:  07/16/21 202 lb (91.6 kg)  06/04/21 214 lb (97.1 kg)  03/14/21 212 lb (96.2 kg)    Ht '5\' 7"'$  (1.702 m)   Wt 202 lb (91.6 kg)   BMI 31.64 kg/m   Assessment and Plan:  1. Type 2 diabetes mellitus without complication, without long-term current use of insulin (  HCC) Chronic.  Controlled.  Stable.  Continue glimepiride 2 mg once a day and metformin 500 mg 1 twice a day.  Will check renal function panel and hemoglobin A1c. - glimepiride (AMARYL) 2 MG tablet; One a day  Dispense: 90 tablet; Refill: 1 - metFORMIN (GLUCOPHAGE) 500 MG tablet; Take 1 tablet (500 mg total) by mouth 2 (two) times daily with a meal.  Dispense: 180 tablet; Refill: 1 - HgB A1c - Renal Function Panel  2. Adult hypothyroidism .  Controlled.  Stable.  Will check thyroid panel with TSH and pending values will likely continue levothyroxine 125 mcg daily. - levothyroxine (EUTHYROX) 125 MCG tablet; Take 1 tablet (125 mcg total) by mouth daily.  Dispense: 30 tablet; Refill: 5 - Thyroid Panel With TSH  3. Essential hypertension Chronic.  Controlled.  Stable.  Blood pressure is 100/58 and patient is asymptomatic with no dizziness.  We will continue losartan hydrochlorothiazide 50-12.5 mg daily.  Will check renal function panel for GFR and electrolytes. - losartan-hydrochlorothiazide (HYZAAR) 50-12.5 MG tablet; Take 1 tablet by mouth daily.  Dispense: 90 tablet; Refill: 1 - Renal Function Panel  4. Mixed hyperlipidemia Chronic.  Controlled.  Stable.  Continue pravastatin 10 mg once a day.  Will check lipid panel for current triglyceride/LDL. - pravastatin (PRAVACHOL) 10 MG tablet; Take 1 tablet (10 mg total) by  mouth daily.  Dispense: 90 tablet; Refill: 1 - Lipid Panel With LDL/HDL Ratio

## 2021-07-17 ENCOUNTER — Ambulatory Visit (INDEPENDENT_AMBULATORY_CARE_PROVIDER_SITE_OTHER): Payer: Medicare PPO | Admitting: Urology

## 2021-07-17 ENCOUNTER — Encounter: Payer: Self-pay | Admitting: Urology

## 2021-07-17 VITALS — BP 107/73 | HR 108 | Ht 67.0 in | Wt 202.0 lb

## 2021-07-17 DIAGNOSIS — N3941 Urge incontinence: Secondary | ICD-10-CM

## 2021-07-17 LAB — BLADDER SCAN AMB NON-IMAGING: Scan Result: 0

## 2021-07-17 LAB — T3, FREE: T3, Free: 2.9 pg/mL (ref 2.0–4.4)

## 2021-07-17 MED ORDER — NONFORMULARY OR COMPOUNDED ITEM: 6 refills | Status: DC

## 2021-07-17 MED ORDER — OXYBUTYNIN CHLORIDE 5 MG PO TABS: 5.0000 mg | ORAL_TABLET | Freq: Three times a day (TID) | ORAL | 0 refills | Status: DC

## 2021-07-17 NOTE — Progress Notes (Signed)
07/17/2021 3:09 PM   Daniel Kirby [birth year] [id number]  Referring provider: Juline Patch, MD [address] [place],  [place] [address]  Urological history: 1. Prostate cancer -PSA <0.01 in 06/2021 -s/p RPP > 10 years ago  2. UI -I PSS 2/0  -contributing factors of age, prostate cancer, pelvic surgery, sugary drinks, diabetes, depression and anxiety -managed with oxybutynin IR 5 mg TID  3. ED -contributing factors af age, prostate cancer, pelvic surgery, diabetes, HLD, HTN, hypothyroidism and smoking history -managed with ICI  Chief Complaint  Patient presents with   Urinary Incontinence     HPI: Daniel Kirby is a 75 y.o. male who presents today for a yearly visit.      He has no urinary complaints at this visit.  He is at goal for oxybutynin IR  5 mg TID.  Patient denies any modifying or aggravating factors.  Patient denies any gross hematuria, dysuria or suprapubic/flank pain.  Patient denies any fevers, chills, nausea or vomiting.     PVR 0 mL   Has not injected recently, but he has had not issues with the ICI in the past.     [place] Name 07/17/21 1500         International Prostate Symptom Score   How often have you had the sensation of not emptying your bladder? Not at All     How often have you had to urinate less than every two hours? Not at All     How often have you found you stopped and started again several times when you urinated? Not at All     How often have you found it difficult to postpone urination? Less than 1 in 5 times     How often have you had a weak urinary stream? Not at All     How often have you had to strain to start urination? Not at All     How many times did you typically get up at night to urinate? 1 Time     Total IPSS Score 2           Quality of Life due to urinary symptoms   If you were to spend the rest of your life with your urinary condition just the way it is now how would you feel about that?  Delighted               Score:  1-7 Mild 8-19 Moderate 20-35 Severe   PMH: Past Medical History:  Diagnosis Date   Abdominal pain    RUQ   Arthritis    right ankle   ED (erectile dysfunction)    HLD (hyperlipidemia)    Hypertension    Hypothyroid    Prostate cancer ([place])    Vertigo    1 episode only, approx 1 month ago    Surgical History: Past Surgical History:  Procedure Laterality Date   CATARACT EXTRACTION W/PHACO Left 06/04/2021   Procedure: CATARACT EXTRACTION PHACO AND INTRAOCULAR LENS PLACEMENT (IOC) LEFT DIABETIC 5.51 01:10.7;  Surgeon: Leandrew Koyanagi, MD;  Location: [facility];  Service: Ophthalmology;  Laterality: Left;   COLONOSCOPY  2006   normal   COLONOSCOPY WITH PROPOFOL N/A 09/06/2015   Procedure: COLONOSCOPY WITH PROPOFOL;  Surgeon: Lucilla Lame, MD;  Location: [facility];  Service: Endoscopy;  Laterality: N/A;   POLYPECTOMY  09/06/2015   Procedure: POLYPECTOMY;  Surgeon: Lucilla Lame, MD;  Location: [facility];  Service: Endoscopy;;   PROSTATE SURGERY     robotic prostatectomy  2006    Home Medications:  Allergies as of 07/17/2021   No Known Allergies      Medication List        Accurate as of July 17, 2021  3:09 PM. If you have any questions, ask your nurse or doctor.          glimepiride 2 MG tablet Commonly known as: AMARYL One a day   levothyroxine 125 MCG tablet Commonly known as: Euthyrox Take 1 tablet (125 mcg total) by mouth daily.   losartan-hydrochlorothiazide 50-12.5 MG tablet Commonly known as: HYZAAR Take 1 tablet by mouth daily.   metFORMIN 500 MG tablet Commonly known as: GLUCOPHAGE Take 1 tablet (500 mg total) by mouth 2 (two) times daily with a meal.   NONFORMULARY OR COMPOUNDED ITEM Trimix (30/1/10)-(Pap/Phent/PGE)  Dosage: Inject 0.5 cc per injection   Vial 70m  Qty #10 Refills 6  [phone] [phone]  oxybutynin 5 MG  tablet Commonly known as: DITROPAN TAKE 1 TABLET BY MOUTH THREE TIMES DAILY   pravastatin 10 MG tablet Commonly known as: PRAVACHOL Take 1 tablet (10 mg total) by mouth daily.        Allergies: No Known Allergies  Family History: Family History  Problem Relation Age of Onset   Heart attack Father    Diabetes Mother    Kidney disease Brother     Social History:  reports that he has quit smoking. His smoking use included cigars and cigarettes. He has a 16.00 pack-year smoking history. His smokeless tobacco use includes snuff. He reports that he does not currently use alcohol. He reports that he does not use drugs.  ROS: For pertinent review of systems please refer to history of present illness  Physical Exam: BP 107/73   Pulse (!) 108   Ht '5\' 7"'$  (1.702 m)   Wt 202 lb (91.6 kg)   BMI 31.64 kg/m   Constitutional:  Well nourished. Alert and oriented, No acute distress. HEENT: [place] AT, mask in place.  Trachea midline Cardiovascular: No clubbing, cyanosis, or edema. Respiratory: Normal respiratory effort, no increased work of breathing. Neurologic: Grossly intact, no focal deficits, moving all 4 extremities. Psychiatric: Normal mood and affect.   Laboratory Data: Lab Results  Component Value Date   CREATININE 0.87 07/16/2021   Lab Results  Component Value Date   HGBA1C 5.7 (H) 07/16/2021  I have reviewed the labs.  Pertinent Imaging Results for Daniel Kirby, Daniel Kirby(MRN [id number] as of 07/17/2021 15:19  Ref. Range 07/17/2021 15:00  Scan Result Unknown 0    Assessment & Plan:    1. Urge incontinence -minimal bother -Continue  oxybutynin 5 mg tid-refills given -BLADDER SCAN AMB NON-IMAGING  2. Erectile dysfunction -in a new relationship -continue Trimix 30/1/10, 5 mcg -refills given  3. History of prostate cancer -PSA remains undetectable    Return in about 1 year (around 07/17/2022) for IPSS, PSA and exam.  SZara Council PProliance Surgeons Inc Ps BDestin[address] [place] [phone]

## 2021-11-05 ENCOUNTER — Ambulatory Visit (INDEPENDENT_AMBULATORY_CARE_PROVIDER_SITE_OTHER): Payer: Medicare PPO

## 2021-11-05 DIAGNOSIS — Z0000 Encounter for general adult medical examination without abnormal findings: Secondary | ICD-10-CM

## 2021-11-05 DIAGNOSIS — Z789 Other specified health status: Secondary | ICD-10-CM

## 2021-11-05 NOTE — Progress Notes (Signed)
Subjective:   Daniel Kirby is a 75 y.o. male who presents for Medicare Annual/Subsequent preventive examination.  Virtual Visit via Telephone Note  I connected with  Daniel Kirby on 11/05/21 at  1:20 PM EST by telephone and verified that I am speaking with the correct person using two identifiers.  Location: Patient: home Provider: [name] Persons participating in the virtual visit: [place]   I discussed the limitations, risks, security and privacy concerns of performing an evaluation and management service by telephone and the availability of in person appointments. The patient expressed understanding and agreed to proceed.  Interactive audio and video telecommunications were attempted between this nurse and patient, however failed, due to patient having technical difficulties OR patient did not have access to video capability.  We continued and completed visit with audio only.  Some vital signs may be absent or patient reported.   [name], LPN   Review of Systems     Cardiac Risk Factors include: advanced age (>6men, >18 women);diabetes mellitus;dyslipidemia;hypertension;male gender;obesity (BMI >30kg/m2);smoking/ tobacco exposure     Objective:    There were no vitals filed for this visit. There is no height or weight on file to calculate BMI.  Advanced Directives 11/05/2021 06/04/2021 11/04/2020 11/10/2017 09/06/2015 07/24/2015  Does Patient Have a Medical Advance Directive? No No No No No No  Does patient want to make changes to medical advance directive? - No - Patient declined - - - -  Copy of [name] in Chart? - No - copy requested - - - -  Would patient like information on creating a medical advance directive? Yes (MAU/Ambulatory/Procedural Areas - Information given) - Yes (MAU/Ambulatory/Procedural Areas - Information given) Yes (MAU/Ambulatory/Procedural Areas - Information given) No - patient declined information No -  patient declined information    Current Medications (verified) Outpatient Encounter Medications as of 11/05/2021  Medication Sig   glimepiride (AMARYL) 2 MG tablet One a day   levothyroxine (EUTHYROX) 125 MCG tablet Take 1 tablet (125 mcg total) by mouth daily.   losartan-hydrochlorothiazide (HYZAAR) 50-12.5 MG tablet Take 1 tablet by mouth daily.   metFORMIN (GLUCOPHAGE) 500 MG tablet Take 1 tablet (500 mg total) by mouth 2 (two) times daily with a meal.   NONFORMULARY OR COMPOUNDED ITEM Trimix (30/1/10)-(Pap/Phent/PGE)  Dosage: Inject 0.5 cc per injection   Vial 70ml  Qty #10 Refills 6  [facility] [phone] Fax [phone]   oxybutynin (DITROPAN) 5 MG tablet Take 1 tablet (5 mg total) by mouth 3 (three) times daily.   pravastatin (PRAVACHOL) 10 MG tablet Take 1 tablet (10 mg total) by mouth daily.   No facility-administered encounter medications on file as of 11/05/2021.    Allergies (verified) Patient has no known allergies.   History: Past Medical History:  Diagnosis Date   Abdominal pain    RUQ   Arthritis    right ankle   ED (erectile dysfunction)    HLD (hyperlipidemia)    Hypertension    Hypothyroid    Prostate cancer ([place])    Vertigo    1 episode only, approx 1 month ago   Past Surgical History:  Procedure Laterality Date   CATARACT EXTRACTION W/PHACO Left 06/04/2021   Procedure: CATARACT EXTRACTION PHACO AND INTRAOCULAR LENS PLACEMENT (IOC) LEFT DIABETIC 5.51 01:10.7;  Surgeon: Leandrew Koyanagi, MD;  Location: [place];  Service: Ophthalmology;  Laterality: Left;   COLONOSCOPY  2006   normal   COLONOSCOPY WITH PROPOFOL N/A 09/06/2015  Procedure: COLONOSCOPY WITH PROPOFOL;  Surgeon: Lucilla Lame, MD;  Location: [place];  Service: Endoscopy;  Laterality: N/A;   POLYPECTOMY  09/06/2015   Procedure: POLYPECTOMY;  Surgeon: Lucilla Lame, MD;  Location: [place];  Service: Endoscopy;;   PROSTATE SURGERY      robotic prostatectomy  2006   Family History  Problem Relation Age of Onset   Heart attack Father    Diabetes Mother    Kidney disease Brother    Social History   Socioeconomic History   Marital status: Legally Separated    Spouse name: Not on file   Number of children: 3   Years of education: Not on file   Highest education level: 12th grade  Occupational History   Occupation: Retired  Tobacco Use   Smoking status: Former    Packs/day: 1.00    Years: 16.00    Pack years: 16.00    Types: Cigars, Cigarettes   Smokeless tobacco: Current    Types: Snuff   Tobacco comments:    Quit 2007. Smoking cessation materials not required  Vaping Use   Vaping Use: Never used  Substance and Sexual Activity   Alcohol use: Not Currently    Alcohol/week: 0.0 standard drinks    Comment: occasional, 1-2 drinks per year   Drug use: No   Sexual activity: Yes  Other Topics Concern   Not on file  Social History Narrative   Lives alone.    Social Determinants of Health   Financial Resource Strain: Low Risk    Difficulty of Paying Living Expenses: Not hard at all  Food Insecurity: No Food Insecurity   Worried About Charity fundraiser in the Last Year: Never true   [place] in the Last Year: Never true  Transportation Needs: No Transportation Needs   Lack of Transportation (Medical): No   Lack of Transportation (Non-Medical): No  Physical Activity: Inactive   Days of Exercise per Week: 0 days   Minutes of Exercise per Session: 0 min  Stress: No Stress Concern Present   Feeling of Stress : Not at all  Social Connections: Moderately Isolated   Frequency of Communication with Friends and Family: More than three times a week   Frequency of Social Gatherings with Friends and Family: Once a week   Attends Religious Services: Never   Marine scientist or Organizations: Yes   Attends Music therapist: More than 4 times per year   Marital Status: Separated     Tobacco Counseling Ready to quit: Not Answered Counseling given: Not Answered Tobacco comments: Quit 2007. Smoking cessation materials not required   Clinical Intake:  Pre-visit preparation completed: Yes  Pain : No/denies pain     Nutritional Risks: None Diabetes: Yes CBG done?: No Did pt. bring in CBG monitor from home?: No  How often do you need to have someone help you when you read instructions, pamphlets, or other written materials from your doctor or pharmacy?: 1 - Never  Nutrition Risk Assessment:  Has the patient had any N/V/D within the last 2 months?  Yes  - diarrhea related to something he ate Does the patient have any non-healing wounds?  No  Has the patient had any unintentional weight loss or weight gain?  No   Diabetes:  Is the patient diabetic?  Yes  If diabetic, was a CBG obtained today?  No  Did the patient bring in their glucometer from home?  No  How  often do you monitor your CBG's? Occasionally per patient.   Financial Strains and Diabetes Management:  Are you having any financial strains with the device, your supplies or your medication? No .  Does the patient want to be seen by Chronic Care Management for management of their diabetes?  No  Would the patient like to be referred to a Nutritionist or for Diabetic Management?  No   Diabetic Exams:  Diabetic Eye Exam: Completed 10/08/20. Overdue for diabetic eye exam. Pt has been advised about the importance in completing this exam.   Diabetic Foot Exam: Completed 07/10/20. Pt has been advised about the importance in completing this exam. Pt is scheduled for diabetic foot exam on 11/19/21.    Interpreter Needed?: No  Information entered by :: [name] LPN   Activities of Daily Living In your present state of health, do you have any difficulty performing the following activities: 11/05/2021 06/04/2021  Hearing? N N  Vision? N N  Difficulty concentrating or making decisions? N N  Walking  or climbing stairs? N N  Dressing or bathing? N N  Doing errands, shopping? N -  Preparing Food and eating ? N -  Using the Toilet? N -  In the past six months, have you accidently leaked urine? N -  Do you have problems with loss of bowel control? N -  Managing your Medications? N -  Managing your Finances? N -  Housekeeping or managing your Housekeeping? N -  Some recent data might be hidden    Patient Care Team: Juline Patch, MD as PCP - General (Family Medicine) Nori Riis, PA-C as Physician Assistant (Urology)  Indicate any recent Medical Services you may have received from other than [facility] providers in the past year (date may be approximate).     Assessment:   This is a routine wellness examination for [place].  Hearing/Vision screen Hearing Screening - Comments:: Pt denies hearing difficulty Vision Screening - Comments:: Annual vision screenings done at [facility]  Dietary issues and exercise activities discussed: Current Exercise Habits: The patient does not participate in regular exercise at present, Exercise limited by: None identified   Goals Addressed             This Visit's Progress    Exercise 150 min/wk Moderate Activity   Not on track    Recommend to exercise at least 150 minutes per week       Depression Screen PHQ 2/9 Scores 11/05/2021 07/16/2021 03/14/2021 11/13/2020 11/04/2020 07/10/2020 03/20/2020  PHQ - 2 Score 0 0 0 0 1 0 0  PHQ- 9 Score - 0 2 0 2 2 1     Fall Risk Fall Risk  11/05/2021 07/16/2021 11/13/2020 11/04/2020 07/10/2020  Falls in the past year? 0 0 0 0 0  Comment - - - - -  Number falls in past yr: 0 0 - 0 -  Injury with Fall? 0 0 - 0 -  Risk for fall due to : No Fall Risks Impaired balance/gait Impaired balance/gait No Fall Risks -  Follow up Falls prevention discussed Falls evaluation completed Falls evaluation completed Falls prevention discussed Falls evaluation completed    [place]:  Any stairs in or around the home? Yes  If so, are there any without handrails? Yes - outside steps Home free of loose throw rugs in walkways, pet beds, electrical cords, etc? Yes  Adequate lighting in your home to reduce risk of falls?  Yes   ASSISTIVE DEVICES UTILIZED TO PREVENT FALLS:  Life alert? No  Use of a cane, walker or w/c? No  Grab bars in the bathroom? Yes  Shower chair or bench in shower? Yes  Elevated toilet seat or a handicapped toilet? No   TIMED UP AND GO:  Was the test performed? No . Telephonic visit.   Cognitive Function: Normal cognitive status assessed by direct observation by this Nurse Health Advisor. No abnormalities found.       6CIT Screen 11/10/2017  What Year? 0 points  What month? 0 points  What time? 3 points  Count back from 20 0 points  Months in reverse 0 points  Repeat phrase 4 points  Total Score 7    Immunizations Immunization History  Administered Date(s) Administered   Moderna Sars-Covid-2 Vaccination 12/29/2019, 01/29/2020   Pneumococcal Conjugate-13 08/20/2015   Pneumococcal Polysaccharide-23 09/25/2013   Tdap 09/25/2013    TDAP status: Up to date  Flu Vaccine status: Declined, Education has been provided regarding the importance of this vaccine but patient still declined. Advised may receive this vaccine at local pharmacy or Health Dept. Aware to provide a copy of the vaccination record if obtained from local pharmacy or Health Dept. Verbalized acceptance and understanding.  Pneumococcal vaccine status: Up to date  Covid-19 vaccine status: Completed vaccines  Qualifies for Shingles Vaccine? Yes   Zostavax completed No   Shingrix Completed?: No.    Education has been provided regarding the importance of this vaccine. Patient has been advised to call insurance company to determine out of pocket expense if they have not yet received this vaccine. Advised may also receive vaccine at local pharmacy or Health Dept.  Verbalized acceptance and understanding.  Screening Tests Health Maintenance  Topic Date Due   Zoster Vaccines- Shingrix (1 of 2) Never done   COVID-19 Vaccine (3 - Booster for Moderna series) 03/25/2020   INFLUENZA VACCINE  Never done   FOOT EXAM  07/10/2021   OPHTHALMOLOGY EXAM  09/16/2021   HEMOGLOBIN A1C  01/13/2022   TETANUS/TDAP  09/26/2023   COLONOSCOPY (Pts 45-58yrs Insurance coverage will need to be confirmed)  09/05/2025   Pneumonia Vaccine 38+ Years old  Completed   Hepatitis C Screening  Completed   HPV VACCINES  Aged Out    Health Maintenance  Health Maintenance Due  Topic Date Due   Zoster Vaccines- Shingrix (1 of 2) Never done   COVID-19 Vaccine (3 - Booster for Moderna series) 03/25/2020   INFLUENZA VACCINE  Never done   FOOT EXAM  07/10/2021   OPHTHALMOLOGY EXAM  09/16/2021    Colorectal cancer screening: Type of screening: Colonoscopy. Completed 09/06/15. Repeat every 10 years  Lung Cancer Screening: (Low Dose CT Chest recommended if Age 30-80 years, 30 pack-year currently smoking OR have quit w/in 15years.) does not qualify.   Additional Screening:  Hepatitis C Screening: does qualify; Completed 12/08/16  Vision Screening: Recommended annual ophthalmology exams for early detection of glaucoma and other disorders of the eye. Is the patient up to date with their annual eye exam?  Yes  Who is the provider or what is the name of the office in which the patient attends annual eye exams? [facility] .   Dental Screening: Recommended annual dental exams for proper oral hygiene  Community Resource Referral / Chronic Care Management: CRR required this visit?  Yes - handrails for porch  CCM required this visit?  No      Plan:  I have personally reviewed and noted the following in the patients chart:   Medical and social history Use of alcohol, tobacco or illicit drugs  Current medications and supplements including opioid prescriptions.  Patient is not currently taking opioid prescriptions. Functional ability and status Nutritional status Physical activity Advanced directives List of other physicians Hospitalizations, surgeries, and ER visits in previous 12 months Vitals Screenings to include cognitive, depression, and falls Referrals and appointments  In addition, I have reviewed and discussed with patient certain preventive protocols, quality metrics, and best practice recommendations. A written personalized care plan for preventive services as well as general preventive health recommendations were provided to patient.     [name], LPN   [id number]   Nurse Notes: none

## 2021-11-05 NOTE — Patient Instructions (Signed)
[name] , Thank you for taking time to come for your Medicare Wellness Visit. I appreciate your ongoing commitment to your health goals. Please review the following plan we discussed and let me know if I can assist you in the future.   Screening recommendations/referrals: Colonoscopy: done 09/06/15 Recommended yearly ophthalmology/optometry visit for glaucoma screening and checkup Recommended yearly dental visit for hygiene and checkup  Vaccinations: Influenza vaccine: declined Pneumococcal vaccine: done 08/20/15 Tdap vaccine: done 09/25/13 Shingles vaccine: Shingrix discussed. Please contact your pharmacy for coverage information.  Covid-19: done 12/29/19 & 01/29/20  Advanced directives: Advance directive discussed with you today. I have provided a copy for you to complete at home and have notarized. Once this is complete please bring a copy in to our office so we can scan it into your chart.   Conditions/risks identified: Recommend increasing physical activity   Next appointment: Follow up in one year for your annual wellness visit.   Preventive Care 75 Years and Older, Male Preventive care refers to lifestyle choices and visits with your health care provider that can promote health and wellness. What does preventive care include? A yearly physical exam. This is also called an annual well check. Dental exams once or twice a year. Routine eye exams. Ask your health care provider how often you should have your eyes checked. Personal lifestyle choices, including: Daily care of your teeth and gums. Regular physical activity. Eating a healthy diet. Avoiding tobacco and drug use. Limiting alcohol use. Practicing safe sex. Taking low doses of aspirin every day. Taking vitamin and mineral supplements as recommended by your health care provider. What happens during an annual well check? The services and screenings done by your health care provider during your annual well check will  depend on your age, overall health, lifestyle risk factors, and family history of disease. Counseling  Your health care provider may ask you questions about your: Alcohol use. Tobacco use. Drug use. Emotional well-being. Home and relationship well-being. Sexual activity. Eating habits. History of falls. Memory and ability to understand (cognition). Work and work Statistician. Screening  You may have the following tests or measurements: Height, weight, and BMI. Blood pressure. Lipid and cholesterol levels. These may be checked every 5 years, or more frequently if you are over 63 years old. Skin check. Lung cancer screening. You may have this screening every year starting at age 67 if you have a 30-pack-year history of smoking and currently smoke or have quit within the past 15 years. Fecal occult blood test (FOBT) of the stool. You may have this test every year starting at age 47. Flexible sigmoidoscopy or colonoscopy. You may have a sigmoidoscopy every 5 years or a colonoscopy every 10 years starting at age 10. Prostate cancer screening. Recommendations will vary depending on your family history and other risks. Hepatitis C blood test. Hepatitis B blood test. Sexually transmitted disease (STD) testing. Diabetes screening. This is done by checking your blood sugar (glucose) after you have not eaten for a while (fasting). You may have this done every 1-3 years. Abdominal aortic aneurysm (AAA) screening. You may need this if you are a current or former smoker. Osteoporosis. You may be screened starting at age 40 if you are at high risk. Talk with your health care provider about your test results, treatment options, and if necessary, the need for more tests. Vaccines  Your health care provider may recommend certain vaccines, such as: Influenza vaccine. This is recommended every year. Tetanus, diphtheria, and acellular  pertussis (Tdap, Td) vaccine. You may need a Td booster every 10  years. Zoster vaccine. You may need this after age 42. Pneumococcal 13-valent conjugate (PCV13) vaccine. One dose is recommended after age 86. Pneumococcal polysaccharide (PPSV23) vaccine. One dose is recommended after age 81. Talk to your health care provider about which screenings and vaccines you need and how often you need them. This information is not intended to replace advice given to you by your health care provider. Make sure you discuss any questions you have with your health care provider. Document Released: 11/29/2015 Document Revised: 07/22/2016 Document Reviewed: 09/03/2015 Elsevier Interactive Patient Education  2017 [employer] Prevention in the Home Falls can cause injuries. They can happen to people of all ages. There are many things you can do to make your home safe and to help prevent falls. What can I do on the outside of my home? Regularly fix the edges of walkways and driveways and fix any cracks. Remove anything that might make you trip as you walk through a door, such as a raised step or threshold. Trim any bushes or trees on the path to your home. Use bright outdoor lighting. Clear any walking paths of anything that might make someone trip, such as rocks or tools. Regularly check to see if handrails are loose or broken. Make sure that both sides of any steps have handrails. Any raised decks and porches should have guardrails on the edges. Have any leaves, snow, or ice cleared regularly. Use sand or salt on walking paths during winter. Clean up any spills in your garage right away. This includes oil or grease spills. What can I do in the bathroom? Use night lights. Install grab bars by the toilet and in the tub and shower. Do not use towel bars as grab bars. Use non-skid mats or decals in the tub or shower. If you need to sit down in the shower, use a plastic, non-slip stool. Keep the floor dry. Clean up any water that spills on the floor as soon as it  happens. Remove soap buildup in the tub or shower regularly. Attach bath mats securely with double-sided non-slip rug tape. Do not have throw rugs and other things on the floor that can make you trip. What can I do in the bedroom? Use night lights. Make sure that you have a light by your bed that is easy to reach. Do not use any sheets or blankets that are too big for your bed. They should not hang down onto the floor. Have a firm chair that has side arms. You can use this for support while you get dressed. Do not have throw rugs and other things on the floor that can make you trip. What can I do in the kitchen? Clean up any spills right away. Avoid walking on wet floors. Keep items that you use a lot in easy-to-reach places. If you need to reach something above you, use a strong step stool that has a grab bar. Keep electrical cords out of the way. Do not use floor polish or wax that makes floors slippery. If you must use wax, use non-skid floor wax. Do not have throw rugs and other things on the floor that can make you trip. What can I do with my stairs? Do not leave any items on the stairs. Make sure that there are handrails on both sides of the stairs and use them. Fix handrails that are broken or loose. Make sure that handrails  are as long as the stairways. Check any carpeting to make sure that it is firmly attached to the stairs. Fix any carpet that is loose or worn. Avoid having throw rugs at the top or bottom of the stairs. If you do have throw rugs, attach them to the floor with carpet tape. Make sure that you have a light switch at the top of the stairs and the bottom of the stairs. If you do not have them, ask someone to add them for you. What else can I do to help prevent falls? Wear shoes that: Do not have high heels. Have rubber bottoms. Are comfortable and fit you well. Are closed at the toe. Do not wear sandals. If you use a stepladder: Make sure that it is fully opened.  Do not climb a closed stepladder. Make sure that both sides of the stepladder are locked into place. Ask someone to hold it for you, if possible. Clearly mark and make sure that you can see: Any grab bars or handrails. First and last steps. Where the edge of each step is. Use tools that help you move around (mobility aids) if they are needed. These include: Canes. Walkers. Scooters. Crutches. Turn on the lights when you go into a dark area. Replace any light bulbs as soon as they burn out. Set up your furniture so you have a clear path. Avoid moving your furniture around. If any of your floors are uneven, fix them. If there are any pets around you, be aware of where they are. Review your medicines with your doctor. Some medicines can make you feel dizzy. This can increase your chance of falling. Ask your doctor what other things that you can do to help prevent falls. This information is not intended to replace advice given to you by your health care provider. Make sure you discuss any questions you have with your health care provider. Document Released: 08/29/2009 Document Revised: 04/09/2016 Document Reviewed: 12/07/2014 Elsevier Interactive Patient Education  2017 [employer].

## 2021-11-19 ENCOUNTER — Ambulatory Visit: Payer: Medicare PPO | Admitting: Family Medicine

## 2021-11-27 ENCOUNTER — Telehealth: Payer: Self-pay

## 2021-11-27 NOTE — Telephone Encounter (Signed)
° °  Telephone encounter was:  Successful.  11/27/2021 Name: Daniel Kirby MRN: [id number] DOB: 1946-10-23  Daniel Kirby is a 76 y.o. year old male who is a primary care patient of Juline Patch, MD . The community resource team was consulted for assistance with Home Modifications  Care guide performed the following interventions: Spoke with patient he is not feeling well and requested that I call him Monday afternoon 12/01/21.  Follow Up Plan:  Care guide will follow up with patient by phone over the next 7 days.  Daniel Kirby, AAS Paralegal, [facility]  [address], [place] [address] ??[email or web address]   ?? [phone]   [email or web address]

## 2021-12-03 ENCOUNTER — Telehealth: Payer: Self-pay

## 2021-12-03 NOTE — Telephone Encounter (Signed)
° °  Telephone encounter was:  Unsuccessful.  12/03/2021 Name: ANOTHONY BURSCH MRN: [id number] DOB: 28-Oct-1946  Unsuccessful outbound call made today to assist with:   rails for front steps  Outreach Attempt:  2nd Attempt  A HIPAA compliant voice message was left requesting a return call.  Instructed patient to call back at [phone].  Charmion Hapke, AAS Paralegal, [facility]  [address], [place] [address] ??[email or web address]   ?? [phone]   [email or web address]

## 2021-12-04 ENCOUNTER — Ambulatory Visit: Payer: Medicare PPO | Admitting: Family Medicine

## 2021-12-09 ENCOUNTER — Encounter: Payer: Self-pay | Admitting: Family Medicine

## 2021-12-09 ENCOUNTER — Ambulatory Visit (INDEPENDENT_AMBULATORY_CARE_PROVIDER_SITE_OTHER): Payer: Medicare PPO | Admitting: Family Medicine

## 2021-12-09 ENCOUNTER — Other Ambulatory Visit: Payer: Self-pay

## 2021-12-09 ENCOUNTER — Other Ambulatory Visit: Payer: Self-pay | Admitting: Urology

## 2021-12-09 VITALS — BP 130/70 | HR 76 | Ht 67.0 in | Wt 220.0 lb

## 2021-12-09 DIAGNOSIS — E782 Mixed hyperlipidemia: Secondary | ICD-10-CM

## 2021-12-09 DIAGNOSIS — E119 Type 2 diabetes mellitus without complications: Secondary | ICD-10-CM | POA: Diagnosis not present

## 2021-12-09 DIAGNOSIS — E039 Hypothyroidism, unspecified: Secondary | ICD-10-CM

## 2021-12-09 DIAGNOSIS — I10 Essential (primary) hypertension: Secondary | ICD-10-CM

## 2021-12-09 DIAGNOSIS — N3941 Urge incontinence: Secondary | ICD-10-CM

## 2021-12-09 MED ORDER — LOSARTAN POTASSIUM-HCTZ 50-12.5 MG PO TABS: 1.0000 | ORAL_TABLET | Freq: Every day | ORAL | 1 refills | Status: DC

## 2021-12-09 MED ORDER — LEVOTHYROXINE SODIUM 125 MCG PO TABS: 125.0000 ug | ORAL_TABLET | Freq: Every day | ORAL | 5 refills | Status: DC

## 2021-12-09 MED ORDER — PRAVASTATIN SODIUM 10 MG PO TABS: 10.0000 mg | ORAL_TABLET | Freq: Every day | ORAL | 1 refills | Status: DC

## 2021-12-09 MED ORDER — OXYBUTYNIN CHLORIDE 5 MG PO TABS: 5.0000 mg | ORAL_TABLET | Freq: Three times a day (TID) | ORAL | 0 refills | Status: DC

## 2021-12-09 MED ORDER — GLIMEPIRIDE 2 MG PO TABS: ORAL_TABLET | ORAL | 1 refills | Status: DC

## 2021-12-09 MED ORDER — METFORMIN HCL 500 MG PO TABS: 500.0000 mg | ORAL_TABLET | Freq: Two times a day (BID) | ORAL | 1 refills | Status: DC

## 2021-12-09 NOTE — Progress Notes (Signed)
Date:  12/09/2021   Name:  Daniel Kirby   DOB:  [birth year]   MRN:  [id number]   Chief Complaint: Diabetes, Hypertension, Hyperlipidemia, and Hypothyroidism  Diabetes He presents for his follow-up diabetic visit. He has type 2 diabetes mellitus. His disease course has been stable. There are no hypoglycemic associated symptoms. Pertinent negatives for hypoglycemia include no dizziness, headaches, nervousness/anxiousness or sweats. There are no diabetic associated symptoms. Pertinent negatives for diabetes include no blurred vision, no chest pain and no polydipsia. There are no hypoglycemic complications. Symptoms are stable. There are no diabetic complications. Pertinent negatives for diabetic complications include no autonomic neuropathy, CVA, heart disease, impotence, nephropathy, peripheral neuropathy, PVD or retinopathy. There are no known risk factors for coronary artery disease. Current diabetic treatment includes oral agent (dual therapy). He is compliant with treatment all of the time. He is following a generally healthy diet. Meal planning includes avoidance of concentrated sweets and carbohydrate counting. He participates in exercise intermittently. An ACE inhibitor/angiotensin II receptor blocker is being taken.  Hypertension This is a chronic problem. The current episode started more than 1 year ago. The problem has been gradually improving since onset. The problem is controlled. Pertinent negatives include no anxiety, blurred vision, chest pain, headaches, malaise/fatigue, neck pain, orthopnea, palpitations, peripheral edema, PND, shortness of breath or sweats. There are no associated agents to hypertension. Risk factors for coronary artery disease include dyslipidemia. Past treatments include angiotensin blockers and diuretics. The current treatment provides moderate improvement. There are no compliance problems.  There is no history of angina, kidney disease, CAD/MI, CVA, heart failure,  left ventricular hypertrophy, PVD or retinopathy. There is no history of chronic renal disease, a hypertension causing med or renovascular disease.  Hyperlipidemia This is a chronic problem. The current episode started more than 1 year ago. The problem is controlled. Recent lipid tests were reviewed and are normal. He has no history of chronic renal disease or diabetes. Pertinent negatives include no chest pain, focal sensory loss, focal weakness, leg pain, myalgias or shortness of breath. Current antihyperlipidemic treatment includes statins. The current treatment provides moderate improvement of lipids. There are no compliance problems.  Risk factors for coronary artery disease include hypertension, diabetes mellitus and dyslipidemia.   Lab Results  Component Value Date   NA 134 (L) 07/16/2021   K 3.8 07/16/2021   CO2 28 07/16/2021   GLUCOSE 137 (H) 07/16/2021   BUN 13 07/16/2021   CREATININE 0.87 07/16/2021   CALCIUM 9.8 07/16/2021   GFRNONAA >60 07/16/2021   Lab Results  Component Value Date   CHOL 139 07/16/2021   HDL 31 (L) 07/16/2021   LDLCALC 78 07/16/2021   TRIG 150 (H) 07/16/2021   CHOLHDL 4.5 07/16/2021   Lab Results  Component Value Date   TSH 2.758 07/16/2021   Lab Results  Component Value Date   HGBA1C 5.7 (H) 07/16/2021   Lab Results  Component Value Date   WBC 6.1 12/19/2018   HGB 14.5 12/19/2018   HCT 41.0 12/19/2018   MCV 86.5 12/19/2018   PLT 168 12/19/2018   Lab Results  Component Value Date   ALT 27 11/13/2020   AST 12 11/13/2020   ALKPHOS 82 11/13/2020   BILITOT 0.3 11/13/2020   No results found for: 25OHVITD2, 25OHVITD3, VD25OH   Review of Systems  Constitutional:  Negative for chills, fever and malaise/fatigue.  HENT:  Negative for drooling, ear discharge, ear pain and sore throat.   Eyes:  Negative for  blurred vision.  Respiratory:  Negative for cough, shortness of breath and wheezing.   Cardiovascular:  Negative for chest pain,  palpitations, orthopnea, leg swelling and PND.  Gastrointestinal:  Negative for abdominal pain, blood in stool, constipation, diarrhea and nausea.  Endocrine: Negative for polydipsia.  Genitourinary:  Negative for dysuria, frequency, hematuria, impotence and urgency.  Musculoskeletal:  Negative for back pain, myalgias and neck pain.  Skin:  Negative for rash.  Allergic/Immunologic: Negative for environmental allergies.  Neurological:  Negative for dizziness, focal weakness and headaches.  Hematological:  Does not bruise/bleed easily.  Psychiatric/Behavioral:  Negative for suicidal ideas. The patient is not nervous/anxious.    Patient Active Problem List   Diagnosis Date Noted   Type 2 diabetes mellitus without complication, without long-term current use of insulin ([facility]) 01/18/2018   Chronic low back pain without sciatica 06/08/2017   Essential hypertension 12/08/2016   Adult hypothyroidism 12/08/2016   Arthritis 12/08/2016   Acute anxiety 12/08/2016   Cervical radiculopathy 12/08/2016   Mixed hyperlipidemia 12/08/2016   Special screening for malignant neoplasms, colon    Benign neoplasm of ascending colon    Major depressive disorder with single episode, in partial remission ([facility]) 08/20/2015   Erectile dysfunction 06/04/2015   History of prostate cancer 06/04/2015    No Known Allergies  Past Surgical History:  Procedure Laterality Date   CATARACT EXTRACTION W/PHACO Left 06/04/2021   Procedure: CATARACT EXTRACTION PHACO AND INTRAOCULAR LENS PLACEMENT (IOC) LEFT DIABETIC 5.51 01:10.7;  Surgeon: Leandrew Koyanagi, MD;  Location: [facility];  Service: Ophthalmology;  Laterality: Left;   COLONOSCOPY  2006   normal   COLONOSCOPY WITH PROPOFOL N/A 09/06/2015   Procedure: COLONOSCOPY WITH PROPOFOL;  Surgeon: Lucilla Lame, MD;  Location: [facility];  Service: Endoscopy;  Laterality: N/A;   POLYPECTOMY  09/06/2015   Procedure: POLYPECTOMY;  Surgeon: Lucilla Lame, MD;   Location: [facility];  Service: Endoscopy;;   PROSTATE SURGERY     robotic prostatectomy  2006    Social History   Tobacco Use   Smoking status: Former    Packs/day: 1.00    Years: 16.00    Pack years: 16.00    Types: Cigars, Cigarettes   Smokeless tobacco: Current    Types: Snuff   Tobacco comments:    Quit 2007. Smoking cessation materials not required  Vaping Use   Vaping Use: Never used  Substance Use Topics   Alcohol use: Not Currently    Alcohol/week: 0.0 standard drinks    Comment: occasional, 1-2 drinks per year   Drug use: No     Medication list has been reviewed and updated.  Current Meds  Medication Sig   glimepiride (AMARYL) 2 MG tablet One a day   levothyroxine (EUTHYROX) 125 MCG tablet Take 1 tablet (125 mcg total) by mouth daily.   losartan-hydrochlorothiazide (HYZAAR) 50-12.5 MG tablet Take 1 tablet by mouth daily.   metFORMIN (GLUCOPHAGE) 500 MG tablet Take 1 tablet (500 mg total) by mouth 2 (two) times daily with a meal.   NONFORMULARY OR COMPOUNDED ITEM Trimix (30/1/10)-(Pap/Phent/PGE)  Dosage: Inject 0.5 cc per injection   Vial 50ml  Qty #10 Refills 6  [place] [phone] Fax [phone]   pravastatin (PRAVACHOL) 10 MG tablet Take 1 tablet (10 mg total) by mouth daily.   [DISCONTINUED] oxybutynin (DITROPAN) 5 MG tablet Take 1 tablet (5 mg total) by mouth 3 (three) times daily.    PHQ 2/9 Scores 11/05/2021 07/16/2021 03/14/2021 11/13/2020  PHQ -  2 Score 0 0 0 0  PHQ- 9 Score - 0 2 0    GAD 7 : Generalized Anxiety Score 07/16/2021 03/14/2021 11/13/2020 07/10/2020  Nervous, Anxious, on Edge 0 0 0 0  Control/stop worrying 0 0 0 0  Worry too much - different things 0 0 0 0  Trouble relaxing 0 0 0 0  Restless 0 0 0 0  Easily annoyed or irritable 0 0 0 0  Afraid - awful might happen 0 0 0 0  Total GAD 7 Score 0 0 0 0    BP Readings from Last 3 Encounters:  12/09/21 130/70  07/17/21 107/73  07/16/21 (!) 100/58     Physical Exam Vitals and nursing note reviewed.  HENT:     Head: Normocephalic.     Right Ear: Tympanic membrane, ear canal and external ear normal. There is no impacted cerumen.     Left Ear: Tympanic membrane, ear canal and external ear normal. There is no impacted cerumen.     Nose: Nose normal. No congestion or rhinorrhea.     Mouth/Throat:     Mouth: Mucous membranes are moist.     Pharynx: Oropharynx is clear.  Eyes:     General: No scleral icterus.       Right eye: No discharge.        Left eye: No discharge.     Conjunctiva/sclera: Conjunctivae normal.     Pupils: Pupils are equal, round, and reactive to light.  Neck:     Thyroid: No thyromegaly.     Vascular: No JVD.     Trachea: No tracheal deviation.  Cardiovascular:     Rate and Rhythm: Normal rate and regular rhythm.     Heart sounds: Normal heart sounds. No murmur heard.   No friction rub. No gallop.  Pulmonary:     Effort: No respiratory distress.     Breath sounds: Normal breath sounds. No wheezing, rhonchi or rales.  Abdominal:     General: Bowel sounds are normal.     Palpations: Abdomen is soft. There is no mass.     Tenderness: There is no abdominal tenderness. There is no right CVA tenderness, left CVA tenderness, guarding or rebound.  Musculoskeletal:        General: No tenderness. Normal range of motion.     Cervical back: Normal range of motion and neck supple.  Lymphadenopathy:     Cervical: No cervical adenopathy.  Skin:    General: Skin is warm.     Findings: No rash.  Neurological:     Mental Status: He is alert and oriented to person, place, and time.     Cranial Nerves: No cranial nerve deficit.     Deep Tendon Reflexes: Reflexes are normal and symmetric.    Wt Readings from Last 3 Encounters:  12/09/21 220 lb (99.8 kg)  07/17/21 202 lb (91.6 kg)  07/16/21 202 lb (91.6 kg)    BP 130/70    Pulse 76    Ht 5\' 7"  (1.702 m)    Wt 220 lb (99.8 kg)    BMI 34.46 kg/m   Assessment and  Plan:  1. Type 2 diabetes mellitus without complication, without long-term current use of insulin (HCC) Chronic.  Controlled.  Stable.  Continue glimepiride 2 mg once a day and metformin 500 mg 1 twice a day.  We will evaluate current status with A1c, microalbuminuria, and comprehensive metabolic panel. - glimepiride (AMARYL) 2 MG tablet; One a day  Dispense: 90 tablet; Refill: 1 - metFORMIN (GLUCOPHAGE) 500 MG tablet; Take 1 tablet (500 mg total) by mouth 2 (two) times daily with a meal.  Dispense: 180 tablet; Refill: 1 - HgB A1c - Microalbumin, urine - Comprehensive Metabolic Panel (CMET)  2. Adult hypothyroidism Chronic.  Controlled.  Stable.  Upon evaluation TSH we are likely to continue on current dosing of supplementation of levothyroxine 125 mcg once a day. - levothyroxine (EUTHYROX) 125 MCG tablet; Take 1 tablet (125 mcg total) by mouth daily.  Dispense: 30 tablet; Refill: 5 - TSH  3. Essential hypertension Chronic.  Controlled.  Stable.  Current blood pressure is 130/70.  Continue losartan hydrochlorothiazide 50-12.5 mg once a day.  Will check CMP for electrolytes and GFR. - losartan-hydrochlorothiazide (HYZAAR) 50-12.5 MG tablet; Take 1 tablet by mouth daily.  Dispense: 90 tablet; Refill: 1 - Comprehensive Metabolic Panel (CMET)  4. Mixed hyperlipidemia Chronic.  Controlled.  Stable.  Continue pravastatin 10 mg once a day.  Current status of lipid panel is acceptable at this time - pravastatin (PRAVACHOL) 10 MG tablet; Take 1 tablet (10 mg total) by mouth daily.  Dispense: 90 tablet; Refill: 1

## 2021-12-10 LAB — COMPREHENSIVE METABOLIC PANEL
ALT: 13 IU/L (ref 0–44)
AST: 13 IU/L (ref 0–40)
Albumin/Globulin Ratio: 1.4 (ref 1.2–2.2)
Albumin: 4.5 g/dL (ref 3.7–4.7)
Alkaline Phosphatase: 76 IU/L (ref 44–121)
BUN/Creatinine Ratio: 9 — ABNORMAL LOW (ref 10–24)
BUN: 9 mg/dL (ref 8–27)
Bilirubin Total: 0.7 mg/dL (ref 0.0–1.2)
CO2: 24 mmol/L (ref 20–29)
Calcium: 10.2 mg/dL (ref 8.6–10.2)
Chloride: 95 mmol/L — ABNORMAL LOW (ref 96–106)
Creatinine, Ser: 0.99 mg/dL (ref 0.76–1.27)
Globulin, Total: 3.2 g/dL (ref 1.5–4.5)
Glucose: 146 mg/dL — ABNORMAL HIGH (ref 70–99)
Potassium: 3.8 mmol/L (ref 3.5–5.2)
Sodium: 135 mmol/L (ref 134–144)
Total Protein: 7.7 g/dL (ref 6.0–8.5)
eGFR: 79 mL/min/{1.73_m2} (ref >59)

## 2021-12-10 LAB — TSH: TSH: 3.78 u[IU]/mL (ref 0.450–4.500)

## 2021-12-10 LAB — MICROALBUMIN, URINE: Microalbumin, Urine: 10.7 ug/mL

## 2021-12-10 LAB — HEMOGLOBIN A1C
Est. average glucose Bld gHb Est-mCnc: 146 mg/dL
Hgb A1c MFr Bld: 6.7 % — ABNORMAL HIGH (ref 4.8–5.6)

## 2021-12-11 ENCOUNTER — Telehealth: Payer: Self-pay

## 2021-12-11 NOTE — Telephone Encounter (Signed)
° °  Telephone encounter was:  Unsuccessful.  12/11/2021 Name: NOWELL SITES MRN: [id number] DOB: 27-Aug-1946  Unsuccessful outbound call made today to assist with:  hand rails for front steps  Outreach Attempt:  3rd Attempt.  Referral closed unable to contact patient.  A HIPAA compliant voice message was left requesting a return call.  Instructed patient to call back at [phone].  Parisha Beaulac, AAS Paralegal, [facility]  [address], [place] [address] ??[email or web address]   ?? [phone]   [email or web address]

## 2021-12-16 ENCOUNTER — Other Ambulatory Visit: Payer: Self-pay | Admitting: Urology

## 2021-12-16 DIAGNOSIS — N3941 Urge incontinence: Secondary | ICD-10-CM

## 2022-02-11 ENCOUNTER — Telehealth: Payer: Self-pay | Admitting: Urology

## 2022-02-11 NOTE — Telephone Encounter (Signed)
Pt LMOM that he went to pharmacy in [place] to get a refill on meds and pharmacy told him we said do not fill prescription.  Can you call pt please? ?

## 2022-02-12 ENCOUNTER — Other Ambulatory Visit: Payer: Self-pay | Admitting: Urology

## 2022-02-12 DIAGNOSIS — N5231 Erectile dysfunction following radical prostatectomy: Secondary | ICD-10-CM

## 2022-02-12 MED ORDER — NONFORMULARY OR COMPOUNDED ITEM: 6 refills | Status: DC

## 2022-02-12 NOTE — Telephone Encounter (Signed)
Pt referring to Trimix ?

## 2022-02-12 NOTE — Telephone Encounter (Signed)
Spoke with patient and advised results   

## 2022-04-09 ENCOUNTER — Encounter: Payer: Self-pay | Admitting: Family Medicine

## 2022-04-09 ENCOUNTER — Ambulatory Visit (INDEPENDENT_AMBULATORY_CARE_PROVIDER_SITE_OTHER): Payer: Medicare PPO | Admitting: Family Medicine

## 2022-04-09 VITALS — BP 136/60 | HR 88 | Ht 67.0 in | Wt 212.0 lb

## 2022-04-09 DIAGNOSIS — E119 Type 2 diabetes mellitus without complications: Secondary | ICD-10-CM

## 2022-04-09 MED ORDER — GLIMEPIRIDE 2 MG PO TABS: ORAL_TABLET | ORAL | 1 refills | Status: DC

## 2022-04-09 MED ORDER — METFORMIN HCL 500 MG PO TABS: 500.0000 mg | ORAL_TABLET | Freq: Two times a day (BID) | ORAL | 1 refills | Status: DC

## 2022-04-09 NOTE — Progress Notes (Signed)
Date:  04/09/2022   Name:  Daniel Kirby   DOB:  [birth year]   MRN:  [id number]   Chief Complaint: Diabetes  Diabetes He presents for his follow-up diabetic visit. He has type 2 diabetes mellitus. His disease course has been stable. There are no hypoglycemic associated symptoms. Pertinent negatives for hypoglycemia include no dizziness, headaches or nervousness/anxiousness. Pertinent negatives for diabetes include no blurred vision, no chest pain, no fatigue, no foot paresthesias, no foot ulcerations, no polydipsia, no polyphagia, no polyuria, no visual change, no weakness and no weight loss. There are no hypoglycemic complications. Symptoms are stable. There are no diabetic complications. There are no known risk factors for coronary artery disease. Current diabetic treatment includes oral agent (dual therapy). He is compliant with treatment all of the time. He is following a generally healthy diet. Meal planning includes avoidance of concentrated sweets and carbohydrate counting. He participates in exercise daily. An ACE inhibitor/angiotensin II receptor blocker is being taken.   Lab Results  Component Value Date   NA 135 12/09/2021   K 3.8 12/09/2021   CO2 24 12/09/2021   GLUCOSE 146 (H) 12/09/2021   BUN 9 12/09/2021   CREATININE 0.99 12/09/2021   CALCIUM 10.2 12/09/2021   EGFR 79 12/09/2021   GFRNONAA >60 07/16/2021   Lab Results  Component Value Date   CHOL 139 07/16/2021   HDL 31 (L) 07/16/2021   LDLCALC 78 07/16/2021   TRIG 150 (H) 07/16/2021   CHOLHDL 4.5 07/16/2021   Lab Results  Component Value Date   TSH 3.780 12/09/2021   Lab Results  Component Value Date   HGBA1C 6.7 (H) 12/09/2021   Lab Results  Component Value Date   WBC 6.1 12/19/2018   HGB 14.5 12/19/2018   HCT 41.0 12/19/2018   MCV 86.5 12/19/2018   PLT 168 12/19/2018   Lab Results  Component Value Date   ALT 13 12/09/2021   AST 13 12/09/2021   ALKPHOS 76 12/09/2021   BILITOT 0.7 12/09/2021   No  results found for: 25OHVITD2, 25OHVITD3, VD25OH   Review of Systems  Constitutional:  Negative for chills, fatigue, fever and weight loss.  HENT:  Negative for drooling, ear discharge, ear pain and sore throat.   Eyes:  Negative for blurred vision.  Respiratory:  Negative for cough, shortness of breath and wheezing.   Cardiovascular:  Negative for chest pain, palpitations and leg swelling.  Gastrointestinal:  Negative for abdominal pain, blood in stool, constipation, diarrhea and nausea.  Endocrine: Negative for polydipsia, polyphagia and polyuria.  Genitourinary:  Negative for dysuria, frequency, hematuria and urgency.  Musculoskeletal:  Negative for back pain, myalgias and neck pain.  Skin:  Negative for rash.  Allergic/Immunologic: Negative for environmental allergies.  Neurological:  Negative for dizziness, weakness and headaches.  Hematological:  Does not bruise/bleed easily.  Psychiatric/Behavioral:  Negative for suicidal ideas. The patient is not nervous/anxious.    Patient Active Problem List   Diagnosis Date Noted   Type 2 diabetes mellitus without complication, without long-term current use of insulin ([facility]) 01/18/2018   Chronic low back pain without sciatica 06/08/2017   Essential hypertension 12/08/2016   Adult hypothyroidism 12/08/2016   Arthritis 12/08/2016   Acute anxiety 12/08/2016   Cervical radiculopathy 12/08/2016   Mixed hyperlipidemia 12/08/2016   Special screening for malignant neoplasms, colon    Benign neoplasm of ascending colon    Major depressive disorder with single episode, in partial remission ([facility]) 08/20/2015   Erectile dysfunction 06/04/2015  History of prostate cancer 06/04/2015    No Known Allergies  Past Surgical History:  Procedure Laterality Date   CATARACT EXTRACTION W/PHACO Left 06/04/2021   Procedure: CATARACT EXTRACTION PHACO AND INTRAOCULAR LENS PLACEMENT (IOC) LEFT DIABETIC 5.51 01:10.7;  Surgeon: Leandrew Koyanagi, MD;  Location:  [facility];  Service: Ophthalmology;  Laterality: Left;   COLONOSCOPY  2006   normal   COLONOSCOPY WITH PROPOFOL N/A 09/06/2015   Procedure: COLONOSCOPY WITH PROPOFOL;  Surgeon: Lucilla Lame, MD;  Location: [facility];  Service: Endoscopy;  Laterality: N/A;   POLYPECTOMY  09/06/2015   Procedure: POLYPECTOMY;  Surgeon: Lucilla Lame, MD;  Location: [facility];  Service: Endoscopy;;   PROSTATE SURGERY     robotic prostatectomy  2006    Social History   Tobacco Use   Smoking status: Former    Packs/day: 1.00    Years: 16.00    Pack years: 16.00    Types: Cigars, Cigarettes   Smokeless tobacco: Current    Types: Snuff   Tobacco comments:    Quit 2007. Smoking cessation materials not required  Vaping Use   Vaping Use: Never used  Substance Use Topics   Alcohol use: Not Currently    Alcohol/week: 0.0 standard drinks    Comment: occasional, 1-2 drinks per year   Drug use: No     Medication list has been reviewed and updated.  Current Meds  Medication Sig   levothyroxine (EUTHYROX) 125 MCG tablet Take 1 tablet (125 mcg total) by mouth daily.   losartan-hydrochlorothiazide (HYZAAR) 50-12.5 MG tablet Take 1 tablet by mouth daily.   NONFORMULARY OR COMPOUNDED ITEM Trimix (30/1/10)-(Pap/Phent/PGE)  Dosage: Inject 0.5 cc per injection   Vial 56m  Qty #10 Refills 6  [phone] [phone]  oxybutynin (DITROPAN) 5 MG tablet Take 1 tablet (5 mg total) by mouth 3 (three) times daily.   pravastatin (PRAVACHOL) 10 MG tablet Take 1 tablet (10 mg total) by mouth daily.   [DISCONTINUED] glimepiride (AMARYL) 2 MG tablet One a day   [DISCONTINUED] metFORMIN (GLUCOPHAGE) 500 MG tablet Take 1 tablet (500 mg total) by mouth 2 (two) times daily with a meal.       04/09/2022    1:31 PM 07/16/2021    9:05 AM 03/14/2021    9:39 AM 11/13/2020    9:33 AM  GAD 7 : Generalized Anxiety Score  Nervous, Anxious, on Edge 0 0 0 0   Control/stop worrying 0 0 0 0  Worry too much - different things 0 0 0 0  Trouble relaxing 0 0 0 0  Restless 0 0 0 0  Easily annoyed or irritable 0 0 0 0  Afraid - awful might happen 0 0 0 0  Total GAD 7 Score 0 0 0 0  Anxiety Difficulty Not difficult at all          04/09/2022    1:31 PM  Depression screen PHQ 2/9  Decreased Interest 0  Down, Depressed, Hopeless 0  PHQ - 2 Score 0  Altered sleeping 0  Tired, decreased energy 0  Change in appetite 0  Feeling bad or failure about yourself  1  Trouble concentrating 0  Moving slowly or fidgety/restless 0  Suicidal thoughts 0  PHQ-9 Score 1  Difficult doing work/chores Not difficult at all    BP Readings from Last 3 Encounters:  04/09/22 136/60  12/09/21 130/70  07/17/21 107/73    Physical Exam Vitals and nursing note reviewed.  HENT:     Head: Normocephalic.     Right Ear: Tympanic membrane, ear canal and external ear normal.     Left Ear: Tympanic membrane, ear canal and external ear normal.     Nose: Nose normal.  Eyes:     General: No scleral icterus.       Right eye: No discharge.        Left eye: No discharge.     Conjunctiva/sclera: Conjunctivae normal.     Pupils: Pupils are equal, round, and reactive to light.  Neck:     Thyroid: No thyromegaly.     Vascular: No JVD.     Trachea: No tracheal deviation.  Cardiovascular:     Rate and Rhythm: Normal rate and regular rhythm.     Heart sounds: Normal heart sounds, S1 normal and S2 normal. No murmur heard. No systolic murmur is present.  No diastolic murmur is present.    No friction rub. No gallop. No S3 or S4 sounds.  Pulmonary:     Effort: No respiratory distress.     Breath sounds: Normal breath sounds. No wheezing, rhonchi or rales.  Abdominal:     General: Bowel sounds are normal.     Palpations: Abdomen is soft. There is no mass.     Tenderness: There is no abdominal tenderness. There is no guarding or rebound.  Musculoskeletal:        General:  No tenderness. Normal range of motion.     Cervical back: Normal range of motion and neck supple.  Lymphadenopathy:     Cervical: No cervical adenopathy.  Neurological:     Mental Status: He is alert.    Wt Readings from Last 3 Encounters:  04/09/22 212 lb (96.2 kg)  12/09/21 220 lb (99.8 kg)  07/17/21 202 lb (91.6 kg)    BP 136/60   Pulse 88   Ht 5' 7" (1.702 m)   Wt 212 lb (96.2 kg)   BMI 33.20 kg/m   Assessment and Plan:  1. Type 2 diabetes mellitus without complication, without long-term current use of insulin (HCC) Chronic.  Persistent.  Stable.  Continue metformin 500 mg twice a day and glimepiride 2 mg once a day.  We will check A1c today and renal function panel for current status.  We will recheck in 4 months. - HgB A1c - Renal Function Panel - metFORMIN (GLUCOPHAGE) 500 MG tablet; Take 1 tablet (500 mg total) by mouth 2 (two) times daily with a meal.  Dispense: 180 tablet; Refill: 1 - glimepiride (AMARYL) 2 MG tablet; One a day  Dispense: 90 tablet; Refill: 1

## 2022-04-10 LAB — RENAL FUNCTION PANEL
Albumin: 4.3 g/dL (ref 3.7–4.7)
BUN/Creatinine Ratio: 9 — ABNORMAL LOW (ref 10–24)
BUN: 8 mg/dL (ref 8–27)
CO2: 24 mmol/L (ref 20–29)
Calcium: 9.6 mg/dL (ref 8.6–10.2)
Chloride: 98 mmol/L (ref 96–106)
Creatinine, Ser: 0.87 mg/dL (ref 0.76–1.27)
Glucose: 154 mg/dL — ABNORMAL HIGH (ref 70–99)
Phosphorus: 2.1 mg/dL — ABNORMAL LOW (ref 2.8–4.1)
Potassium: 3.5 mmol/L (ref 3.5–5.2)
Sodium: 138 mmol/L (ref 134–144)
eGFR: 89 mL/min/{1.73_m2} (ref >59)

## 2022-04-10 LAB — HEMOGLOBIN A1C
Est. average glucose Bld gHb Est-mCnc: 143 mg/dL
Hgb A1c MFr Bld: 6.6 % — ABNORMAL HIGH (ref 4.8–5.6)

## 2022-04-16 ENCOUNTER — Other Ambulatory Visit: Payer: Self-pay | Admitting: Urology

## 2022-04-16 DIAGNOSIS — N3941 Urge incontinence: Secondary | ICD-10-CM

## 2022-05-12 ENCOUNTER — Other Ambulatory Visit: Payer: Self-pay | Admitting: Family Medicine

## 2022-05-12 DIAGNOSIS — I10 Essential (primary) hypertension: Secondary | ICD-10-CM

## 2022-07-16 ENCOUNTER — Other Ambulatory Visit: Payer: Self-pay | Admitting: *Deleted

## 2022-07-16 DIAGNOSIS — Z8546 Personal history of malignant neoplasm of prostate: Secondary | ICD-10-CM

## 2022-07-17 ENCOUNTER — Encounter: Payer: Self-pay | Admitting: Urology

## 2022-07-17 ENCOUNTER — Other Ambulatory Visit: Payer: Medicare PPO

## 2022-07-19 NOTE — Progress Notes (Deleted)
07/21/2022 7:05 PM   [name] 07-Oct-1946 [id number]  Referring provider: Juline Patch, MD [address] [place],  [place] [address]  Urological history: 1. Prostate cancer -PSA pending -s/p RPP > 10 years ago  2. UI -contributing factors of age, prostate cancer, pelvic surgery, sugary drinks, diabetes, depression and anxiety -managed with oxybutynin IR 5 mg TID  3. ED -contributing factors af age, prostate cancer, pelvic surgery, diabetes, HLD, HTN, hypothyroidism and smoking history -managed with ICI  No chief complaint on file.  HPI: DVON JILES is a 76 y.o. male who presents today for a yearly visit.      PVR ***  PMH: Past Medical History:  Diagnosis Date   Abdominal pain    RUQ   Arthritis    right ankle   ED (erectile dysfunction)    HLD (hyperlipidemia)    Hypertension    Hypothyroid    Prostate cancer ([facility])    Vertigo    1 episode only, approx 1 month ago    Surgical History: Past Surgical History:  Procedure Laterality Date   CATARACT EXTRACTION W/PHACO Left 06/04/2021   Procedure: CATARACT EXTRACTION PHACO AND INTRAOCULAR LENS PLACEMENT (IOC) LEFT DIABETIC 5.51 01:10.7;  Surgeon: Leandrew Koyanagi, MD;  Location: [facility];  Service: Ophthalmology;  Laterality: Left;   COLONOSCOPY  2006   normal   COLONOSCOPY WITH PROPOFOL N/A 09/06/2015   Procedure: COLONOSCOPY WITH PROPOFOL;  Surgeon: Lucilla Lame, MD;  Location: [facility];  Service: Endoscopy;  Laterality: N/A;   POLYPECTOMY  09/06/2015   Procedure: POLYPECTOMY;  Surgeon: Lucilla Lame, MD;  Location: [facility];  Service: Endoscopy;;   PROSTATE SURGERY     robotic prostatectomy  2006    Home Medications:  Allergies as of 07/21/2022   No Known Allergies      Medication List        Accurate as of July 19, 2022  7:05 PM. If you have any questions, ask your nurse or doctor.          glimepiride 2 MG tablet Commonly known as:  AMARYL One a day   levothyroxine 125 MCG tablet Commonly known as: Euthyrox Take 1 tablet (125 mcg total) by mouth daily.   losartan-hydrochlorothiazide 50-12.5 MG tablet Commonly known as: HYZAAR Take 1 tablet by mouth once daily   metFORMIN 500 MG tablet Commonly known as: GLUCOPHAGE Take 1 tablet (500 mg total) by mouth 2 (two) times daily with a meal.   NONFORMULARY OR COMPOUNDED ITEM Trimix (30/1/10)-(Pap/Phent/PGE)  Dosage: Inject 0.5 cc per injection   Vial 61m  Qty #10 Refills 6  CLower [id number] [phone] [phone]  oxybutynin 5 MG tablet Commonly known as: DITROPAN TAKE 1 TABLET BY MOUTH THREE TIMES DAILY   pravastatin 10 MG tablet Commonly known as: PRAVACHOL Take 1 tablet (10 mg total) by mouth daily.        Allergies: No Known Allergies  Family History: Family History  Problem Relation Age of Onset   Heart attack Father    Diabetes Mother    Kidney disease Brother     Social History:  reports that he has quit smoking. His smoking use included cigars and cigarettes. He has a 16.00 pack-year smoking history. His smokeless tobacco use includes snuff. He reports that he does not currently use alcohol. He reports that he does not use drugs.  ROS: For pertinent review of systems please refer to history of present illness  Physical Exam: There were no vitals taken for this visit.  Constitutional:  Well nourished. Alert and oriented, No acute distress. HEENT: [place] AT, moist mucus membranes.  Trachea midline Cardiovascular: No clubbing, cyanosis, or edema. Respiratory: Normal respiratory effort, no increased work of breathing. GU: No CVA tenderness.  No bladder fullness or masses.  Patient with circumcised/uncircumcised phallus. ***Foreskin easily retracted***  Urethral meatus is patent.  No penile discharge. No penile lesions or rashes. Scrotum without lesions, cysts, rashes and/or edema.  Testicles are located scrotally bilaterally. No  masses are appreciated in the testicles. Left and right epididymis are normal. Rectal: Patient with  normal sphincter tone. Anus and perineum without scarring or rashes. No rectal masses are appreciated. Prostate is approximately *** grams, *** nodules are appreciated. Seminal vesicles are normal. Neurologic: Grossly intact, no focal deficits, moving all 4 extremities. Psychiatric: Normal mood and affect.   Laboratory Data: Lab Results  Component Value Date   CREATININE 0.87 04/09/2022   Lab Results  Component Value Date   HGBA1C 6.6 (H) 04/09/2022  I have reviewed the labs.  Pertinent Imaging ***   Assessment & Plan:    1. Urge incontinence -minimal bother -Continue  oxybutynin 5 mg tid-refills given -BLADDER SCAN AMB NON-IMAGING  2. Erectile dysfunction -in a new relationship -continue Trimix 30/1/10, 5 mcg -refills given  3. Prostate cancer -PSA pending   No follow-ups on file.  Valerie Cones, [facility] [address], [place], [place] [address] [phone]

## 2022-07-21 ENCOUNTER — Ambulatory Visit: Payer: Medicare PPO | Admitting: Urology

## 2022-07-21 DIAGNOSIS — C61 Malignant neoplasm of prostate: Secondary | ICD-10-CM

## 2022-07-21 DIAGNOSIS — N3941 Urge incontinence: Secondary | ICD-10-CM

## 2022-07-21 DIAGNOSIS — N5231 Erectile dysfunction following radical prostatectomy: Secondary | ICD-10-CM

## 2022-07-24 ENCOUNTER — Other Ambulatory Visit: Payer: Self-pay | Admitting: Family Medicine

## 2022-07-24 DIAGNOSIS — E782 Mixed hyperlipidemia: Secondary | ICD-10-CM

## 2022-07-29 ENCOUNTER — Other Ambulatory Visit: Payer: Medicare PPO

## 2022-07-29 DIAGNOSIS — Z8546 Personal history of malignant neoplasm of prostate: Secondary | ICD-10-CM

## 2022-07-30 LAB — PSA: Prostate Specific Ag, Serum: <0.1 ng/mL (ref 0.0–4.0)

## 2022-08-02 NOTE — Progress Notes (Unsigned)
08/03/2022 7:30 PM   Daniel Kirby September 30, 1946 [id number]  Referring provider: Juline Patch, MD [address] [place],   [address]  Urological history: 1. Prostate cancer -PSA (07/2022) <0.1 -s/p RPP > 10 years ago  2. UI -contributing factors of age, prostate cancer, pelvic surgery, sugary drinks, diabetes, depression and anxiety -PVR *** -managed with oxybutynin IR 5 mg TID  3. ED -contributing factors af age, prostate cancer, pelvic surgery, diabetes, HLD, HTN, hypothyroidism and smoking history -managed with ICI  No chief complaint on file.  HPI: Daniel Kirby is a 76 y.o. male who presents today for a yearly visit.      PVR ***  PMH: Past Medical History:  Diagnosis Date   Abdominal pain    RUQ   Arthritis    right ankle   ED (erectile dysfunction)    HLD (hyperlipidemia)    Hypertension    Hypothyroid    Prostate cancer ([facility])    Vertigo    1 episode only, approx 1 month ago    Surgical History: Past Surgical History:  Procedure Laterality Date   CATARACT EXTRACTION W/PHACO Left 06/04/2021   Procedure: CATARACT EXTRACTION PHACO AND INTRAOCULAR LENS PLACEMENT (IOC) LEFT DIABETIC 5.51 01:10.7;  Surgeon: Leandrew Koyanagi, MD;  Location: [facility];  Service: Ophthalmology;  Laterality: Left;   COLONOSCOPY  2006   normal   COLONOSCOPY WITH PROPOFOL N/A 09/06/2015   Procedure: COLONOSCOPY WITH PROPOFOL;  Surgeon: Lucilla Lame, MD;  Location: [facility];  Service: Endoscopy;  Laterality: N/A;   POLYPECTOMY  09/06/2015   Procedure: POLYPECTOMY;  Surgeon: Lucilla Lame, MD;  Location: [facility];  Service: Endoscopy;;   PROSTATE SURGERY     robotic prostatectomy  2006    Home Medications:  Allergies as of 08/03/2022   No Known Allergies      Medication List        Accurate as of August 02, 2022  7:30 PM. If you have any questions, ask your nurse or doctor.          glimepiride 2 MG  tablet Commonly known as: AMARYL One a day   levothyroxine 125 MCG tablet Commonly known as: Euthyrox Take 1 tablet (125 mcg total) by mouth daily.   losartan-hydrochlorothiazide 50-12.5 MG tablet Commonly known as: HYZAAR Take 1 tablet by mouth once daily   metFORMIN 500 MG tablet Commonly known as: GLUCOPHAGE Take 1 tablet (500 mg total) by mouth 2 (two) times daily with a meal.   NONFORMULARY OR COMPOUNDED ITEM Trimix (30/1/10)-(Pap/Phent/PGE)  Dosage: Inject 0.5 cc per injection   Vial 77m  Qty #10 Refills 6  [phone] [phone]  oxybutynin 5 MG tablet Commonly known as: DITROPAN TAKE 1 TABLET BY MOUTH THREE TIMES DAILY   pravastatin 10 MG tablet Commonly known as: PRAVACHOL Take 1 tablet by mouth once daily        Allergies: No Known Allergies  Family History: Family History  Problem Relation Age of Onset   Heart attack Father    Diabetes Mother    Kidney disease Brother     Social History:  reports that he has quit smoking. His smoking use included cigars and cigarettes. He has a 16.00 pack-year smoking history. His smokeless tobacco use includes snuff. He reports that he does not currently use alcohol. He reports that he does not use drugs.  ROS: For pertinent review of systems please refer to history of present illness  Physical Exam: There were no vitals taken for this visit.  Constitutional:  Well nourished. Alert and oriented, No acute distress. HEENT: [place] AT, moist mucus membranes.  Trachea midline Cardiovascular: No clubbing, cyanosis, or edema. Respiratory: Normal respiratory effort, no increased work of breathing. GU: No CVA tenderness.  No bladder fullness or masses.  Patient with circumcised/uncircumcised phallus. ***Foreskin easily retracted***  Urethral meatus is patent.  No penile discharge. No penile lesions or rashes. Scrotum without lesions, cysts, rashes and/or edema.  Testicles are located scrotally  bilaterally. No masses are appreciated in the testicles. Left and right epididymis are normal. Rectal: Patient with  normal sphincter tone. Anus and perineum without scarring or rashes. No rectal masses are appreciated. Prostate is approximately *** grams, *** nodules are appreciated. Seminal vesicles are normal. Neurologic: Grossly intact, no focal deficits, moving all 4 extremities. Psychiatric: Normal mood and affect.   Laboratory Data: Lab Results  Component Value Date   CREATININE 0.87 04/09/2022   Lab Results  Component Value Date   HGBA1C 6.6 (H) 04/09/2022  I have reviewed the labs.  Pertinent Imaging ***   Assessment & Plan:    1. Urge incontinence -minimal bother -Continue  oxybutynin 5 mg tid-refills given -BLADDER SCAN AMB NON-IMAGING  2. Erectile dysfunction -in a new relationship -continue Trimix 30/1/10, 5 mcg -refills given  3. Prostate cancer -PSA undetectable    No follow-ups on file.  Wilkin Lippy, [facility] [address] [place], [place] [address] [phone]

## 2022-08-03 ENCOUNTER — Encounter: Payer: Self-pay | Admitting: Urology

## 2022-08-03 ENCOUNTER — Ambulatory Visit (INDEPENDENT_AMBULATORY_CARE_PROVIDER_SITE_OTHER): Payer: Medicare PPO | Admitting: Urology

## 2022-08-03 VITALS — BP 156/86 | HR 73 | Ht 67.0 in | Wt 225.0 lb

## 2022-08-03 DIAGNOSIS — N3941 Urge incontinence: Secondary | ICD-10-CM

## 2022-08-03 DIAGNOSIS — N5231 Erectile dysfunction following radical prostatectomy: Secondary | ICD-10-CM

## 2022-08-03 DIAGNOSIS — C61 Malignant neoplasm of prostate: Secondary | ICD-10-CM

## 2022-08-03 LAB — BLADDER SCAN AMB NON-IMAGING

## 2022-08-03 MED ORDER — MIRABEGRON ER 25 MG PO TB24: 25.0000 mg | ORAL_TABLET | Freq: Every day | ORAL | 0 refills | Status: DC

## 2022-08-03 MED ORDER — NONFORMULARY OR COMPOUNDED ITEM: 6 refills | Status: DC

## 2022-08-10 ENCOUNTER — Encounter: Payer: Self-pay | Admitting: Family Medicine

## 2022-08-10 ENCOUNTER — Ambulatory Visit (INDEPENDENT_AMBULATORY_CARE_PROVIDER_SITE_OTHER): Payer: Medicare PPO | Admitting: Family Medicine

## 2022-08-10 VITALS — BP 150/88 | HR 88 | Ht 67.0 in | Wt 219.0 lb

## 2022-08-10 DIAGNOSIS — E782 Mixed hyperlipidemia: Secondary | ICD-10-CM | POA: Diagnosis not present

## 2022-08-10 DIAGNOSIS — I10 Essential (primary) hypertension: Secondary | ICD-10-CM | POA: Diagnosis not present

## 2022-08-10 DIAGNOSIS — E039 Hypothyroidism, unspecified: Secondary | ICD-10-CM | POA: Diagnosis not present

## 2022-08-10 DIAGNOSIS — E119 Type 2 diabetes mellitus without complications: Secondary | ICD-10-CM | POA: Diagnosis not present

## 2022-08-10 MED ORDER — GLIMEPIRIDE 2 MG PO TABS: ORAL_TABLET | ORAL | 1 refills | Status: DC

## 2022-08-10 MED ORDER — LOSARTAN POTASSIUM-HCTZ 100-25 MG PO TABS: 1.0000 | ORAL_TABLET | Freq: Every day | ORAL | 0 refills | Status: DC

## 2022-08-10 MED ORDER — LEVOTHYROXINE SODIUM 125 MCG PO TABS: 125.0000 ug | ORAL_TABLET | Freq: Every day | ORAL | 5 refills | Status: DC

## 2022-08-10 MED ORDER — METFORMIN HCL 500 MG PO TABS: 500.0000 mg | ORAL_TABLET | Freq: Two times a day (BID) | ORAL | 1 refills | Status: DC

## 2022-08-10 MED ORDER — PRAVASTATIN SODIUM 10 MG PO TABS: 10.0000 mg | ORAL_TABLET | Freq: Every day | ORAL | 1 refills | Status: DC

## 2022-08-10 NOTE — Progress Notes (Signed)
Date:  08/10/2022   Name:  Daniel Kirby   DOB:  27-Dec-1945   MRN:  [id number]   Chief Complaint: Hyperlipidemia, Hypertension (Has had a dry cough x 1 month), Hypothyroidism, and Diabetes  Hyperlipidemia This is a chronic problem. The current episode started more than 1 year ago. The problem is controlled. Recent lipid tests were reviewed and are normal. He has no history of chronic renal disease, diabetes, hypothyroidism, liver disease, obesity or nephrotic syndrome. There are no known factors aggravating his hyperlipidemia. Pertinent negatives include no chest pain or shortness of breath. Current antihyperlipidemic treatment includes statins. The current treatment provides moderate improvement of lipids. There are no compliance problems.  There are no known risk factors for coronary artery disease.  Hypertension This is a chronic problem. The current episode started more than 1 year ago. The problem has been gradually worsening since onset. The problem is uncontrolled. Pertinent negatives include no chest pain, headaches, orthopnea, palpitations, PND or shortness of breath. There are no associated agents to hypertension. There are no known risk factors for coronary artery disease. Past treatments include angiotensin blockers and diuretics. The current treatment provides moderate improvement. There are no compliance problems.  There is no history of angina, kidney disease, CAD/MI, CVA, heart failure, left ventricular hypertrophy, PVD or retinopathy. Identifiable causes of hypertension include a thyroid problem. There is no history of chronic renal disease, a hypertension causing med or renovascular disease.  Diabetes He presents for his follow-up diabetic visit. He has type 2 diabetes mellitus. His disease course has been stable. Pertinent negatives for hypoglycemia include no dizziness or headaches. There are no diabetic associated symptoms. Pertinent negatives for diabetes include no chest pain,  no fatigue, no polydipsia and no polyuria. There are no hypoglycemic complications. Symptoms are stable. There are no diabetic complications. Pertinent negatives for diabetic complications include no CVA, PVD or retinopathy. His weight is stable. He is following a generally healthy diet. Meal planning includes avoidance of concentrated sweets and carbohydrate counting. He participates in exercise daily. An ACE inhibitor/angiotensin II receptor blocker is being taken.  Thyroid Problem Presents for follow-up visit. Patient reports no depressed mood, diarrhea, fatigue, hair loss, hoarse voice or palpitations. The symptoms have been stable. His past medical history is significant for hyperlipidemia. There is no history of diabetes or heart failure.    Lab Results  Component Value Date   NA 138 04/09/2022   K 3.5 04/09/2022   CO2 24 04/09/2022   GLUCOSE 154 (H) 04/09/2022   BUN 8 04/09/2022   CREATININE 0.87 04/09/2022   CALCIUM 9.6 04/09/2022   EGFR 89 04/09/2022   GFRNONAA >60 07/16/2021   Lab Results  Component Value Date   CHOL 139 07/16/2021   HDL 31 (L) 07/16/2021   LDLCALC 78 07/16/2021   TRIG 150 (H) 07/16/2021   CHOLHDL 4.5 07/16/2021   Lab Results  Component Value Date   TSH 3.780 12/09/2021   Lab Results  Component Value Date   HGBA1C 6.6 (H) 04/09/2022   Lab Results  Component Value Date   WBC 6.1 12/19/2018   HGB 14.5 12/19/2018   HCT 41.0 12/19/2018   MCV 86.5 12/19/2018   PLT 168 12/19/2018   Lab Results  Component Value Date   ALT 13 12/09/2021   AST 13 12/09/2021   ALKPHOS 76 12/09/2021   BILITOT 0.7 12/09/2021   No results found for: "25OHVITD2", "25OHVITD3", "VD25OH"   Review of Systems  Constitutional:  Negative for fatigue  and unexpected weight change.  HENT:  Negative for hoarse voice and trouble swallowing.   Respiratory:  Negative for shortness of breath.   Cardiovascular:  Negative for chest pain, palpitations, orthopnea and PND.   Gastrointestinal:  Negative for abdominal distention and diarrhea.  Endocrine: Negative for polydipsia and polyuria.  Neurological:  Negative for dizziness and headaches.    Patient Active Problem List   Diagnosis Date Noted   Type 2 diabetes mellitus without complication, without long-term current use of insulin ([facility]) 01/18/2018   Chronic low back pain without sciatica 06/08/2017   Essential hypertension 12/08/2016   Adult hypothyroidism 12/08/2016   Arthritis 12/08/2016   Acute anxiety 12/08/2016   Cervical radiculopathy 12/08/2016   Mixed hyperlipidemia 12/08/2016   Special screening for malignant neoplasms, colon    Benign neoplasm of ascending colon    Major depressive disorder with single episode, in partial remission ([facility]) 08/20/2015   Erectile dysfunction 06/04/2015   History of prostate cancer 06/04/2015    No Known Allergies  Past Surgical History:  Procedure Laterality Date   CATARACT EXTRACTION W/PHACO Left 06/04/2021   Procedure: CATARACT EXTRACTION PHACO AND INTRAOCULAR LENS PLACEMENT (IOC) LEFT DIABETIC 5.51 01:10.7;  Surgeon: Leandrew Koyanagi, MD;  Location: [facility];  Service: Ophthalmology;  Laterality: Left;   COLONOSCOPY  2006   normal   COLONOSCOPY WITH PROPOFOL N/A 09/06/2015   Procedure: COLONOSCOPY WITH PROPOFOL;  Surgeon: Lucilla Lame, MD;  Location: [facility];  Service: Endoscopy;  Laterality: N/A;   POLYPECTOMY  09/06/2015   Procedure: POLYPECTOMY;  Surgeon: Lucilla Lame, MD;  Location: [facility];  Service: Endoscopy;;   PROSTATE SURGERY     robotic prostatectomy  2006    Social History   Tobacco Use   Smoking status: Former    Packs/day: 1.00    Years: 16.00    Total pack years: 16.00    Types: Cigars, Cigarettes   Smokeless tobacco: Current    Types: Snuff   Tobacco comments:    Quit 2007. Smoking cessation materials not required  Vaping Use   Vaping Use: Never used  Substance Use Topics   Alcohol use:  Not Currently    Alcohol/week: 0.0 standard drinks of alcohol    Comment: occasional, 1-2 drinks per year   Drug use: No     Medication list has been reviewed and updated.  Current Meds  Medication Sig   glimepiride (AMARYL) 2 MG tablet One a day   levothyroxine (EUTHYROX) 125 MCG tablet Take 1 tablet (125 mcg total) by mouth daily.   losartan-hydrochlorothiazide (HYZAAR) 50-12.5 MG tablet Take 1 tablet by mouth once daily   metFORMIN (GLUCOPHAGE) 500 MG tablet Take 1 tablet (500 mg total) by mouth 2 (two) times daily with a meal.   mirabegron ER (MYRBETRIQ) 25 MG TB24 tablet Take 1 tablet (25 mg total) by mouth daily.   NONFORMULARY OR COMPOUNDED ITEM Trimix (30/1/10)-(Pap/Phent/PGE)  Dosage: Inject 0.5 cc per injection   Vial 75m  Qty #10 Refills 6  [phone] [phone]  pravastatin (PRAVACHOL) 10 MG tablet Take 1 tablet by mouth once daily       08/10/2022    2:38 PM 04/09/2022    1:31 PM 07/16/2021    9:05 AM 03/14/2021    9:39 AM  GAD 7 : Generalized Anxiety Score  Nervous, Anxious, on Edge 0 0 0 0  Control/stop worrying 0 0 0 0  Worry too much - different things 0 0 0  0  Trouble relaxing 0 0 0 0  Restless 0 0 0 0  Easily annoyed or irritable 1 0 0 0  Afraid - awful might happen 0 0 0 0  Total GAD 7 Score 1 0 0 0  Anxiety Difficulty Not difficult at all Not difficult at all         08/10/2022    2:38 PM 04/09/2022    1:31 PM 11/05/2021    1:26 PM  Depression screen PHQ 2/9  Decreased Interest 0 0 0  Down, Depressed, Hopeless 0 0 0  PHQ - 2 Score 0 0 0  Altered sleeping 0 0   Tired, decreased energy 0 0   Change in appetite 0 0   Feeling bad or failure about yourself  0 1   Trouble concentrating 0 0   Moving slowly or fidgety/restless 0 0   Suicidal thoughts 0 0   PHQ-9 Score 0 1   Difficult doing work/chores Not difficult at all Not difficult at all     BP Readings from Last 3 Encounters:  08/10/22 (!) 140/98   08/03/22 (!) 156/86  04/09/22 136/60    Physical Exam Vitals and nursing note reviewed.  HENT:     Head: Normocephalic.     Right Ear: External ear normal.     Left Ear: External ear normal.     Nose: Nose normal.     Mouth/Throat:     Mouth: Mucous membranes are moist.  Eyes:     General: No scleral icterus.       Right eye: No discharge.        Left eye: No discharge.     Conjunctiva/sclera: Conjunctivae normal.     Pupils: Pupils are equal, round, and reactive to light.  Neck:     Thyroid: No thyromegaly.     Vascular: No JVD.     Trachea: No tracheal deviation.  Cardiovascular:     Rate and Rhythm: Normal rate and regular rhythm.     Heart sounds: Normal heart sounds. No murmur heard.    No friction rub. No gallop.  Pulmonary:     Effort: No respiratory distress.     Breath sounds: Normal breath sounds. No wheezing or rales.  Abdominal:     General: Bowel sounds are normal.     Palpations: Abdomen is soft. There is no mass.     Tenderness: There is no abdominal tenderness. There is no guarding or rebound.  Musculoskeletal:        General: No tenderness. Normal range of motion.     Cervical back: Normal range of motion and neck supple.  Lymphadenopathy:     Cervical: No cervical adenopathy.  Skin:    General: Skin is warm.     Findings: No rash.  Neurological:     Mental Status: He is alert and oriented to person, place, and time.     Cranial Nerves: No cranial nerve deficit.     Deep Tendon Reflexes: Reflexes are normal and symmetric.     Wt Readings from Last 3 Encounters:  08/10/22 219 lb (99.3 kg)  08/03/22 225 lb (102.1 kg)  04/09/22 212 lb (96.2 kg)    BP (!) 140/98   Pulse 88   Ht 5' 7" (1.702 m)   Wt 219 lb (99.3 kg)   BMI 34.30 kg/m   Assessment and Plan:  1. Type 2 diabetes mellitus without complication, without long-term current use of insulin (H[facility]) Chronic.  Trolled.  Stable.  Continue metformin 500 mg twice a day.  We will check A1c  and microalbumin.- metFORMIN (GLUCOPHAGE) 500 MG tablet; Take 1 tablet (500 mg total) by mouth 2 (two) times daily with a meal.  Dispense: 180 tablet; Refill: 1 - HgB A1c - Microalbumin / creatinine urine ratio  2. Adult hypothyroidism Chronic.  Controlled.  Stable.  Continue levothyroxine 125 mcg once a day.  Recheck TSH in 6 months. - levothyroxine (EUTHYROX) 125 MCG tablet; Take 1 tablet (125 mcg total) by mouth daily.  Dispense: 30 tablet; Refill: 5  3. Essential hypertension Chronic.  Uncontrolled.  Stable.  Upon recheck blood pressure is 150/88.  We will increase dosing of losartan hydrochlorothiazide to 100-25 mg we will recheck in 3 months. - losartan-hydrochlorothiazide (HYZAAR) 100-25 MG tablet; Take 1 tablet by mouth daily.  Dispense: 90 tablet; Refill: 0  4. Mixed hyperlipidemia Chronic.  Controlled.  Stable.  Continue pravastatin 10 mg once a day and will recheck in 6 months. - pravastatin (PRAVACHOL) 10 MG tablet; Take 1 tablet (10 mg total) by mouth daily.  Dispense: 90 tablet; Refill: 1    Otilio Miu, MD

## 2022-08-11 LAB — MICROALBUMIN / CREATININE URINE RATIO
Creatinine, Urine: 165.7 mg/dL
Microalb/Creat Ratio: 12 mg/g creat (ref 0–29)
Microalbumin, Urine: 19.4 ug/mL

## 2022-08-11 LAB — HEMOGLOBIN A1C
Est. average glucose Bld gHb Est-mCnc: 134 mg/dL
Hgb A1c MFr Bld: 6.3 % — ABNORMAL HIGH (ref 4.8–5.6)

## 2022-08-12 ENCOUNTER — Telehealth: Payer: Self-pay | Admitting: Family Medicine

## 2022-08-12 NOTE — Telephone Encounter (Signed)
Copied from [place] [id number]. Topic: General - Other >> Aug 12, 2022  3:41 PM Ja-Kwan M wrote: Reason for CRM: Pt stated he was returning call to Dr Ronnald Ramp nurse regarding results from his appt on 08/10/22. Cb# [phone]

## 2022-08-13 ENCOUNTER — Telehealth: Payer: Self-pay

## 2022-08-13 NOTE — Telephone Encounter (Signed)
Called pt back and left message going over labs

## 2022-08-21 ENCOUNTER — Other Ambulatory Visit: Payer: Self-pay | Admitting: Urology

## 2022-08-21 DIAGNOSIS — N3941 Urge incontinence: Secondary | ICD-10-CM

## 2022-08-24 ENCOUNTER — Telehealth: Payer: Self-pay | Admitting: Urology

## 2022-08-24 NOTE — Telephone Encounter (Signed)
Patient returned the call stating that he is taking Myrbetriq '25mg'$ , but it is not working.   He would like to either increase dosage for the Myrbetriq or switch to another medication.   He would like a prescription sent to the [facility] in [place].  Please advise.

## 2022-08-24 NOTE — Telephone Encounter (Signed)
Pharmacy is requesting a refill for his oxybutynin, but per my last notes we switched him to Myrbetriq 25 mg daily.  Can we confirm with the patient regarding which prescription he is wanting filled?

## 2022-08-24 NOTE — Telephone Encounter (Signed)
LMOM for pt to return call. Attempt #1.

## 2022-08-25 NOTE — Telephone Encounter (Signed)
LMOM for pt to return call and set up lab appt for sample dropoff; UA & UCX.

## 2022-09-01 ENCOUNTER — Telehealth: Payer: Self-pay | Admitting: Urology

## 2022-09-01 NOTE — Telephone Encounter (Signed)
Pt is out of samples of Myrbetriq 25 mg and would like an RX sent in to [facility] in [place].

## 2022-09-02 MED ORDER — MIRABEGRON ER 25 MG PO TB24: 25.0000 mg | ORAL_TABLET | Freq: Every day | ORAL | 1 refills | Status: DC

## 2022-09-02 NOTE — Telephone Encounter (Signed)
Myrbetriq '25mg'$  sent to pt's pharmacy.

## 2022-09-02 NOTE — Addendum Note (Signed)
Addended by: Evelina Bucy on: 09/02/2022 01:18 PM   Modules accepted: Orders

## 2022-10-30 ENCOUNTER — Telehealth: Payer: Self-pay

## 2022-10-30 ENCOUNTER — Telehealth: Payer: Self-pay | Admitting: Family Medicine

## 2022-10-30 ENCOUNTER — Other Ambulatory Visit: Payer: Self-pay | Admitting: Urology

## 2022-10-30 DIAGNOSIS — I10 Essential (primary) hypertension: Secondary | ICD-10-CM

## 2022-10-30 NOTE — Telephone Encounter (Signed)
Requested Prescriptions  Pending Prescriptions Disp Refills   losartan-hydrochlorothiazide (HYZAAR) 100-25 MG tablet [Pharmacy Med Name: Losartan Potassium-HCTZ 100-25 MG Oral Tablet] 90 tablet 1    Sig: Take 1 tablet by mouth once daily     Cardiovascular: ARB + Diuretic Combos Failed - 10/30/2022  9:37 AM      Failed - K in normal range and within 180 days    Potassium  Date Value Ref Range Status  04/09/2022 3.5 3.5 - 5.2 mmol/L Final         Failed - Na in normal range and within 180 days    Sodium  Date Value Ref Range Status  04/09/2022 138 134 - 144 mmol/L Final         Failed - Cr in normal range and within 180 days    Creatinine, Ser  Date Value Ref Range Status  04/09/2022 0.87 0.76 - 1.27 mg/dL Final         Failed - eGFR is 10 or above and within 180 days    GFR calc Af Amer  Date Value Ref Range Status  11/13/2020 84 >59 mL/min/1.73 Final    Comment:    **In accordance with recommendations from the NKF-ASN Task force,**   [facility] is in the process of updating its eGFR calculation to the   2021 CKD-EPI creatinine equation that estimates kidney function   without a race variable.    GFR, Estimated  Date Value Ref Range Status  07/16/2021 >60 >60 mL/min Final    Comment:    (NOTE) Calculated using the CKD-EPI Creatinine Equation (2021)    eGFR  Date Value Ref Range Status  04/09/2022 89 >59 mL/min/1.73 Final         Failed - Last BP in normal range    BP Readings from Last 1 Encounters:  08/10/22 (!) 150/88         Passed - Patient is not pregnant      Passed - Valid encounter within last 6 months    Recent Outpatient Visits           2 months ago Type 2 diabetes mellitus without complication, without long-term current use of insulin ([place])   [facility], [place]   6 months ago Type 2 diabetes mellitus without complication, without long-term current use of insulin ([place])   [facility], [place]   10 months ago Type 2 diabetes mellitus without complication, without long-term current use of insulin ([place])   [facility] [facility] at [facility], [place]   1 year ago Type 2 diabetes mellitus without complication, without long-term current use of insulin ([place])   [place] [facility] [place], Deanna C, MD   1 year ago Type 2 diabetes mellitus without complication, without long-term current use of insulin ([place])   [place] [facility] [place], [place], [place]       Future Appointments             In 1 month Juline Patch, MD [facility], [facility]   In 9 months [place], Gordan [facility] [place]

## 2022-10-30 NOTE — Telephone Encounter (Signed)
Error

## 2022-11-11 ENCOUNTER — Ambulatory Visit: Payer: Medicare PPO

## 2022-11-20 NOTE — Telephone Encounter (Signed)
Called pt told him to call the pharmacy. Pt should have refills.  KP

## 2022-11-20 NOTE — Telephone Encounter (Signed)
Patient needs medication refill for his thyroid he's completely out.

## 2022-11-23 ENCOUNTER — Ambulatory Visit (INDEPENDENT_AMBULATORY_CARE_PROVIDER_SITE_OTHER): Payer: Medicare PPO

## 2022-11-23 DIAGNOSIS — Z0000 Encounter for general adult medical examination without abnormal findings: Secondary | ICD-10-CM

## 2022-11-23 DIAGNOSIS — Z789 Other specified health status: Secondary | ICD-10-CM

## 2022-11-23 NOTE — Patient Instructions (Signed)
Health Maintenance, Male Adopting a healthy lifestyle and getting preventive care are important in promoting health and wellness. Ask your health care provider about: The right schedule for you to have regular tests and exams. Things you can do on your own to prevent diseases and keep yourself healthy. What should I know about diet, weight, and exercise? Eat a healthy diet  Eat a diet that includes plenty of vegetables, fruits, low-fat dairy products, and lean protein. Do not eat a lot of foods that are high in solid fats, added sugars, or sodium. Maintain a healthy weight Body mass index (BMI) is a measurement that can be used to identify possible weight problems. It estimates body fat based on height and weight. Your health care provider can help determine your BMI and help you achieve or maintain a healthy weight. Get regular exercise Get regular exercise. This is one of the most important things you can do for your health. Most adults should: Exercise for at least 150 minutes each week. The exercise should increase your heart rate and make you sweat (moderate-intensity exercise). Do strengthening exercises at least twice a week. This is in addition to the moderate-intensity exercise. Spend less time sitting. Even light physical activity can be beneficial. Watch cholesterol and blood lipids Have your blood tested for lipids and cholesterol at 77 years of age, then have this test every 5 years. You may need to have your cholesterol levels checked more often if: Your lipid or cholesterol levels are high. You are older than 77 years of age. You are at high risk for heart disease. What should I know about cancer screening? Many types of cancers can be detected early and may often be prevented. Depending on your health history and family history, you may need to have cancer screening at various ages. This may include screening for: Colorectal cancer. Prostate cancer. Skin cancer. Lung  cancer. What should I know about heart disease, diabetes, and high blood pressure? Blood pressure and heart disease High blood pressure causes heart disease and increases the risk of stroke. This is more likely to develop in people who have high blood pressure readings or are overweight. Talk with your health care provider about your target blood pressure readings. Have your blood pressure checked: Every 3-5 years if you are 18-39 years of age. Every year if you are 40 years old or older. If you are between the ages of 65 and 75 and are a current or former smoker, ask your health care provider if you should have a one-time screening for abdominal aortic aneurysm (AAA). Diabetes Have regular diabetes screenings. This checks your fasting blood sugar level. Have the screening done: Once every three years after age 45 if you are at a normal weight and have a low risk for diabetes. More often and at a younger age if you are overweight or have a high risk for diabetes. What should I know about preventing infection? Hepatitis B If you have a higher risk for hepatitis B, you should be screened for this virus. Talk with your health care provider to find out if you are at risk for hepatitis B infection. Hepatitis C Blood testing is recommended for: Everyone born from 1945 through 1965. Anyone with known risk factors for hepatitis C. Sexually transmitted infections (STIs) You should be screened each year for STIs, including gonorrhea and chlamydia, if: You are sexually active and are younger than 77 years of age. You are older than 77 years of age and your   health care provider tells you that you are at risk for this type of infection. Your sexual activity has changed since you were last screened, and you are at increased risk for chlamydia or gonorrhea. Ask your health care provider if you are at risk. Ask your health care provider about whether you are at high risk for HIV. Your health care provider  may recommend a prescription medicine to help prevent HIV infection. If you choose to take medicine to prevent HIV, you should first get tested for HIV. You should then be tested every 3 months for as long as you are taking the medicine. Follow these instructions at home: Alcohol use Do not drink alcohol if your health care provider tells you not to drink. If you drink alcohol: Limit how much you have to 0-2 drinks a day. Know how much alcohol is in your drink. In the [place] one drink equals one 12 oz bottle of beer (355 mL), one 5 oz glass of wine (148 mL), or one 1 oz glass of hard liquor (44 mL). Lifestyle Do not use any products that contain nicotine or tobacco. These products include cigarettes, chewing tobacco, and vaping devices, such as e-cigarettes. If you need help quitting, ask your health care provider. Do not use street drugs. Do not share needles. Ask your health care provider for help if you need support or information about quitting drugs. General instructions Schedule regular health, dental, and eye exams. Stay current with your vaccines. Tell your health care provider if: You often feel depressed. You have ever been abused or do not feel safe at home. Summary Adopting a healthy lifestyle and getting preventive care are important in promoting health and wellness. Follow your health care provider's instructions about healthy diet, exercising, and getting tested or screened for diseases. Follow your health care provider's instructions on monitoring your cholesterol and blood pressure. This information is not intended to replace advice given to you by your health care provider. Make sure you discuss any questions you have with your health care provider. Document Revised: 03/24/2021 Document Reviewed: 03/24/2021 Elsevier Patient Education  2023 [employer]  

## 2022-11-23 NOTE — Progress Notes (Signed)
I connected with  Daniel Kirby on 11/23/22 by a audio enabled telemedicine application and verified that I am speaking with the correct person using two identifiers.  Patient Location: Home  Provider Location: Office/Clinic  I discussed the limitations of evaluation and management by telemedicine. The patient expressed understanding and agreed to proceed.  Subjective:   Daniel Kirby is a 77 y.o. male who presents for Medicare Annual/Subsequent preventive examination.  Review of Systems    Per HPI unless specifically indicated below.  Cardiac Risk Factors include: advanced age (>80mn, >>82women);male gender, Essential hypertension, Type 2 diabetes, and Mixed hyperlipidemia.          Objective:        08/10/2022    2:56 PM 08/10/2022    2:36 PM 08/03/2022    2:01 PM  Vitals with BMI  Height  '5\' 7"'$  '5\' 7"'$   Weight  219 lbs 225 lbs  BMI  [phone] Systolic [id number] Diastolic 88 98 86  Pulse  88 73    There were no vitals filed for this visit. There is no height or weight on file to calculate BMI.     11/05/2021    1:26 PM 06/04/2021   11:25 AM 11/04/2020    1:33 PM 11/10/2017    9:05 AM 09/06/2015   10:01 AM 07/24/2015    9:21 AM  Advanced Directives  Does Patient Have a Medical Advance Directive? No No No No No No  Does patient want to make changes to medical advance directive?  No - Patient declined      Copy of HEstacadain Chart?  No - copy requested      Would patient like information on creating a medical advance directive? Yes (MAU/Ambulatory/Procedural Areas - Information given)  Yes (MAU/Ambulatory/Procedural Areas - Information given) Yes (MAU/Ambulatory/Procedural Areas - Information given) No - patient declined information No - patient declined information    Current Medications (verified) Outpatient Encounter Medications as of 11/23/2022  Medication Sig   glimepiride (AMARYL) 2 MG tablet One a day   levothyroxine (EUTHYROX) 125  MCG tablet Take 1 tablet (125 mcg total) by mouth daily.   losartan-hydrochlorothiazide (HYZAAR) 100-25 MG tablet Take 1 tablet by mouth once daily   metFORMIN (GLUCOPHAGE) 500 MG tablet Take 1 tablet (500 mg total) by mouth 2 (two) times daily with a meal.   MYRBETRIQ 25 MG TB24 tablet Take 1 tablet by mouth once daily   NONFORMULARY OR COMPOUNDED ITEM Trimix (30/1/10)-(Pap/Phent/PGE)  Dosage: Inject 0.5 cc per injection   Vial 129m Qty #10 Refills 6  [id number] [phone] pravastatin (PRAVACHOL) 10 MG tablet Take 1 tablet (10 mg total) by mouth daily.   No facility-administered encounter medications on file as of 11/23/2022.    Allergies (verified) Patient has no known allergies.   History: Past Medical History:  Diagnosis Date   Abdominal pain    RUQ   Arthritis    right ankle   ED (erectile dysfunction)    HLD (hyperlipidemia)    Hypertension    Hypothyroid    Prostate cancer (HCPurdy   Vertigo    1 episode only, approx 1 month ago   Past Surgical History:  Procedure Laterality Date   CATARACT EXTRACTION W/PHACO Left 06/04/2021   Procedure: CATARACT EXTRACTION PHACO AND INTRAOCULAR LENS PLACEMENT (IOC) LEFT DIABETIC 5.51 01:10.7;  Surgeon: BrLeandrew KoyanagiMD;  [facility]  Service: Ophthalmology;  Laterality: Left;   COLONOSCOPY  2006   normal   COLONOSCOPY WITH PROPOFOL N/A 09/06/2015   Procedure: COLONOSCOPY WITH PROPOFOL;  Surgeon: Lucilla Lame, MD;  Location: [facility];  Service: Endoscopy;  Laterality: N/A;   POLYPECTOMY  09/06/2015   Procedure: POLYPECTOMY;  Surgeon: Lucilla Lame, MD;  Location: [facility];  Service: Endoscopy;;   PROSTATE SURGERY     robotic prostatectomy  2006   Family History  Problem Relation Age of Onset   Heart attack Father    Diabetes Mother    Kidney disease Brother    Social History   Socioeconomic History   Marital status: Legally Separated    Spouse  name: Not on file   Number of children: 3   Years of education: Not on file   Highest education level: 12th grade  Occupational History   Occupation: Retired  Tobacco Use   Smoking status: Former    Packs/day: 1.00    Years: 16.00    Total pack years: 16.00    Types: Cigars, Cigarettes   Smokeless tobacco: Current    Types: Snuff   Tobacco comments:    Quit 2007. Smoking cessation materials not required  Vaping Use   Vaping Use: Never used  Substance and Sexual Activity   Alcohol use: Not Currently    Alcohol/week: 0.0 standard drinks of alcohol    Comment: occasional, 1-2 drinks per year   Drug use: No   Sexual activity: Yes  Other Topics Concern   Not on file  Social History Narrative   Lives alone.    Social Determinants of Health   Financial Resource Strain: Low Risk  (11/23/2022)   Overall Financial Resource Strain (CARDIA)    Difficulty of Paying Living Expenses: Not hard at all  Food Insecurity: Food Insecurity Present (11/23/2022)   Hunger Vital Sign    Worried About Running Out of Food in the Last Year: Often true    Ran Out of Food in the Last Year: Often true  Transportation Needs: No Transportation Needs (11/23/2022)   PRAPARE - Hydrologist (Medical): No    Lack of Transportation (Non-Medical): No  Physical Activity: Inactive (11/23/2022)   Exercise Vital Sign    Days of Exercise per Week: 0 days    Minutes of Exercise per Session: 0 min  Stress: No Stress Concern Present (11/23/2022)   [place]    Feeling of Stress : Not at all  Social Connections: Socially Isolated (11/23/2022)   Social Connection and Isolation Panel [NHANES]    Frequency of Communication with Friends and Family: More than three times a week    Frequency of Social Gatherings with Friends and Family: Once a week    Attends Religious Services: Never    Marine scientist or Organizations: No     Attends Archivist Meetings: Never    Marital Status: Separated    Tobacco Counseling Ready to quit: Yes Counseling given: No Tobacco comments: Quit 2007. Smoking cessation materials not required   Clinical Intake:  Pre-visit preparation completed: No  Pain : No/denies pain     Nutritional Status: BMI 25 -29 Overweight Nutritional Risks: None Diabetes: No  How often do you need to have someone help you when you read instructions, pamphlets, or other written materials from your doctor or pharmacy?: 1 - Never  Diabetic?Nutrition Risk Assessment:  Has the patient had any  N/V/D within the last 2 months?  No  Does the patient have any non-healing wounds?  No  Has the patient had any unintentional weight loss or weight gain?  No   Diabetes:  Is the patient diabetic?  Yes  If diabetic, was a CBG obtained today?  No  Did the patient bring in their glucometer from home?  No  How often do you monitor your CBG's? Never .   Financial Strains and Diabetes Management:  Are you having any financial strains with the device, your supplies or your medication? No .  Does the patient want to be seen by Chronic Care Management for management of their diabetes?  No  Would the patient like to be referred to a Nutritionist or for Diabetic Management?  No   Diabetic Exams:  Diabetic Eye Exam: Overdue for diabetic eye exam. Pt has been advised about the importance in completing this exam. Patient advised to call and schedule an eye exam. Diabetic Foot Exam: Completed 04/07/2022    Interpreter Needed?: No  Information entered by :: Donnie Mesa, [place]   Activities of Daily Living    11/23/2022    1:30 PM  In your present state of health, do you have any difficulty performing the following activities:  Hearing? 0  Vision? 1  Comment glasses  Difficulty concentrating or making decisions? 0  Walking or climbing stairs? 1  Dressing or bathing? 0  Doing errands, shopping? 1     Patient Care Team: Juline Patch, MD as PCP - General (Family Medicine) Nori Riis, PA-C as Physician Assistant (Urology)  Indicate any recent Medical Services you may have received from other than [facility] providers in the past year (date may be approximate).    No recent medical services received from other than [facility] providers documented. Assessment:   This is a routine wellness examination for [place].  Hearing/Vision screen Pt denies hearing difficulty Vision Screening - Comments:Due for an Annual vision screenings. Last screening was done at [facility] 05/2021  Dietary issues and exercise activities discussed: Current Exercise Habits: The patient does not participate in regular exercise at present, Exercise limited by: orthopedic condition(s)   Goals Addressed   None    Depression Screen    11/23/2022    1:29 PM 08/10/2022    2:38 PM 04/09/2022    1:31 PM 11/05/2021    1:26 PM 07/16/2021    9:05 AM 03/14/2021    9:38 AM 11/13/2020    9:33 AM  PHQ 2/9 Scores  PHQ - 2 Score 0 0 0 0 0 0 0  PHQ- 9 Score  0 1  0 2 0    Fall Risk    11/23/2022    1:30 PM 08/10/2022    2:37 PM 04/09/2022    1:31 PM 11/05/2021    1:27 PM 07/16/2021    9:05 AM  Fall Risk   Falls in the past year? 0 0 0 0 0  Number falls in past yr: 0 0 0 0 0  Injury with Fall? 0 0 0 0 0  Risk for fall due to : No Fall Risks Impaired balance/gait No Fall Risks No Fall Risks Impaired balance/gait  Follow up Falls evaluation completed Falls evaluation completed;Falls prevention discussed Falls evaluation completed Falls prevention discussed Falls evaluation completed    FALL RISK PREVENTION PERTAINING TO THE HOME:  Any stairs in or around the home? No  If so, are there any without handrails? No  Home free  of loose throw rugs in walkways, pet beds, electrical cords, etc? Yes  Adequate lighting in your home to reduce risk of falls? Yes   ASSISTIVE DEVICES UTILIZED TO PREVENT FALLS:  Life  alert? No  Use of a cane, walker or w/c? Yes  Grab bars in the bathroom? No  Shower chair or bench in shower? Yes  Elevated toilet seat or a handicapped toilet? No   TIMED UP AND GO:  Was the test performed?  unable to perform, virtual telephonic appt  .  Cognitive Function:        11/23/2022    1:31 PM 11/10/2017    9:10 AM  6CIT Screen  What Year? 0 points 0 points  What month? 0 points 0 points  What time? 0 points 3 points  Count back from 20 0 points 0 points  Months in reverse 0 points 0 points  Repeat phrase 2 points 4 points  Total Score 2 points 7 points    Immunizations Immunization History  Administered Date(s) Administered   Moderna Sars-Covid-2 Vaccination 12/29/2019, 01/29/2020   Pneumococcal Conjugate-13 08/20/2015   Pneumococcal Polysaccharide-23 09/25/2013   Tdap 09/25/2013    TDAP status: Up to date  Flu Vaccine status: Due, Education has been provided regarding the importance of this vaccine. Advised may receive this vaccine at local pharmacy or Health Dept. Aware to provide a copy of the vaccination record if obtained from local pharmacy or Health Dept. Verbalized acceptance and understanding.  Pneumococcal vaccine status: Up to date  Covid-19 vaccine status: Information provided on how to obtain vaccines.   Qualifies for Shingles Vaccine? Yes   Zostavax completed No   Shingrix Completed?: No.    Education has been provided regarding the importance of this vaccine. Patient has been advised to call insurance company to determine out of pocket expense if they have not yet received this vaccine. Advised may also receive vaccine at local pharmacy or Health Dept. Verbalized acceptance and understanding.  Screening Tests Health Maintenance  Topic Date Due   Zoster Vaccines- Shingrix (1 of 2) Never done   COVID-19 Vaccine (3 - Moderna risk series) 02/26/2020   OPHTHALMOLOGY EXAM  06/04/2022   INFLUENZA VACCINE  02/14/2023 (Originally 06/16/2022)    HEMOGLOBIN A1C  02/08/2023   Diabetic kidney evaluation - eGFR measurement  04/10/2023   FOOT EXAM  04/10/2023   Diabetic kidney evaluation - Urine ACR  08/11/2023   DTaP/Tdap/Td (2 - Td or Tdap) 09/26/2023   Medicare Annual Wellness (AWV)  11/24/2023   Pneumonia Vaccine 1+ Years old  Completed   Hepatitis C Screening  Completed   HPV VACCINES  Aged Out   COLONOSCOPY (Pts 45-9yr Insurance coverage will need to be confirmed)  Discontinued    Health Maintenance  Health Maintenance Due  Topic Date Due   Zoster Vaccines- Shingrix (1 of 2) Never done   COVID-19 Vaccine (3 - Moderna risk series) 02/26/2020   OPHTHALMOLOGY EXAM  06/04/2022    Colorectal cancer screening: Type of screening: Colonoscopy. Completed 09/06/2015. Repeat every 10 years  Lung Cancer Screening: (Low Dose CT Chest recommended if Age 77-80years, 30 pack-year currently smoking OR have quit w/in 15years.) does not qualify.   Lung Cancer Screening Referral: not applicable   Additional Screening:  Hepatitis C Screening: does qualify; Completed 12/08/2016  Vision Screening: Recommended annual ophthalmology exams for early detection of glaucoma and other disorders of the eye. Is the patient up to date with their annual eye exam?  No  Who is the provider or what is the name of the office in which the patient attends annual eye exams? [facility] 06/04/2021. He said he will call and schedule an appointment when he gets some extra money.  If pt is not established with a provider, would they like to be referred to a provider to establish care? No .   Dental Screening: Recommended annual dental exams for proper oral hygiene  Community Resource Referral / Chronic Care Management: CRR required this visit?  Yes   CCM required this visit?  Yes      Plan:     I have personally reviewed and noted the following in the patient's chart:   Medical and social history Use of alcohol, tobacco or illicit drugs   Current medications and supplements including opioid prescriptions. Patient is not currently taking opioid prescriptions. Functional ability and status Nutritional status Physical activity Advanced directives List of other physicians Hospitalizations, surgeries, and ER visits in previous 12 months Vitals Screenings to include cognitive, depression, and falls Referrals and appointments  In addition, I have reviewed and discussed with patient certain preventive protocols, quality metrics, and best practice recommendations. A written personalized care plan for preventive services as well as general preventive health recommendations were provided to patient.    Mr. Pinard , Thank you for taking time to come for your Medicare Wellness Visit. I appreciate your ongoing commitment to your health goals. Please review the following plan we discussed and let me know if I can assist you in the future.   These are the goals we discussed:  Goals      Exercise 150 min/wk Moderate Activity     Recommend to exercise at least 150 minutes per week        This is a list of the screening recommended for you and due dates:  Health Maintenance  Topic Date Due   Zoster (Shingles) Vaccine (1 of 2) Never done   COVID-19 Vaccine (3 - Moderna risk series) 02/26/2020   Eye exam for diabetics  06/04/2022   Flu Shot  02/14/2023*   Hemoglobin A1C  02/08/2023   Yearly kidney function blood test for diabetes  04/10/2023   Complete foot exam   04/10/2023   Yearly kidney health urinalysis for diabetes  08/11/2023   DTaP/Tdap/Td vaccine (2 - Td or Tdap) 09/26/2023   Medicare Annual Wellness Visit  11/24/2023   Pneumonia Vaccine  Completed   Hepatitis C Screening: USPSTF Recommendation to screen - Ages 18-79 yo.  Completed   HPV Vaccine  Aged Out   Colon Cancer Screening  Discontinued  *Topic was postponed. The date shown is not the original due date.     Wilson Singer, [place]   11/23/2022   Nurse Notes:  Approximately 30 minute Non-Face -To-Face Medicare Wellness Visit

## 2022-11-24 ENCOUNTER — Telehealth: Payer: Self-pay | Admitting: *Deleted

## 2022-11-24 NOTE — Progress Notes (Signed)
  Care Coordination  Outreach Note  11/24/2022 Name: Daniel Kirby MRN: [id number] DOB: 1946-08-27   Care Coordination Outreach Attempts: An unsuccessful telephone outreach was attempted today to offer the patient information about available care coordination services as a benefit of their health plan.   Referral received   Follow Up Plan:  Additional outreach attempts will be made to offer the patient care coordination information and services.   Encounter Outcome:  No Answer  Julian Hy, [place] Direct Dial: [phone]

## 2022-11-27 NOTE — Progress Notes (Unsigned)
  Care Coordination  Outreach Note  11/27/2022 Name: Daniel Kirby MRN: [id number] DOB: 11-06-1946   Care Coordination Outreach Attempts: A second unsuccessful outreach was attempted today to offer the patient with information about available care coordination services as a benefit of their health plan.     Received referral   Follow Up Plan:  Additional outreach attempts will be made to offer the patient care coordination information and services.   Encounter Outcome:  No Answer  Julian Hy, [place] Direct Dial: [phone]

## 2022-12-01 NOTE — Progress Notes (Signed)
  Care Coordination  Outreach Note  12/01/2022 Name: TREVIONNE ADVANI MRN: [id number] DOB: 12/13/1945   Care Coordination Outreach Attempts: A third unsuccessful outreach was attempted today to offer the patient with information about available care coordination services as a benefit of their health plan.   Referral received   Follow Up Plan:  No further outreach attempts will be made at this time. We have been unable to contact the patient to offer or enroll patient in care coordination services  Encounter Outcome:  No Answer  Julian Hy, [place] Direct Dial: [phone]

## 2022-12-10 ENCOUNTER — Ambulatory Visit (INDEPENDENT_AMBULATORY_CARE_PROVIDER_SITE_OTHER): Payer: Medicare PPO | Admitting: Family Medicine

## 2022-12-10 ENCOUNTER — Encounter: Payer: Self-pay | Admitting: Family Medicine

## 2022-12-10 DIAGNOSIS — I10 Essential (primary) hypertension: Secondary | ICD-10-CM | POA: Diagnosis not present

## 2022-12-10 DIAGNOSIS — E782 Mixed hyperlipidemia: Secondary | ICD-10-CM

## 2022-12-10 DIAGNOSIS — E039 Hypothyroidism, unspecified: Secondary | ICD-10-CM | POA: Diagnosis not present

## 2022-12-10 DIAGNOSIS — E119 Type 2 diabetes mellitus without complications: Secondary | ICD-10-CM

## 2022-12-10 MED ORDER — LOSARTAN POTASSIUM-HCTZ 100-25 MG PO TABS: 1.0000 | ORAL_TABLET | Freq: Every day | ORAL | 1 refills | Status: DC

## 2022-12-10 MED ORDER — LEVOTHYROXINE SODIUM 125 MCG PO TABS: 125.0000 ug | ORAL_TABLET | Freq: Every day | ORAL | 5 refills | Status: DC

## 2022-12-10 MED ORDER — GLIMEPIRIDE 2 MG PO TABS: ORAL_TABLET | ORAL | 1 refills | Status: DC

## 2022-12-10 MED ORDER — METFORMIN HCL 500 MG PO TABS: 500.0000 mg | ORAL_TABLET | Freq: Two times a day (BID) | ORAL | 1 refills | Status: DC

## 2022-12-10 MED ORDER — PRAVASTATIN SODIUM 10 MG PO TABS: 10.0000 mg | ORAL_TABLET | Freq: Every day | ORAL | 1 refills | Status: DC

## 2022-12-10 NOTE — Progress Notes (Signed)
Date:  12/10/2022   Name:  Daniel Kirby   DOB:  December 06, 1945   MRN:  [id number]   Chief Complaint: Diabetes  Diabetes He presents for his follow-up diabetic visit. He has type 2 diabetes mellitus. His disease course has been stable. There are no hypoglycemic associated symptoms. Pertinent negatives for hypoglycemia include no dizziness, headaches or nervousness/anxiousness. There are no diabetic associated symptoms. Pertinent negatives for diabetes include no chest pain and no polydipsia. There are no hypoglycemic complications. Symptoms are worsening. There are no diabetic complications. There are no known risk factors for coronary artery disease. Current diabetic treatment includes oral agent (dual therapy). He is following a generally unhealthy diet. Meal planning includes avoidance of concentrated sweets and carbohydrate counting. He rarely participates in exercise.  Hypertension This is a chronic problem. The current episode started more than 1 year ago. The problem has been gradually improving since onset. The problem is controlled. Pertinent negatives include no chest pain, headaches, neck pain, palpitations, peripheral edema or shortness of breath.    Lab Results  Component Value Date   NA 138 04/09/2022   K 3.5 04/09/2022   CO2 24 04/09/2022   GLUCOSE 154 (H) 04/09/2022   BUN 8 04/09/2022   CREATININE 0.87 04/09/2022   CALCIUM 9.6 04/09/2022   EGFR 89 04/09/2022   GFRNONAA >60 07/16/2021   Lab Results  Component Value Date   CHOL 139 07/16/2021   HDL 31 (L) 07/16/2021   LDLCALC 78 07/16/2021   TRIG 150 (H) 07/16/2021   CHOLHDL 4.5 07/16/2021   Lab Results  Component Value Date   TSH 3.780 12/09/2021   Lab Results  Component Value Date   HGBA1C 6.3 (H) 08/10/2022   Lab Results  Component Value Date   WBC 6.1 12/19/2018   HGB 14.5 12/19/2018   HCT 41.0 12/19/2018   MCV 86.5 12/19/2018   PLT 168 12/19/2018   Lab Results  Component Value Date   ALT 13  12/09/2021   AST 13 12/09/2021   ALKPHOS 76 12/09/2021   BILITOT 0.7 12/09/2021   No results found for: "25OHVITD2", "25OHVITD3", "VD25OH"   Review of Systems  Constitutional:  Negative for chills and fever.  HENT:  Negative for drooling, ear discharge, ear pain and sore throat.   Respiratory:  Negative for cough, shortness of breath and wheezing.   Cardiovascular:  Negative for chest pain, palpitations and leg swelling.  Gastrointestinal:  Negative for abdominal pain, blood in stool, constipation, diarrhea and nausea.  Endocrine: Negative for polydipsia.  Genitourinary:  Negative for dysuria, frequency, hematuria and urgency.  Musculoskeletal:  Negative for back pain, myalgias and neck pain.  Skin:  Negative for rash.  Allergic/Immunologic: Negative for environmental allergies.  Neurological:  Negative for dizziness and headaches.  Hematological:  Does not bruise/bleed easily.  Psychiatric/Behavioral:  Negative for suicidal ideas. The patient is not nervous/anxious.     Patient Active Problem List   Diagnosis Date Noted   Type 2 diabetes mellitus without complication, without long-term current use of insulin ([place]) 01/18/2018   Chronic low back pain without sciatica 06/08/2017   Essential hypertension 12/08/2016   Adult hypothyroidism 12/08/2016   Arthritis 12/08/2016   Acute anxiety 12/08/2016   Cervical radiculopathy 12/08/2016   Mixed hyperlipidemia 12/08/2016   Special screening for malignant neoplasms, colon    Benign neoplasm of ascending colon    Major depressive disorder with single episode, in partial remission ([place]) 08/20/2015   Erectile dysfunction 06/04/2015   History  of prostate cancer 06/04/2015    No Known Allergies  Past Surgical History:  Procedure Laterality Date   CATARACT EXTRACTION W/PHACO Left 06/04/2021   Procedure: CATARACT EXTRACTION PHACO AND INTRAOCULAR LENS PLACEMENT (IOC) LEFT DIABETIC 5.51 01:10.7;  Surgeon: Leandrew Koyanagi, MD;   Location: [place];  Service: Ophthalmology;  Laterality: Left;   COLONOSCOPY  2006   normal   COLONOSCOPY WITH PROPOFOL N/A 09/06/2015   Procedure: COLONOSCOPY WITH PROPOFOL;  Surgeon: Lucilla Lame, MD;  Location: [place];  Service: Endoscopy;  Laterality: N/A;   POLYPECTOMY  09/06/2015   Procedure: POLYPECTOMY;  Surgeon: Lucilla Lame, MD;  Location: [place];  Service: Endoscopy;;   PROSTATE SURGERY     robotic prostatectomy  2006    Social History   Tobacco Use   Smoking status: Former    Packs/day: 1.00    Years: 16.00    Total pack years: 16.00    Types: Cigars, Cigarettes   Smokeless tobacco: Current    Types: Snuff   Tobacco comments:    Quit 2007. Smoking cessation materials not required  Vaping Use   Vaping Use: Never used  Substance Use Topics   Alcohol use: Not Currently    Alcohol/week: 0.0 standard drinks of alcohol    Comment: occasional, 1-2 drinks per year   Drug use: No     Medication list has been reviewed and updated.  No outpatient medications have been marked as taking for the 12/10/22 encounter (Office Visit) with Juline Patch, MD.       12/10/2022    2:14 PM 08/10/2022    2:38 PM 04/09/2022    1:31 PM 07/16/2021    9:05 AM  GAD 7 : Generalized Anxiety Score  Nervous, Anxious, on Edge 0 0 0 0  Control/stop worrying 0 0 0 0  Worry too much - different things 0 0 0 0  Trouble relaxing 0 0 0 0  Restless 0 0 0 0  Easily annoyed or irritable 0 1 0 0  Afraid - awful might happen 0 0 0 0  Total GAD 7 Score 0 1 0 0  Anxiety Difficulty Not difficult at all Not difficult at all Not difficult at all        12/10/2022    2:14 PM 11/23/2022    1:29 PM 08/10/2022    2:38 PM  Depression screen PHQ 2/9  Decreased Interest 0 0 0  Down, Depressed, Hopeless 0 0 0  PHQ - 2 Score 0 0 0  Altered sleeping 0  0  Tired, decreased energy 0  0  Change in appetite 0  0  Feeling bad or failure about yourself  0  0  Trouble  concentrating 0  0  Moving slowly or fidgety/restless 0  0  Suicidal thoughts 0  0  PHQ-9 Score 0  0  Difficult doing work/chores Not difficult at all  Not difficult at all    BP Readings from Last 3 Encounters:  12/10/22 (!) 162/84  08/10/22 (!) 150/88  08/03/22 (!) 156/86    Physical Exam Vitals and nursing note reviewed.  HENT:     Head: Normocephalic.     Right Ear: Tympanic membrane and external ear normal.     Left Ear: Tympanic membrane and external ear normal.     Nose: Nose normal.  Eyes:     General: No scleral icterus.       Right eye: No discharge.  Left eye: No discharge.     Conjunctiva/sclera: Conjunctivae normal.     Pupils: Pupils are equal, round, and reactive to light.  Neck:     Thyroid: No thyromegaly.     Vascular: No JVD.     Trachea: No tracheal deviation.  Cardiovascular:     Rate and Rhythm: Normal rate and regular rhythm.     Heart sounds: Normal heart sounds. No murmur heard.    No friction rub. No gallop.  Pulmonary:     Effort: No respiratory distress.     Breath sounds: Normal breath sounds. No wheezing, rhonchi or rales.  Abdominal:     General: Bowel sounds are normal.     Palpations: Abdomen is soft. There is no mass.     Tenderness: There is no abdominal tenderness. There is no guarding or rebound.  Musculoskeletal:        General: No tenderness. Normal range of motion.     Cervical back: Normal range of motion and neck supple.  Lymphadenopathy:     Cervical: No cervical adenopathy.  Skin:    General: Skin is warm.     Findings: No rash.  Neurological:     Mental Status: He is alert and oriented to person, place, and time.     Cranial Nerves: No cranial nerve deficit.     Deep Tendon Reflexes: Reflexes are normal and symmetric.     Wt Readings from Last 3 Encounters:  12/10/22 237 lb (107.5 kg)  08/10/22 219 lb (99.3 kg)  08/03/22 225 lb (102.1 kg)    BP (!) 162/84 (BP Location: Right Arm, Cuff Size: Large)    Pulse 94   Ht '5\' 7"'$  (1.702 m)   Wt 237 lb (107.5 kg)   SpO2 97%   BMI 37.12 kg/m   Assessment and Plan:  1. Type 2 diabetes mellitus without complication, without long-term current use of insulin (HCC) Chronic.  Controlled.  Stable.  Patient will continue Amaryl 2 mg once a day and metformin 500 mg twice a day will recheck in 4 weeks with A1c - glimepiride (AMARYL) 2 MG tablet; One a day  Dispense: 90 tablet; Refill: 1 - metFORMIN (GLUCOPHAGE) 500 MG tablet; Take 1 tablet (500 mg total) by mouth 2 (two) times daily with a meal.  Dispense: 180 tablet; Refill: 1  2. Adult hypothyroidism Chronic.  Controlled.  Stable.  Continue supplementation with levothyroxine 125 mcg daily.  Will recheck in 4 weeks.  With TSH - levothyroxine (EUTHYROX) 125 MCG tablet; Take 1 tablet (125 mcg total) by mouth daily.  Dispense: 30 tablet; Refill: 5  3. Essential hypertension Chronic.  Uncontrolled.  Stable.  Patient did not pick up medication in December.  Blood pressure is elevated at 162/84.  Continue with losartan hydrochlorothiazide patient will pick up today and initiate again and we will recheck blood pressure in 4 weeks. - losartan-hydrochlorothiazide (HYZAAR) 100-25 MG tablet; Take 1 tablet by mouth daily.  Dispense: 90 tablet; Refill: 1  4. Mixed hyperlipidemia Chronic.  Controlled.  Stable.  Patient states that he only eats 1 meal a day.  Will continue pravastatin 10 mg daily.  Will recheck in 4 weeks. - pravastatin (PRAVACHOL) 10 MG tablet; Take 1 tablet (10 mg total) by mouth daily.  Dispense: 90 tablet; Refill: 1    Otilio Miu, MD

## 2023-01-07 ENCOUNTER — Ambulatory Visit: Payer: Medicare PPO | Admitting: Family Medicine

## 2023-01-14 ENCOUNTER — Encounter: Payer: Self-pay | Admitting: Family Medicine

## 2023-01-14 ENCOUNTER — Ambulatory Visit (INDEPENDENT_AMBULATORY_CARE_PROVIDER_SITE_OTHER): Payer: Medicare PPO | Admitting: Family Medicine

## 2023-01-14 VITALS — BP 136/74 | HR 74 | Ht 67.0 in | Wt 226.0 lb

## 2023-01-14 DIAGNOSIS — E782 Mixed hyperlipidemia: Secondary | ICD-10-CM

## 2023-01-14 DIAGNOSIS — I10 Essential (primary) hypertension: Secondary | ICD-10-CM | POA: Diagnosis not present

## 2023-01-14 DIAGNOSIS — E119 Type 2 diabetes mellitus without complications: Secondary | ICD-10-CM | POA: Diagnosis not present

## 2023-01-14 DIAGNOSIS — E039 Hypothyroidism, unspecified: Secondary | ICD-10-CM

## 2023-01-14 NOTE — Progress Notes (Signed)
Date:  01/14/2023   Name:  Daniel Kirby   DOB:  Sep 20, 1946   MRN:  [id number]   Chief Complaint: Diabetes and Hypertension  Diabetes He presents for his follow-up diabetic visit. He has type 2 diabetes mellitus. His disease course has been stable. There are no hypoglycemic associated symptoms. Pertinent negatives for hypoglycemia include no dizziness, headaches or nervousness/anxiousness. There are no diabetic associated symptoms. Pertinent negatives for diabetes include no blurred vision, no chest pain, no fatigue and no polydipsia. There are no hypoglycemic complications. Symptoms are stable. There are no diabetic complications. There are no known risk factors for coronary artery disease. Current diabetic treatment includes oral agent (dual therapy). He is compliant with treatment all of the time. He is following a generally healthy diet. Meal planning includes avoidance of concentrated sweets and carbohydrate counting. He participates in exercise intermittently. An ACE inhibitor/angiotensin II receptor blocker is being taken. Eye exam is not current.  Hypertension This is a chronic problem. The current episode started more than 1 year ago. The problem has been gradually improving since onset. The problem is controlled. Pertinent negatives include no blurred vision, chest pain, headaches, neck pain, palpitations or shortness of breath. Past treatments include angiotensin blockers and diuretics. The current treatment provides moderate improvement. There are no compliance problems.  Identifiable causes of hypertension include a thyroid problem.  Thyroid Problem Presents for follow-up visit. Patient reports no anxiety, cold intolerance, constipation, diarrhea, fatigue, heat intolerance, palpitations or weight gain. The symptoms have been stable. His past medical history is significant for hyperlipidemia.  Hyperlipidemia This is a chronic problem. The current episode started more than 1 year ago. The  problem is controlled. Recent lipid tests were reviewed and are normal. Pertinent negatives include no chest pain, myalgias or shortness of breath. Current antihyperlipidemic treatment includes statins. The current treatment provides moderate improvement of lipids.    Lab Results  Component Value Date   NA 138 04/09/2022   K 3.5 04/09/2022   CO2 24 04/09/2022   GLUCOSE 154 (H) 04/09/2022   BUN 8 04/09/2022   CREATININE 0.87 04/09/2022   CALCIUM 9.6 04/09/2022   EGFR 89 04/09/2022   GFRNONAA >60 07/16/2021   Lab Results  Component Value Date   CHOL 139 07/16/2021   HDL 31 (L) 07/16/2021   LDLCALC 78 07/16/2021   TRIG 150 (H) 07/16/2021   CHOLHDL 4.5 07/16/2021   Lab Results  Component Value Date   TSH 3.780 12/09/2021   Lab Results  Component Value Date   HGBA1C 6.3 (H) 08/10/2022   Lab Results  Component Value Date   WBC 6.1 12/19/2018   HGB 14.5 12/19/2018   HCT 41.0 12/19/2018   MCV 86.5 12/19/2018   PLT 168 12/19/2018   Lab Results  Component Value Date   ALT 13 12/09/2021   AST 13 12/09/2021   ALKPHOS 76 12/09/2021   BILITOT 0.7 12/09/2021   No results found for: "25OHVITD2", "25OHVITD3", "VD25OH"   Review of Systems  Constitutional:  Negative for chills, fatigue, fever and weight gain.  HENT:  Negative for drooling, ear discharge, ear pain and sore throat.   Eyes:  Negative for blurred vision.  Respiratory:  Negative for cough, shortness of breath and wheezing.   Cardiovascular:  Negative for chest pain, palpitations and leg swelling.  Gastrointestinal:  Negative for abdominal pain, blood in stool, constipation, diarrhea and nausea.  Endocrine: Negative for cold intolerance, heat intolerance and polydipsia.  Genitourinary:  Negative for dysuria,  frequency, hematuria and urgency.  Musculoskeletal:  Negative for back pain, myalgias and neck pain.  Skin:  Negative for rash.  Allergic/Immunologic: Negative for environmental allergies.  Neurological:   Negative for dizziness and headaches.  Hematological:  Does not bruise/bleed easily.  Psychiatric/Behavioral:  Negative for suicidal ideas. The patient is not nervous/anxious.     Patient Active Problem List   Diagnosis Date Noted   Type 2 diabetes mellitus without complication, without long-term current use of insulin ([place]) 01/18/2018   Chronic low back pain without sciatica 06/08/2017   Essential hypertension 12/08/2016   Adult hypothyroidism 12/08/2016   Arthritis 12/08/2016   Acute anxiety 12/08/2016   Cervical radiculopathy 12/08/2016   Mixed hyperlipidemia 12/08/2016   Special screening for malignant neoplasms, colon    Benign neoplasm of ascending colon    Major depressive disorder with single episode, in partial remission ([place]) 08/20/2015   Erectile dysfunction 06/04/2015   History of prostate cancer 06/04/2015    No Known Allergies  Past Surgical History:  Procedure Laterality Date   CATARACT EXTRACTION W/PHACO Left 06/04/2021   Procedure: CATARACT EXTRACTION PHACO AND INTRAOCULAR LENS PLACEMENT (IOC) LEFT DIABETIC 5.51 01:10.7;  Surgeon: Leandrew Koyanagi, MD;  Location: [place];  Service: Ophthalmology;  Laterality: Left;   COLONOSCOPY  2006   normal   COLONOSCOPY WITH PROPOFOL N/A 09/06/2015   Procedure: COLONOSCOPY WITH PROPOFOL;  Surgeon: Lucilla Lame, MD;  Location: [place];  Service: Endoscopy;  Laterality: N/A;   POLYPECTOMY  09/06/2015   Procedure: POLYPECTOMY;  Surgeon: Lucilla Lame, MD;  Location: [place];  Service: Endoscopy;;   PROSTATE SURGERY     robotic prostatectomy  2006    Social History   Tobacco Use   Smoking status: Former    Packs/day: 1.00    Years: 16.00    Total pack years: 16.00    Types: Cigars, Cigarettes   Smokeless tobacco: Current    Types: Snuff   Tobacco comments:    Quit 2007. Smoking cessation materials not required  Vaping Use   Vaping Use: Never used  Substance Use Topics   Alcohol  use: Not Currently    Alcohol/week: 0.0 standard drinks of alcohol    Comment: occasional, 1-2 drinks per year   Drug use: No     Medication list has been reviewed and updated.  Current Meds  Medication Sig   glimepiride (AMARYL) 2 MG tablet One a day   levothyroxine (EUTHYROX) 125 MCG tablet Take 1 tablet (125 mcg total) by mouth daily.   losartan-hydrochlorothiazide (HYZAAR) 100-25 MG tablet Take 1 tablet by mouth daily.   metFORMIN (GLUCOPHAGE) 500 MG tablet Take 1 tablet (500 mg total) by mouth 2 (two) times daily with a meal.   MYRBETRIQ 25 MG TB24 tablet Take 1 tablet by mouth once daily   NONFORMULARY OR COMPOUNDED ITEM Trimix (30/1/10)-(Pap/Phent/PGE)  Dosage: Inject 0.5 cc per injection   Vial 58m  Qty #10 Refills 6  [phone] [phone]  pravastatin (PRAVACHOL) 10 MG tablet Take 1 tablet (10 mg total) by mouth daily.       01/14/2023    2:32 PM 12/10/2022    2:14 PM 08/10/2022    2:38 PM 04/09/2022    1:31 PM  GAD 7 : Generalized Anxiety Score  Nervous, Anxious, on Edge 0 0 0 0  Control/stop worrying 0 0 0 0  Worry too much - different things 0 0 0 0  Trouble relaxing 0 0 0  0  Restless 0 0 0 0  Easily annoyed or irritable 0 0 1 0  Afraid - awful might happen 0 0 0 0  Total GAD 7 Score 0 0 1 0  Anxiety Difficulty Not difficult at all Not difficult at all Not difficult at all Not difficult at all       01/14/2023    2:32 PM 12/10/2022    2:14 PM 11/23/2022    1:29 PM  Depression screen PHQ 2/9  Decreased Interest 0 0 0  Down, Depressed, Hopeless 0 0 0  PHQ - 2 Score 0 0 0  Altered sleeping 0 0   Tired, decreased energy 0 0   Change in appetite 0 0   Feeling bad or failure about yourself  0 0   Trouble concentrating 0 0   Moving slowly or fidgety/restless 0 0   Suicidal thoughts 0 0   PHQ-9 Score 0 0   Difficult doing work/chores Not difficult at all Not difficult at all     BP Readings from Last 3 Encounters:   01/14/23 136/74  12/10/22 (!) 162/84  08/10/22 (!) 150/88    Physical Exam Vitals and nursing note reviewed.  HENT:     Head: Normocephalic.     Right Ear: Tympanic membrane and external ear normal.     Left Ear: Tympanic membrane and external ear normal.     Nose: Nose normal.     Mouth/Throat:     Mouth: Mucous membranes are dry.  Eyes:     General: No scleral icterus.       Right eye: No discharge.        Left eye: No discharge.     Conjunctiva/sclera: Conjunctivae normal.     Pupils: Pupils are equal, round, and reactive to light.  Neck:     Thyroid: No thyromegaly.     Vascular: No JVD.     Trachea: No tracheal deviation.  Cardiovascular:     Rate and Rhythm: Normal rate and regular rhythm.     Heart sounds: Normal heart sounds. No murmur heard.    No friction rub. No gallop.  Pulmonary:     Effort: No respiratory distress.     Breath sounds: Normal breath sounds. No wheezing, rhonchi or rales.  Abdominal:     General: Bowel sounds are normal.     Palpations: Abdomen is soft. There is no mass.     Tenderness: There is no abdominal tenderness. There is no guarding or rebound.  Musculoskeletal:        General: No tenderness. Normal range of motion.     Cervical back: Normal range of motion and neck supple.  Lymphadenopathy:     Cervical: No cervical adenopathy.  Skin:    General: Skin is warm.     Findings: No rash.  Neurological:     Mental Status: He is alert and oriented to person, place, and time.     Cranial Nerves: No cranial nerve deficit.     Deep Tendon Reflexes: Reflexes are normal and symmetric.     Wt Readings from Last 3 Encounters:  01/14/23 226 lb (102.5 kg)  12/10/22 237 lb (107.5 kg)  08/10/22 219 lb (99.3 kg)    BP 136/74 (BP Location: Right Arm, Cuff Size: Large)   Pulse 74   Ht '5\' 7"'$  (1.702 m)   Wt 226 lb (102.5 kg)   SpO2 98%   BMI 35.40 kg/m   Assessment and Plan: 1. Type 2  diabetes mellitus without complication, without  long-term current use of insulin (HCC) Chronic.  Controlled.  Stable.  Continue metformin 500 mg twice a day and glimepiride in 2 mg once a day.  Will recheck patient in 4 months.  We will check A1c for current level of control and encourage patient to follow-up with his ophthalmologic evaluation. - HgB A1c - Ambulatory referral to Ophthalmology  2. Adult hypothyroidism Chronic.  Controlled.  Stable.  Continue current dosing of levothyroxine 125 mcg daily.  Will check TSH for current level of control. - TSH  3. Essential hypertension Chronic.  Controlled.  Stable.  Blood pressure 136/74.  Asymptomatic.  Tolerating medication well.  Will continue losartan hydrochlorothiazide 100-25 mg daily.  Will recheck in 4 months. - Comprehensive Metabolic Panel (CMET)  4. Mixed hyperlipidemia Chronic.  Controlled.  Stable.  Continue pravastatin 10 mg once a day.  Will check lipid panel for the current level of LDL control. - Lipid Panel With LDL/HDL Ratio     Otilio Miu, MD

## 2023-01-15 LAB — COMPREHENSIVE METABOLIC PANEL
ALT: 21 IU/L (ref 0–44)
AST: 10 IU/L (ref 0–40)
Albumin/Globulin Ratio: 1.6 (ref 1.2–2.2)
Albumin: 4.5 g/dL (ref 3.8–4.8)
Alkaline Phosphatase: 81 IU/L (ref 44–121)
BUN/Creatinine Ratio: 13 (ref 10–24)
BUN: 14 mg/dL (ref 8–27)
Bilirubin Total: 0.5 mg/dL (ref 0.0–1.2)
CO2: 23 mmol/L (ref 20–29)
Calcium: 10.2 mg/dL (ref 8.6–10.2)
Chloride: 95 mmol/L — ABNORMAL LOW (ref 96–106)
Creatinine, Ser: 1.09 mg/dL (ref 0.76–1.27)
Globulin, Total: 2.8 g/dL (ref 1.5–4.5)
Glucose: 134 mg/dL — ABNORMAL HIGH (ref 70–99)
Potassium: 3.4 mmol/L — ABNORMAL LOW (ref 3.5–5.2)
Sodium: 135 mmol/L (ref 134–144)
Total Protein: 7.3 g/dL (ref 6.0–8.5)
eGFR: 70 mL/min/{1.73_m2} (ref >59)

## 2023-01-15 LAB — HEMOGLOBIN A1C
Est. average glucose Bld gHb Est-mCnc: 151 mg/dL
Hgb A1c MFr Bld: 6.9 % — ABNORMAL HIGH (ref 4.8–5.6)

## 2023-01-15 LAB — LIPID PANEL WITH LDL/HDL RATIO
Cholesterol, Total: 116 mg/dL (ref 100–199)
HDL: 31 mg/dL — ABNORMAL LOW (ref >39)
LDL Chol Calc (NIH): 56 mg/dL (ref 0–99)
LDL/HDL Ratio: 1.8 ratio (ref 0.0–3.6)
Triglycerides: 170 mg/dL — ABNORMAL HIGH (ref 0–149)
VLDL Cholesterol Cal: 29 mg/dL (ref 5–40)

## 2023-01-15 LAB — TSH: TSH: 1.28 u[IU]/mL (ref 0.450–4.500)

## 2023-01-19 ENCOUNTER — Telehealth: Payer: Self-pay | Admitting: Family Medicine

## 2023-01-19 ENCOUNTER — Other Ambulatory Visit: Payer: Self-pay

## 2023-01-19 DIAGNOSIS — E876 Hypokalemia: Secondary | ICD-10-CM

## 2023-01-19 MED ORDER — POTASSIUM CHLORIDE CRYS ER 10 MEQ PO TBCR: 10.0000 meq | EXTENDED_RELEASE_TABLET | Freq: Every day | ORAL | 1 refills | Status: DC

## 2023-01-19 NOTE — Telephone Encounter (Signed)
Copied from [place] [id number]. Topic: General - Other >> Jan 19, 2023 11:08 AM Cyndi Bender wrote: Reason for CRM: Pt stated [facility] called and told him that he needed to take potassium and he wanted to know if a Rx would be sent to his pharmacy. Cb# [phone]

## 2023-04-13 ENCOUNTER — Ambulatory Visit: Payer: Medicare PPO | Admitting: Family Medicine

## 2023-04-19 ENCOUNTER — Ambulatory Visit: Payer: Medicare PPO | Admitting: Family Medicine

## 2023-04-27 ENCOUNTER — Ambulatory Visit: Payer: Medicare PPO | Admitting: Family Medicine

## 2023-05-03 ENCOUNTER — Ambulatory Visit: Payer: Medicare PPO | Admitting: Family Medicine

## 2023-05-03 ENCOUNTER — Telehealth: Payer: Self-pay | Admitting: Family Medicine

## 2023-05-03 NOTE — Telephone Encounter (Signed)
Called Patient and left voicemail for Patient to call and reschedule appointment or to give [place] a call if he has found a new Primary Care Doctor.

## 2023-05-04 ENCOUNTER — Encounter: Payer: Self-pay | Admitting: Family Medicine

## 2023-05-14 ENCOUNTER — Ambulatory Visit (INDEPENDENT_AMBULATORY_CARE_PROVIDER_SITE_OTHER): Payer: Medicare PPO | Admitting: Family Medicine

## 2023-05-14 ENCOUNTER — Encounter: Payer: Self-pay | Admitting: Family Medicine

## 2023-05-14 ENCOUNTER — Other Ambulatory Visit: Payer: Self-pay | Admitting: Urology

## 2023-05-14 VITALS — BP 128/72 | HR 110 | Ht 67.0 in | Wt 215.0 lb

## 2023-05-14 DIAGNOSIS — N3941 Urge incontinence: Secondary | ICD-10-CM

## 2023-05-14 DIAGNOSIS — Z7984 Long term (current) use of oral hypoglycemic drugs: Secondary | ICD-10-CM

## 2023-05-14 DIAGNOSIS — E119 Type 2 diabetes mellitus without complications: Secondary | ICD-10-CM

## 2023-05-14 MED ORDER — MIRABEGRON ER 25 MG PO TB24
25.0000 mg | ORAL_TABLET | Freq: Every day | ORAL | 3 refills | Status: AC
Start: 2023-05-14 — End: ?

## 2023-05-14 MED ORDER — GLIMEPIRIDE 2 MG PO TABS: ORAL_TABLET | ORAL | 1 refills | Status: DC

## 2023-05-14 MED ORDER — METFORMIN HCL 500 MG PO TABS: 500.0000 mg | ORAL_TABLET | Freq: Two times a day (BID) | ORAL | 1 refills | Status: DC

## 2023-05-14 NOTE — Progress Notes (Signed)
Date:  05/14/2023   Name:  Daniel Kirby   DOB:  31-Dec-1945   MRN:  [id number]   Chief Complaint: Diabetes  Diabetes He presents for his follow-up diabetic visit. He has type 2 diabetes mellitus. His disease course has been stable. Pertinent negatives for hypoglycemia include no confusion, headaches or pallor. There are no diabetic associated symptoms. Pertinent negatives for diabetes include no chest pain and no fatigue. There are no hypoglycemic complications. Symptoms are stable. Current diabetic treatment includes oral agent (dual therapy). He is compliant with treatment most of the time. He is following a generally healthy diet. Meal planning includes avoidance of concentrated sweets and carbohydrate counting. He participates in exercise daily. There is no change in his home blood glucose trend. An ACE inhibitor/angiotensin II receptor blocker is being taken.    Lab Results  Component Value Date   NA 135 01/14/2023   K 3.4 (L) 01/14/2023   CO2 23 01/14/2023   GLUCOSE 134 (H) 01/14/2023   BUN 14 01/14/2023   CREATININE 1.09 01/14/2023   CALCIUM 10.2 01/14/2023   EGFR 70 01/14/2023   GFRNONAA >60 07/16/2021   Lab Results  Component Value Date   CHOL 116 01/14/2023   HDL 31 (L) 01/14/2023   LDLCALC 56 01/14/2023   TRIG 170 (H) 01/14/2023   CHOLHDL 4.5 07/16/2021   Lab Results  Component Value Date   TSH 1.280 01/14/2023   Lab Results  Component Value Date   HGBA1C 6.9 (H) 01/14/2023   Lab Results  Component Value Date   WBC 6.1 12/19/2018   HGB 14.5 12/19/2018   HCT 41.0 12/19/2018   MCV 86.5 12/19/2018   PLT 168 12/19/2018   Lab Results  Component Value Date   ALT 21 01/14/2023   AST 10 01/14/2023   ALKPHOS 81 01/14/2023   BILITOT 0.5 01/14/2023   No results found for: "25OHVITD2", "25OHVITD3", "VD25OH"   Review of Systems  Constitutional:  Negative for fatigue and fever.  Respiratory:  Negative for shortness of breath and wheezing.   Cardiovascular:   Positive for palpitations. Negative for chest pain and leg swelling.  Skin:  Negative for pallor.  Neurological:  Negative for headaches.  Psychiatric/Behavioral:  Negative for agitation, confusion and decreased concentration.     Patient Active Problem List   Diagnosis Date Noted   Type 2 diabetes mellitus without complication, without long-term current use of insulin (HCC) 01/18/2018   Chronic low back pain without sciatica 06/08/2017   Essential hypertension 12/08/2016   Adult hypothyroidism 12/08/2016   Arthritis 12/08/2016   Acute anxiety 12/08/2016   Cervical radiculopathy 12/08/2016   Mixed hyperlipidemia 12/08/2016   Special screening for malignant neoplasms, colon    Benign neoplasm of ascending colon    Major depressive disorder with single episode, in partial remission (HCC) 08/20/2015   Erectile dysfunction 06/04/2015   History of prostate cancer 06/04/2015    No Known Allergies  Past Surgical History:  Procedure Laterality Date   CATARACT EXTRACTION W/PHACO Left 06/04/2021   Procedure: CATARACT EXTRACTION PHACO AND INTRAOCULAR LENS PLACEMENT (IOC) LEFT DIABETIC 5.51 01:10.7;  Surgeon: Lockie Mola, MD;  Location: [facility];  Service: Ophthalmology;  Laterality: Left;   COLONOSCOPY  2006   normal   COLONOSCOPY WITH PROPOFOL N/A 09/06/2015   Procedure: COLONOSCOPY WITH PROPOFOL;  Surgeon: Midge Minium, MD;  Location: [facility];  Service: Endoscopy;  Laterality: N/A;   POLYPECTOMY  09/06/2015   Procedure: POLYPECTOMY;  Surgeon: Midge Minium,  MD;  Location: [facility];  Service: Endoscopy;;   PROSTATE SURGERY     robotic prostatectomy  2006    Social History   Tobacco Use   Smoking status: Former    Packs/day: 1.00    Years: 16.00    Additional pack years: 0.00    Total pack years: 16.00    Types: Cigars, Cigarettes   Smokeless tobacco: Current    Types: Snuff   Tobacco comments:    Quit 2007. Smoking cessation materials  not required  Vaping Use   Vaping Use: Never used  Substance Use Topics   Alcohol use: Not Currently    Alcohol/week: 0.0 standard drinks of alcohol    Comment: occasional, 1-2 drinks per year   Drug use: No     Medication list has been reviewed and updated.  Current Meds  Medication Sig   glimepiride (AMARYL) 2 MG tablet One a day   levothyroxine (EUTHYROX) 125 MCG tablet Take 1 tablet (125 mcg total) by mouth daily.   losartan-hydrochlorothiazide (HYZAAR) 100-25 MG tablet Take 1 tablet by mouth daily.   metFORMIN (GLUCOPHAGE) 500 MG tablet Take 1 tablet (500 mg total) by mouth 2 (two) times daily with a meal.   MYRBETRIQ 25 MG TB24 tablet Take 1 tablet by mouth once daily   NONFORMULARY OR COMPOUNDED ITEM Trimix (30/1/10)-(Pap/Phent/PGE)  Dosage: Inject 0.5 cc per injection   Vial 1ml  Qty #10 Refills 6  [facility] [phone] Fax [phone]   potassium chloride (KLOR-CON M) 10 MEQ tablet Take 1 tablet (10 mEq total) by mouth daily.   pravastatin (PRAVACHOL) 10 MG tablet Take 1 tablet (10 mg total) by mouth daily.       01/14/2023    2:32 PM 12/10/2022    2:14 PM 08/10/2022    2:38 PM 04/09/2022    1:31 PM  GAD 7 : Generalized Anxiety Score  Nervous, Anxious, on Edge 0 0 0 0  Control/stop worrying 0 0 0 0  Worry too much - different things 0 0 0 0  Trouble relaxing 0 0 0 0  Restless 0 0 0 0  Easily annoyed or irritable 0 0 1 0  Afraid - awful might happen 0 0 0 0  Total GAD 7 Score 0 0 1 0  Anxiety Difficulty Not difficult at all Not difficult at all Not difficult at all Not difficult at all       01/14/2023    2:32 PM 12/10/2022    2:14 PM 11/23/2022    1:29 PM  Depression screen PHQ 2/9  Decreased Interest 0 0 0  Down, Depressed, Hopeless 0 0 0  PHQ - 2 Score 0 0 0  Altered sleeping 0 0   Tired, decreased energy 0 0   Change in appetite 0 0   Feeling bad or failure about yourself  0 0   Trouble concentrating 0 0   Moving slowly or  fidgety/restless 0 0   Suicidal thoughts 0 0   PHQ-9 Score 0 0   Difficult doing work/chores Not difficult at all Not difficult at all     BP Readings from Last 3 Encounters:  05/14/23 128/72  01/14/23 136/74  12/10/22 (!) 162/84    Physical Exam Constitutional:      Appearance: Normal appearance.  HENT:     Head: Normocephalic.     Right Ear: Tympanic membrane and ear canal normal.     Left Ear: Tympanic membrane normal.  Nose: Nose normal.  Eyes:     Pupils: Pupils are equal, round, and reactive to light.  Cardiovascular:     Rate and Rhythm: Normal rate and regular rhythm.     Heart sounds: Normal heart sounds. No murmur heard.    No gallop.  Pulmonary:     Breath sounds: No wheezing, rhonchi or rales.  Abdominal:     General: Abdomen is flat.  Neurological:     Mental Status: He is alert.     Wt Readings from Last 3 Encounters:  05/14/23 215 lb (97.5 kg)  01/14/23 226 lb (102.5 kg)  12/10/22 237 lb (107.5 kg)    BP 128/72   Pulse (!) 110   Ht 5\' 7"  (1.702 m)   Wt 215 lb (97.5 kg)   SpO2 97%   BMI 33.67 kg/m   Assessment and Plan:  1. Type 2 diabetes mellitus without complication, without long-term current use of insulin (HCC) Chronic.  Uncontrolled.  Stable.  Patient has been unable to return for appointments due to control concerns.  We will refill medication and have patient return in 4 to 6 weeks at that time we will obtain lab work including renal function panel and A1c.   Elizabeth Sauer, MD

## 2023-08-01 ENCOUNTER — Other Ambulatory Visit: Payer: Self-pay | Admitting: Urology

## 2023-08-01 DIAGNOSIS — C61 Malignant neoplasm of prostate: Secondary | ICD-10-CM

## 2023-08-01 NOTE — Progress Notes (Deleted)
08/02/2023 10:29 PM   Daniel Kirby 11-15-1946 [id number]  Referring provider: Duanne Limerick, MD [address] [place],  [place] [address]  Urological history: 1. Prostate cancer -PSA pending -s/p RPP > 10 years ago  2. UI -contributing factors of age, prostate cancer, pelvic surgery, sugary drinks, diabetes, depression and anxiety -Myrbetriq 25 mg daily   3. ED -contributing factors af age, prostate cancer, pelvic surgery, diabetes, HLD, HTN, hypothyroidism and smoking history -managed with ICI  No chief complaint on file.  HPI: Daniel Kirby is a 77 y.o. male who presents today for a yearly visit.      Previous records reviewed.   PVR ***  PMH: Past Medical History:  Diagnosis Date   Abdominal pain    RUQ   Arthritis    right ankle   ED (erectile dysfunction)    HLD (hyperlipidemia)    Hypertension    Hypothyroid    Prostate cancer (HCC)    Vertigo    1 episode only, approx 1 month ago    Surgical History: Past Surgical History:  Procedure Laterality Date   CATARACT EXTRACTION W/PHACO Left 06/04/2021   Procedure: CATARACT EXTRACTION PHACO AND INTRAOCULAR LENS PLACEMENT (IOC) LEFT DIABETIC 5.51 01:10.7;  Surgeon: Lockie Mola, MD;  Location: [facility];  Service: Ophthalmology;  Laterality: Left;   COLONOSCOPY  2006   normal   COLONOSCOPY WITH PROPOFOL N/A 09/06/2015   Procedure: COLONOSCOPY WITH PROPOFOL;  Surgeon: Midge Minium, MD;  Location: [facility];  Service: Endoscopy;  Laterality: N/A;   POLYPECTOMY  09/06/2015   Procedure: POLYPECTOMY;  Surgeon: Midge Minium, MD;  Location: [facility];  Service: Endoscopy;;   PROSTATE SURGERY     robotic prostatectomy  2006    Home Medications:  Allergies as of 08/02/2023   No Known Allergies      Medication List        Accurate as of August 01, 2023 10:29 PM. If you have any questions, ask your nurse or doctor.          glimepiride 2 MG  tablet Commonly known as: AMARYL One a day   levothyroxine 125 MCG tablet Commonly known as: Euthyrox Take 1 tablet (125 mcg total) by mouth daily.   losartan-hydrochlorothiazide 100-25 MG tablet Commonly known as: HYZAAR Take 1 tablet by mouth daily.   metFORMIN 500 MG tablet Commonly known as: GLUCOPHAGE Take 1 tablet (500 mg total) by mouth 2 (two) times daily with a meal.   mirabegron ER 25 MG Tb24 tablet Commonly known as: Myrbetriq Take 1 tablet (25 mg total) by mouth daily.   NONFORMULARY OR COMPOUNDED ITEM Trimix (30/1/10)-(Pap/Phent/PGE)  Dosage: Inject 0.5 cc per injection   Vial 1ml  Qty #10 Refills 6  [facility] [phone] Fax [phone]   potassium chloride 10 MEQ tablet Commonly known as: KLOR-CON M Take 1 tablet (10 mEq total) by mouth daily.   pravastatin 10 MG tablet Commonly known as: PRAVACHOL Take 1 tablet (10 mg total) by mouth daily.        Allergies: No Known Allergies  Family History: Family History  Problem Relation Age of Onset   Heart attack Father    Diabetes Mother    Kidney disease Brother     Social History:  reports that he has quit smoking. His smoking use included cigars and cigarettes. He has a 16 pack-year smoking history. His smokeless tobacco use includes snuff. He reports that he does not currently  use alcohol. He reports that he does not use drugs.  ROS: For pertinent review of systems please refer to history of present illness  Physical Exam: There were no vitals taken for this visit.  Constitutional:  Well nourished. Alert and oriented, No acute distress. HEENT: [place] AT, moist mucus membranes.  Trachea midline Cardiovascular: No clubbing, cyanosis, or edema. Respiratory: Normal respiratory effort, no increased work of breathing. Neurologic: Grossly intact, no focal deficits, moving all 4 extremities. Psychiatric: Normal mood and affect.   Laboratory Data: Lab Results  Component Value Date    CREATININE 1.09 01/14/2023   Lab Results  Component Value Date   HGBA1C 6.9 (H) 01/14/2023  I have reviewed the labs.  Pertinent Imaging  08/03/22 14:09  Scan Result 34ml     Assessment & Plan:    1. Urge incontinence -minimal bother -switch to Myrbetriq 25 mg daily for better compliance -BLADDER SCAN AMB NON-IMAGING  2. Erectile dysfunction -in a new relationship -continue Trimix 30/1/10, 0.5 cc   3. Prostate cancer -PSA pending    No follow-ups on file.  [name]  [facility] [address] [place], [place] [address] [phone]

## 2023-08-02 ENCOUNTER — Ambulatory Visit: Payer: Medicare PPO | Admitting: Urology

## 2023-08-02 DIAGNOSIS — N3941 Urge incontinence: Secondary | ICD-10-CM

## 2023-08-02 DIAGNOSIS — C61 Malignant neoplasm of prostate: Secondary | ICD-10-CM

## 2023-08-02 DIAGNOSIS — N5231 Erectile dysfunction following radical prostatectomy: Secondary | ICD-10-CM

## 2023-08-15 NOTE — Progress Notes (Unsigned)
08/16/2023 9:51 PM   Daniel Kirby [birth year] [id number]  Referring provider: Duanne Limerick, MD [address] [place],  [place] [address]  Urological history: 1. Prostate cancer -PSA pending -s/p RPP > 10 years ago  2. UI -contributing factors of age, prostate cancer, pelvic surgery, sugary drinks, diabetes, depression and anxiety -Myrbetriq 25 mg daily   3. ED -contributing factors af age, prostate cancer, pelvic surgery, diabetes, HLD, HTN, hypothyroidism and smoking history -managed with ICI  No chief complaint on file.  HPI: Daniel Kirby is a 77 y.o. male who presents today for a yearly visit.      Previous records reviewed.   PVR demonstrates adequate emptying  PMH: Past Medical History:  Diagnosis Date   Abdominal pain    RUQ   Arthritis    right ankle   ED (erectile dysfunction)    HLD (hyperlipidemia)    Hypertension    Hypothyroid    Prostate cancer (HCC)    Vertigo    1 episode only, approx 1 month ago    Surgical History: Past Surgical History:  Procedure Laterality Date   CATARACT EXTRACTION W/PHACO Left 06/04/2021   Procedure: CATARACT EXTRACTION PHACO AND INTRAOCULAR LENS PLACEMENT (IOC) LEFT DIABETIC 5.51 01:10.7;  Surgeon: Lockie Mola, MD;  Location: [facility];  Service: Ophthalmology;  Laterality: Left;   COLONOSCOPY  2006   normal   COLONOSCOPY WITH PROPOFOL N/A 09/06/2015   Procedure: COLONOSCOPY WITH PROPOFOL;  Surgeon: Midge Minium, MD;  Location: [facility];  Service: Endoscopy;  Laterality: N/A;   POLYPECTOMY  09/06/2015   Procedure: POLYPECTOMY;  Surgeon: Midge Minium, MD;  Location: [facility];  Service: Endoscopy;;   PROSTATE SURGERY     robotic prostatectomy  2006    Home Medications:  Allergies as of 08/16/2023   No Known Allergies      Medication List        Accurate as of August 15, 2023  9:51 PM. If you have any questions, ask your nurse or doctor.           glimepiride 2 MG tablet Commonly known as: AMARYL One a day   levothyroxine 125 MCG tablet Commonly known as: Euthyrox Take 1 tablet (125 mcg total) by mouth daily.   losartan-hydrochlorothiazide 100-25 MG tablet Commonly known as: HYZAAR Take 1 tablet by mouth daily.   metFORMIN 500 MG tablet Commonly known as: GLUCOPHAGE Take 1 tablet (500 mg total) by mouth 2 (two) times daily with a meal.   mirabegron ER 25 MG Tb24 tablet Commonly known as: Myrbetriq Take 1 tablet (25 mg total) by mouth daily.   NONFORMULARY OR COMPOUNDED ITEM Trimix (30/1/10)-(Pap/Phent/PGE)  Dosage: Inject 0.5 cc per injection   Vial 1ml  Qty #10 Refills 6  [facility] [phone] Fax [phone]   potassium chloride 10 MEQ tablet Commonly known as: KLOR-CON M Take 1 tablet (10 mEq total) by mouth daily.   pravastatin 10 MG tablet Commonly known as: PRAVACHOL Take 1 tablet (10 mg total) by mouth daily.        Allergies: No Known Allergies  Family History: Family History  Problem Relation Age of Onset   Heart attack Father    Diabetes Mother    Kidney disease Brother     Social History:  reports that he has quit smoking. His smoking use included cigars and cigarettes. He has a 16 pack-year smoking history. His smokeless tobacco use includes snuff. He reports that he  does not currently use alcohol. He reports that he does not use drugs.  ROS: For pertinent review of systems please refer to history of present illness  Physical Exam: There were no vitals taken for this visit.  Constitutional:  Well nourished. Alert and oriented, No acute distress. HEENT: [place] AT, moist mucus membranes.  Trachea midline Cardiovascular: No clubbing, cyanosis, or edema. Respiratory: Normal respiratory effort, no increased work of breathing. Neurologic: Grossly intact, no focal deficits, moving all 4 extremities. Psychiatric: Normal mood and affect.   Laboratory Data: Lab Results   Component Value Date   CREATININE 1.09 01/14/2023   Lab Results  Component Value Date   HGBA1C 6.9 (H) 01/14/2023  I have reviewed the labs.  Pertinent Imaging ***  Assessment & Plan:    1. Urge incontinence -minimal bother -switch to Myrbetriq 25 mg daily for better compliance -BLADDER SCAN AMB NON-IMAGING  2. Erectile dysfunction -in a new relationship -continue Trimix 30/1/10, 0.5 cc   3. Prostate cancer -PSA pending    No follow-ups on file.  [name]  [facility] [address] [place], [place] [address] [phone]

## 2023-08-16 ENCOUNTER — Ambulatory Visit (INDEPENDENT_AMBULATORY_CARE_PROVIDER_SITE_OTHER): Payer: Medicare PPO | Admitting: Urology

## 2023-08-16 ENCOUNTER — Other Ambulatory Visit
Admission: RE | Admit: 2023-08-16 | Discharge: 2023-08-16 | Disposition: A | Discharge status: Home / Self Care | Payer: Medicare PPO | Attending: Urology | Admitting: Urology

## 2023-08-16 ENCOUNTER — Encounter: Payer: Self-pay | Admitting: Urology

## 2023-08-16 VITALS — BP 113/74 | HR 76 | Ht 67.0 in | Wt 197.0 lb

## 2023-08-16 DIAGNOSIS — N3941 Urge incontinence: Secondary | ICD-10-CM | POA: Insufficient documentation

## 2023-08-16 DIAGNOSIS — C61 Malignant neoplasm of prostate: Secondary | ICD-10-CM | POA: Diagnosis not present

## 2023-08-16 DIAGNOSIS — N5231 Erectile dysfunction following radical prostatectomy: Secondary | ICD-10-CM

## 2023-08-16 LAB — URINALYSIS, COMPLETE (UACMP) WITH MICROSCOPIC
Bilirubin Urine: NEGATIVE
Glucose, UA: >=500 mg/dL — AB
Hgb urine dipstick: NEGATIVE
Ketones, ur: 15 mg/dL — AB
Leukocytes,Ua: NEGATIVE
Nitrite: NEGATIVE
Protein, ur: NEGATIVE mg/dL
Specific Gravity, Urine: 1.02 (ref 1.005–1.030)
pH: 5.5 (ref 5.0–8.0)

## 2023-08-16 LAB — PSA: Prostatic Specific Antigen: <0.01 ng/mL (ref 0.00–4.00)

## 2023-08-16 MED ORDER — MIRABEGRON ER 50 MG PO TB24: 50.0000 mg | ORAL_TABLET | Freq: Every day | ORAL | 3 refills | Status: DC

## 2023-08-16 MED ORDER — NONFORMULARY OR COMPOUNDED ITEM: 6 refills | Status: DC

## 2023-08-18 LAB — URINE CULTURE: Culture: NO GROWTH

## 2023-08-19 ENCOUNTER — Other Ambulatory Visit: Payer: Self-pay | Admitting: Family Medicine

## 2023-08-19 DIAGNOSIS — E039 Hypothyroidism, unspecified: Secondary | ICD-10-CM

## 2023-08-20 NOTE — Telephone Encounter (Signed)
Requested Prescriptions  Pending Prescriptions Disp Refills   levothyroxine (SYNTHROID) 125 MCG tablet [Pharmacy Med Name: Levothyroxine Sodium 125 MCG Oral Tablet] 90 tablet 0    Sig: Take 1 tablet by mouth once daily     Endocrinology:  Hypothyroid Agents Passed - 08/19/2023  3:23 PM      Passed - TSH in normal range and within 360 days    TSH  Date Value Ref Range Status  01/14/2023 1.280 0.450 - 4.500 uIU/mL Final         Passed - Valid encounter within last 12 months    Recent Outpatient Visits           3 months ago Type 2 diabetes mellitus without complication, without long-term current use of insulin (HCC)   [facility] [facility], MD   7 months ago Type 2 diabetes mellitus without complication, without long-term current use of insulin (HCC)   [place] [facility], MD   8 months ago Type 2 diabetes mellitus without complication, without long-term current use of insulin (HCC)   [place] [facility], MD   1 year ago Type 2 diabetes mellitus without complication, without long-term current use of insulin (HCC)   [place] [facility], MD   1 year ago Type 2 diabetes mellitus without complication, without long-term current use of insulin (HCC)   [place] [facility], MD       Future Appointments             In 1 week Duanne Limerick, MD [facility], [facility]   In 1 month [place], Elana Alm [facility] [place]

## 2023-09-02 ENCOUNTER — Ambulatory Visit: Payer: Medicare PPO | Admitting: Family Medicine

## 2023-09-09 ENCOUNTER — Ambulatory Visit: Payer: Medicare PPO | Admitting: Family Medicine

## 2023-09-14 ENCOUNTER — Ambulatory Visit: Payer: Medicare PPO | Admitting: Family Medicine

## 2023-09-22 NOTE — Progress Notes (Deleted)
09/27/2023 1:09 PM   Daniel Kirby 06-23-1946 [id number]  Referring provider: Duanne Limerick, MD [address] [place],  [place] [address]  Urological history: 1. Prostate cancer -PSA (07/2023) <0.01  -s/p RPP > 10 years ago  2. UI -contributing factors of age, prostate cancer, pelvic surgery, sugary drinks, diabetes, depression and anxiety -Myrbetriq 25 mg daily   3. ED -contributing factors af age, prostate cancer, pelvic surgery, diabetes, HLD, HTN, hypothyroidism and smoking history -managed with ICI  No chief complaint on file.  HPI: Daniel Kirby is a 77 y.o. male who presents today for follow up.    Previous records reviewed.   At his appoint with on August 16, 2023, he has been having 1-7 daytime voids, nocturia x 1-2 with a strong urge to urinate.  He does not have urinary incontinence.  He does limit fluid intake that he does engage in toilet mapping.  He has noticed a worsening of his frequency of urination over the last 4 months.  Patient denies any modifying or aggravating factors.  Patient denies any recent UTI's, gross hematuria, dysuria or suprapubic/flank pain.  Patient denies any fevers, chills, nausea or vomiting.  UA unremarkable.  Urine culture was negative.  His Myrbetriq was increased from 25 mg a day to 50 mg a day  PVR ***  PMH: Past Medical History:  Diagnosis Date   Abdominal pain    RUQ   Arthritis    right ankle   ED (erectile dysfunction)    HLD (hyperlipidemia)    Hypertension    Hypothyroid    Prostate cancer (HCC)    Vertigo    1 episode only, approx 1 month ago    Surgical History: Past Surgical History:  Procedure Laterality Date   CATARACT EXTRACTION W/PHACO Left 06/04/2021   Procedure: CATARACT EXTRACTION PHACO AND INTRAOCULAR LENS PLACEMENT (IOC) LEFT DIABETIC 5.51 01:10.7;  Surgeon: Lockie Mola, MD;  Location: [facility];  Service: Ophthalmology;  Laterality: Left;   COLONOSCOPY  2006    normal   COLONOSCOPY WITH PROPOFOL N/A 09/06/2015   Procedure: COLONOSCOPY WITH PROPOFOL;  Surgeon: Midge Minium, MD;  Location: [facility];  Service: Endoscopy;  Laterality: N/A;   POLYPECTOMY  09/06/2015   Procedure: POLYPECTOMY;  Surgeon: Midge Minium, MD;  Location: [facility];  Service: Endoscopy;;   PROSTATE SURGERY     robotic prostatectomy  2006    Home Medications:  Allergies as of 09/27/2023   No Known Allergies      Medication List        Accurate as of September 22, 2023  1:09 PM. If you have any questions, ask your nurse or doctor.          glimepiride 2 MG tablet Commonly known as: AMARYL One a day   levothyroxine 125 MCG tablet Commonly known as: SYNTHROID Take 1 tablet by mouth once daily   losartan-hydrochlorothiazide 100-25 MG tablet Commonly known as: HYZAAR Take 1 tablet by mouth daily.   metFORMIN 500 MG tablet Commonly known as: GLUCOPHAGE Take 1 tablet (500 mg total) by mouth 2 (two) times daily with a meal.   mirabegron ER 50 MG Tb24 tablet Commonly known as: MYRBETRIQ Take 1 tablet (50 mg total) by mouth daily.   NONFORMULARY OR COMPOUNDED ITEM Trimix (30/1/10)-(Pap/Phent/PGE)  Dosage: Inject 0.5 cc per injection   Vial 1ml  Qty #10 Refills 6  [facility] [phone] Fax [phone]   potassium chloride 10 MEQ tablet  Commonly known as: KLOR-CON M Take 1 tablet (10 mEq total) by mouth daily.   pravastatin 10 MG tablet Commonly known as: PRAVACHOL Take 1 tablet (10 mg total) by mouth daily.        Allergies: No Known Allergies  Family History: Family History  Problem Relation Age of Onset   Heart attack Father    Diabetes Mother    Kidney disease Brother     Social History:  reports that he has quit smoking. His smoking use included cigars and cigarettes. He has a 16 pack-year smoking history. His smokeless tobacco use includes snuff. He reports that he does not currently use alcohol. He  reports that he does not use drugs.  ROS: For pertinent review of systems please refer to history of present illness  Physical Exam: There were no vitals taken for this visit.  Constitutional:  Well nourished. Alert and oriented, No acute distress. HEENT: [place] AT, moist mucus membranes.  Trachea midline, no masses. Cardiovascular: No clubbing, cyanosis, or edema. Respiratory: Normal respiratory effort, no increased work of breathing. GI: Abdomen is soft, non tender, non distended, no abdominal masses. Liver and spleen not palpable.  No hernias appreciated.  Stool sample for occult testing is not indicated.   GU: No CVA tenderness.  No bladder fullness or masses.  Patient with circumcised/uncircumcised phallus. ***Foreskin easily retracted***  Urethral meatus is patent.  No penile discharge. No penile lesions or rashes. Scrotum without lesions, cysts, rashes and/or edema.  Testicles are located scrotally bilaterally. No masses are appreciated in the testicles. Left and right epididymis are normal. Rectal: Patient with  normal sphincter tone. Anus and perineum without scarring or rashes. No rectal masses are appreciated. Prostate is approximately *** grams, *** nodules are appreciated. Seminal vesicles are normal. Skin: No rashes, bruises or suspicious lesions. Lymph: No cervical or inguinal adenopathy. Neurologic: Grossly intact, no focal deficits, moving all 4 extremities. Psychiatric: Normal mood and affect.   Laboratory Data: N/A  Pertinent Imaging ***  Assessment & Plan:    1. Urge incontinence -UA benign -Urine sent for culture to rule out any indolent infection causing increase in his incontinence -bothersome  -increase to Myrbetriq 50 mg daily  2. Erectile dysfunction -in a new relationship -continue Trimix 30/1/10, 0.5 cc   3. Prostate cancer -PSA undetectable   No follow-ups on file.  [name]  [facility] [address]  [address] [place], [place] [address] [phone]

## 2023-09-25 ENCOUNTER — Inpatient Hospital Stay: Payer: Medicare PPO

## 2023-09-25 ENCOUNTER — Other Ambulatory Visit: Payer: Self-pay

## 2023-09-25 ENCOUNTER — Emergency Department: Payer: Medicare PPO

## 2023-09-25 ENCOUNTER — Inpatient Hospital Stay
Admission: EM | Admit: 2023-09-25 | Discharge: 2023-10-01 | DRG: 176 | Disposition: A | Discharge status: Skilled Nursing Facility | Payer: Medicare PPO | Attending: Internal Medicine | Admitting: Internal Medicine

## 2023-09-25 DIAGNOSIS — Z8546 Personal history of malignant neoplasm of prostate: Secondary | ICD-10-CM | POA: Diagnosis not present

## 2023-09-25 DIAGNOSIS — R32 Unspecified urinary incontinence: Secondary | ICD-10-CM | POA: Diagnosis present

## 2023-09-25 DIAGNOSIS — G319 Degenerative disease of nervous system, unspecified: Secondary | ICD-10-CM | POA: Diagnosis not present

## 2023-09-25 DIAGNOSIS — Z8679 Personal history of other diseases of the circulatory system: Secondary | ICD-10-CM

## 2023-09-25 DIAGNOSIS — I6381 Other cerebral infarction due to occlusion or stenosis of small artery: Secondary | ICD-10-CM | POA: Diagnosis not present

## 2023-09-25 DIAGNOSIS — I2699 Other pulmonary embolism without acute cor pulmonale: Secondary | ICD-10-CM | POA: Diagnosis not present

## 2023-09-25 DIAGNOSIS — Z79899 Other long term (current) drug therapy: Secondary | ICD-10-CM | POA: Diagnosis not present

## 2023-09-25 DIAGNOSIS — W1830XA Fall on same level, unspecified, initial encounter: Secondary | ICD-10-CM | POA: Diagnosis present

## 2023-09-25 DIAGNOSIS — R001 Bradycardia, unspecified: Secondary | ICD-10-CM | POA: Diagnosis not present

## 2023-09-25 DIAGNOSIS — I82432 Acute embolism and thrombosis of left popliteal vein: Secondary | ICD-10-CM | POA: Diagnosis not present

## 2023-09-25 DIAGNOSIS — I4729 Other ventricular tachycardia: Secondary | ICD-10-CM | POA: Diagnosis not present

## 2023-09-25 DIAGNOSIS — E785 Hyperlipidemia, unspecified: Secondary | ICD-10-CM | POA: Diagnosis present

## 2023-09-25 DIAGNOSIS — F17210 Nicotine dependence, cigarettes, uncomplicated: Secondary | ICD-10-CM | POA: Diagnosis present

## 2023-09-25 DIAGNOSIS — E860 Dehydration: Secondary | ICD-10-CM | POA: Diagnosis present

## 2023-09-25 DIAGNOSIS — Z6834 Body mass index (BMI) 34.0-34.9, adult: Secondary | ICD-10-CM

## 2023-09-25 DIAGNOSIS — Y92009 Unspecified place in unspecified non-institutional (private) residence as the place of occurrence of the external cause: Secondary | ICD-10-CM

## 2023-09-25 DIAGNOSIS — E119 Type 2 diabetes mellitus without complications: Secondary | ICD-10-CM | POA: Diagnosis present

## 2023-09-25 DIAGNOSIS — E669 Obesity, unspecified: Secondary | ICD-10-CM | POA: Insufficient documentation

## 2023-09-25 DIAGNOSIS — Z79890 Hormone replacement therapy (postmenopausal): Secondary | ICD-10-CM

## 2023-09-25 DIAGNOSIS — Z91148 Patient's other noncompliance with medication regimen for other reason: Secondary | ICD-10-CM

## 2023-09-25 DIAGNOSIS — Z7984 Long term (current) use of oral hypoglycemic drugs: Secondary | ICD-10-CM

## 2023-09-25 DIAGNOSIS — I2609 Other pulmonary embolism with acute cor pulmonale: Secondary | ICD-10-CM | POA: Diagnosis not present

## 2023-09-25 DIAGNOSIS — I472 Ventricular tachycardia, unspecified: Secondary | ICD-10-CM

## 2023-09-25 DIAGNOSIS — E1169 Type 2 diabetes mellitus with other specified complication: Secondary | ICD-10-CM

## 2023-09-25 DIAGNOSIS — I672 Cerebral atherosclerosis: Secondary | ICD-10-CM | POA: Diagnosis not present

## 2023-09-25 DIAGNOSIS — I4581 Long QT syndrome: Secondary | ICD-10-CM | POA: Diagnosis not present

## 2023-09-25 DIAGNOSIS — Z86718 Personal history of other venous thrombosis and embolism: Secondary | ICD-10-CM

## 2023-09-25 DIAGNOSIS — I493 Ventricular premature depolarization: Secondary | ICD-10-CM | POA: Diagnosis present

## 2023-09-25 DIAGNOSIS — I82409 Acute embolism and thrombosis of unspecified deep veins of unspecified lower extremity: Secondary | ICD-10-CM | POA: Insufficient documentation

## 2023-09-25 DIAGNOSIS — E876 Hypokalemia: Secondary | ICD-10-CM | POA: Diagnosis not present

## 2023-09-25 DIAGNOSIS — Z87898 Personal history of other specified conditions: Secondary | ICD-10-CM | POA: Diagnosis present

## 2023-09-25 DIAGNOSIS — R55 Syncope and collapse: Secondary | ICD-10-CM | POA: Diagnosis not present

## 2023-09-25 DIAGNOSIS — E278 Other specified disorders of adrenal gland: Secondary | ICD-10-CM | POA: Diagnosis not present

## 2023-09-25 DIAGNOSIS — Z7401 Bed confinement status: Secondary | ICD-10-CM | POA: Diagnosis not present

## 2023-09-25 DIAGNOSIS — E66812 Obesity, class 2: Secondary | ICD-10-CM | POA: Insufficient documentation

## 2023-09-25 DIAGNOSIS — R7989 Other specified abnormal findings of blood chemistry: Secondary | ICD-10-CM | POA: Diagnosis not present

## 2023-09-25 DIAGNOSIS — R29898 Other symptoms and signs involving the musculoskeletal system: Secondary | ICD-10-CM | POA: Diagnosis not present

## 2023-09-25 DIAGNOSIS — R5381 Other malaise: Secondary | ICD-10-CM | POA: Diagnosis present

## 2023-09-25 DIAGNOSIS — R2681 Unsteadiness on feet: Secondary | ICD-10-CM | POA: Diagnosis not present

## 2023-09-25 DIAGNOSIS — T796XXA Traumatic ischemia of muscle, initial encounter: Secondary | ICD-10-CM | POA: Diagnosis not present

## 2023-09-25 DIAGNOSIS — M6259 Muscle wasting and atrophy, not elsewhere classified, multiple sites: Secondary | ICD-10-CM | POA: Diagnosis not present

## 2023-09-25 DIAGNOSIS — I63521 Cerebral infarction due to unspecified occlusion or stenosis of right anterior cerebral artery: Secondary | ICD-10-CM | POA: Diagnosis not present

## 2023-09-25 DIAGNOSIS — Z8673 Personal history of transient ischemic attack (TIA), and cerebral infarction without residual deficits: Secondary | ICD-10-CM

## 2023-09-25 DIAGNOSIS — E878 Other disorders of electrolyte and fluid balance, not elsewhere classified: Secondary | ICD-10-CM | POA: Diagnosis present

## 2023-09-25 DIAGNOSIS — R42 Dizziness and giddiness: Secondary | ICD-10-CM | POA: Diagnosis not present

## 2023-09-25 DIAGNOSIS — Z7901 Long term (current) use of anticoagulants: Secondary | ICD-10-CM

## 2023-09-25 DIAGNOSIS — R531 Weakness: Secondary | ICD-10-CM

## 2023-09-25 DIAGNOSIS — Z841 Family history of disorders of kidney and ureter: Secondary | ICD-10-CM

## 2023-09-25 DIAGNOSIS — E039 Hypothyroidism, unspecified: Secondary | ICD-10-CM | POA: Diagnosis not present

## 2023-09-25 DIAGNOSIS — E872 Acidosis, unspecified: Secondary | ICD-10-CM | POA: Diagnosis not present

## 2023-09-25 DIAGNOSIS — Z833 Family history of diabetes mellitus: Secondary | ICD-10-CM | POA: Diagnosis not present

## 2023-09-25 DIAGNOSIS — Z8249 Family history of ischemic heart disease and other diseases of the circulatory system: Secondary | ICD-10-CM

## 2023-09-25 DIAGNOSIS — I10 Essential (primary) hypertension: Secondary | ICD-10-CM | POA: Diagnosis not present

## 2023-09-25 DIAGNOSIS — Z741 Need for assistance with personal care: Secondary | ICD-10-CM | POA: Diagnosis not present

## 2023-09-25 LAB — CBC
HCT: 37 % — ABNORMAL LOW (ref 39.0–52.0)
Hemoglobin: 13 g/dL (ref 13.0–17.0)
MCH: 30.7 pg (ref 26.0–34.0)
MCHC: 35.1 g/dL (ref 30.0–36.0)
MCV: 87.3 fL (ref 80.0–100.0)
Platelets: 330 10*3/uL (ref 150–400)
RBC: 4.24 MIL/uL (ref 4.22–5.81)
RDW: 14.1 % (ref 11.5–15.5)
WBC: 9.8 10*3/uL (ref 4.0–10.5)
nRBC: 0 % (ref 0.0–0.2)

## 2023-09-25 LAB — HEPATIC FUNCTION PANEL
ALT: 28 U/L (ref 0–44)
AST: 36 U/L (ref 15–41)
Albumin: 3.5 g/dL (ref 3.5–5.0)
Alkaline Phosphatase: 51 U/L (ref 38–126)
Bilirubin, Direct: 0.1 mg/dL (ref 0.0–0.2)
Indirect Bilirubin: 0.6 mg/dL (ref 0.3–0.9)
Total Bilirubin: 0.7 mg/dL (ref <1.2)
Total Protein: 6.8 g/dL (ref 6.5–8.1)

## 2023-09-25 LAB — BASIC METABOLIC PANEL
Anion gap: 14 (ref 5–15)
BUN: 12 mg/dL (ref 8–23)
CO2: 27 mmol/L (ref 22–32)
Calcium: 8.5 mg/dL — ABNORMAL LOW (ref 8.9–10.3)
Chloride: 96 mmol/L — ABNORMAL LOW (ref 98–111)
Creatinine, Ser: 1.22 mg/dL (ref 0.61–1.24)
GFR, Estimated: >60 mL/min (ref >60)
Glucose, Bld: 277 mg/dL — ABNORMAL HIGH (ref 70–99)
Potassium: 2 mmol/L — CL (ref 3.5–5.1)
Sodium: 137 mmol/L (ref 135–145)

## 2023-09-25 LAB — TROPONIN I (HIGH SENSITIVITY): Troponin I (High Sensitivity): 20 ng/L — ABNORMAL HIGH (ref <18)

## 2023-09-25 LAB — LACTIC ACID, PLASMA: Lactic Acid, Venous: 3.4 mmol/L (ref 0.5–1.9)

## 2023-09-25 LAB — T4, FREE: Free T4: <0.25 ng/dL — ABNORMAL LOW (ref 0.61–1.12)

## 2023-09-25 LAB — CK: Total CK: 1431 U/L — ABNORMAL HIGH (ref 49–397)

## 2023-09-25 LAB — MAGNESIUM: Magnesium: 1.7 mg/dL (ref 1.7–2.4)

## 2023-09-25 LAB — D-DIMER, QUANTITATIVE: D-Dimer, Quant: 9.18 ug{FEU}/mL — ABNORMAL HIGH (ref 0.00–0.50)

## 2023-09-25 LAB — TSH: TSH: 35.582 u[IU]/mL — ABNORMAL HIGH (ref 0.350–4.500)

## 2023-09-25 MED ORDER — POTASSIUM CHLORIDE 10 MEQ/100ML IV SOLN
10.0000 meq | Freq: Once | INTRAVENOUS | Status: AC
  Administered 2023-09-25: 10 meq via INTRAVENOUS
  Filled 2023-09-25: qty 100

## 2023-09-25 MED ORDER — PANTOPRAZOLE SODIUM 40 MG IV SOLR
40.0000 mg | Freq: Two times a day (BID) | INTRAVENOUS | Status: DC
  Administered 2023-09-25: 40 mg via INTRAVENOUS
  Filled 2023-09-25: qty 10

## 2023-09-25 MED ORDER — LACTATED RINGERS IV BOLUS
1000.0000 mL | Freq: Once | INTRAVENOUS | Status: AC
  Administered 2023-09-25: 1000 mL via INTRAVENOUS

## 2023-09-25 MED ORDER — IOHEXOL 350 MG/ML SOLN
100.0000 mL | Freq: Once | INTRAVENOUS | Status: AC | PRN
  Administered 2023-09-25: 100 mL via INTRAVENOUS

## 2023-09-25 MED ORDER — POTASSIUM CHLORIDE CRYS ER 20 MEQ PO TBCR
40.0000 meq | EXTENDED_RELEASE_TABLET | Freq: Once | ORAL | Status: AC
  Administered 2023-09-25: 40 meq via ORAL
  Filled 2023-09-25: qty 2

## 2023-09-25 MED ORDER — LACTATED RINGERS IV SOLN: INTRAVENOUS | Status: DC

## 2023-09-25 MED ORDER — DIAZEPAM 5 MG/ML IJ SOLN
2.5000 mg | Freq: Once | INTRAMUSCULAR | Status: AC
  Administered 2023-09-25: 2.5 mg via INTRAVENOUS
  Filled 2023-09-25: qty 2

## 2023-09-25 MED ORDER — SODIUM CHLORIDE 0.9% FLUSH
3.0000 mL | Freq: Two times a day (BID) | INTRAVENOUS | Status: DC
  Administered 2023-09-25 – 2023-09-30 (×9): 3 mL via INTRAVENOUS

## 2023-09-25 NOTE — H&P (Incomplete)
History and Physical    Patient: Daniel Kirby WGN:[id number] DOB: [birth year] DOA: 09/25/2023 DOS: the patient was seen and examined on 09/25/2023 PCP: Duanne Limerick, MD  Patient coming from: Home  Chief Complaint:  Chief Complaint  Patient presents with   Dizziness    HPI: Daniel Kirby is a 77 y.o. male with medical history significant for ***   Triage note reviewed which states that patient comes in for dizziness that is been occurring for about 2 weeks worse with standing up and patient notes that he passes out especially with standing up, patient had urinated on himself as he could not make it to the bathroom because he was dizzy and he passed out and syncopal episode reported was 2 days ago there is report that he did strike his head and patient is not on blood thinners.  Initial vitals showed heart rates in the 40s blood pressure 140/82 afebrile 98 5% O2 sats on follow-up care and blood sugar of 357     In emergency room vitals trend shows bradycardia as noted otherwise normal. Vitals:   09/25/23 1736 09/25/23 1835  BP: (!) 123/97 136/60  Pulse: (!) 44 89  Temp: 98.3 F (36.8 C)   Resp: 18 19  SpO2: 99% 94%  Stat initial EKG in the emergency room again shows sinus rhythm normal narrow QRS axis PVCs no ST-T wave changes normal QTc. Labs are notable for : Initial metabolic panel shows hypokalemia with a potassium of 2.0 normal sodium chloride 96 normal bicarb glucose 277 normal kidney function normal anion gap calcium 8.5 magnesium 1.7 LFTs ordered and pending. Normal CBC with a white count of 9.8 hemoglobin 13 platelets of 330. Last A1c was 6.9 in February 2024.Marland Kitchen Head CT noncontrast today showed no acute intracranial abnormality and chronic maxillary sinus disease in the left sinus.  In the ED pt received: Medications  lactated ringers bolus 1,000 mL (1,000 mLs Intravenous New Bag/Given 09/25/23 2045)  potassium chloride 10 mEq in 100 mL IVPB (0 mEq Intravenous Stopped  09/25/23 2054)  potassium chloride SA (KLOR-CON M) CR tablet 40 mEq (40 mEq Oral Given 09/25/23 1928)     ROS Past Medical History:  Diagnosis Date   Abdominal pain    RUQ   Arthritis    right ankle   ED (erectile dysfunction)    HLD (hyperlipidemia)    Hypertension    Hypothyroid    Prostate cancer (HCC)    Vertigo    1 episode only, approx 1 month ago   Past Surgical History:  Procedure Laterality Date   CATARACT EXTRACTION W/PHACO Left 06/04/2021   Procedure: CATARACT EXTRACTION PHACO AND INTRAOCULAR LENS PLACEMENT (IOC) LEFT DIABETIC 5.51 01:10.7;  Surgeon: Lockie Mola, MD;  Location: [facility];  Service: Ophthalmology;  Laterality: Left;   COLONOSCOPY  2006   normal   COLONOSCOPY WITH PROPOFOL N/A 09/06/2015   Procedure: COLONOSCOPY WITH PROPOFOL;  Surgeon: Midge Minium, MD;  Location: [facility];  Service: Endoscopy;  Laterality: N/A;   POLYPECTOMY  09/06/2015   Procedure: POLYPECTOMY;  Surgeon: Midge Minium, MD;  Location: [facility];  Service: Endoscopy;;   PROSTATE SURGERY     robotic prostatectomy  2006    reports that he has quit smoking. His smoking use included cigars and cigarettes. He has a 16 pack-year smoking history. His smokeless tobacco use includes snuff. He reports that he does not currently use alcohol. He reports that he does not use drugs.  No  Known Allergies  Family History  Problem Relation Age of Onset   Heart attack Father    Diabetes Mother    Kidney disease Brother     Prior to Admission medications   Medication Sig Start Date End Date Taking? Authorizing Provider  glimepiride (AMARYL) 2 MG tablet One a day 05/14/23   Duanne Limerick, MD  levothyroxine (SYNTHROID) 125 MCG tablet Take 1 tablet by mouth once daily 08/20/23   Duanne Limerick, MD  losartan-hydrochlorothiazide (HYZAAR) 100-25 MG tablet Take 1 tablet by mouth daily. 12/10/22   Duanne Limerick, MD  metFORMIN (GLUCOPHAGE) 500 MG tablet Take 1 tablet  (500 mg total) by mouth 2 (two) times daily with a meal. 05/14/23   Duanne Limerick, MD  mirabegron ER (MYRBETRIQ) 50 MG TB24 tablet Take 1 tablet (50 mg total) by mouth daily. 08/16/23   Michiel Cowboy A, PA-C  NONFORMULARY OR COMPOUNDED ITEM Trimix (30/1/10)-(Pap/Phent/PGE)  Dosage: Inject 0.5 cc per injection   Vial 1ml  Qty #10 Refills 6  [facility] [phone] Fax [phone] 08/16/23   Michiel Cowboy A, PA-C  potassium chloride (KLOR-CON M) 10 MEQ tablet Take 1 tablet (10 mEq total) by mouth daily. 01/19/23   Duanne Limerick, MD  pravastatin (PRAVACHOL) 10 MG tablet Take 1 tablet (10 mg total) by mouth daily. 12/10/22   Duanne Limerick, MD     Vitals:   09/25/23 1736 09/25/23 1835  BP: (!) 123/97 136/60  Pulse: (!) 44 89  Resp: 18 19  Temp: 98.3 F (36.8 C)   SpO2: 99% 94%   Physical Exam   Labs on Admission: I have personally reviewed following labs and imaging studies Results for orders placed or performed during the hospital encounter of 09/25/23 (from the past 24 hour(s))  Basic metabolic panel     Status: Abnormal   Collection Time: 09/25/23  5:38 PM  Result Value Ref Range   Sodium 137 135 - 145 mmol/L   Potassium 2.0 (LL) 3.5 - 5.1 mmol/L   Chloride 96 (L) 98 - 111 mmol/L   CO2 27 22 - 32 mmol/L   Glucose, Bld 277 (H) 70 - 99 mg/dL   BUN 12 8 - 23 mg/dL   Creatinine, Ser 1.30 0.61 - 1.24 mg/dL   Calcium 8.5 (L) 8.9 - 10.3 mg/dL   GFR, Estimated >86 >57 mL/min   Anion gap 14 5 - 15  CBC     Status: Abnormal   Collection Time: 09/25/23  5:38 PM  Result Value Ref Range   WBC 9.8 4.0 - 10.5 K/uL   RBC 4.24 4.22 - 5.81 MIL/uL   Hemoglobin 13.0 13.0 - 17.0 g/dL   HCT 84.6 (L) 96.2 - 95.2 %   MCV 87.3 80.0 - 100.0 fL   MCH 30.7 26.0 - 34.0 pg   MCHC 35.1 30.0 - 36.0 g/dL   RDW 84.1 32.4 - 40.1 %   Platelets 330 150 - 400 K/uL   nRBC 0.0 0.0 - 0.2 %  Magnesium     Status: None   Collection Time: 09/25/23  5:38 PM  Result Value Ref Range    Magnesium 1.7 1.7 - 2.4 mg/dL    CBC: Recent Labs  Lab 09/25/23 1738  WBC 9.8  HGB 13.0  HCT 37.0*  MCV 87.3  PLT 330   Basic Metabolic Panel: Recent Labs  Lab 09/25/23 1738  NA 137  K 2.0*  CL 96*  CO2 27  GLUCOSE 277*  BUN 12  CREATININE 1.22  CALCIUM 8.5*  MG 1.7   GFR: CrCl cannot be calculated (Unknown ideal weight.). Liver Function Tests: No results for input(s): "AST", "ALT", "ALKPHOS", "BILITOT", "PROT", "ALBUMIN" in the last 168 hours. No results for input(s): "LIPASE", "AMYLASE" in the last 168 hours. No results for input(s): "AMMONIA" in the last 168 hours. Coagulation Profile: No results for input(s): "INR", "PROTIME" in the last 168 hours. Cardiac Enzymes: No results for input(s): "CKTOTAL", "CKMB", "CKMBINDEX", "TROPONINI" in the last 168 hours. BNP (last 3 results) No results for input(s): "PROBNP" in the last 8760 hours. HbA1C: No results for input(s): "HGBA1C" in the last 72 hours. CBG: No results for input(s): "GLUCAP" in the last 168 hours. Lipid Profile: No results for input(s): "CHOL", "HDL", "LDLCALC", "TRIG", "CHOLHDL", "LDLDIRECT" in the last 72 hours. Thyroid Function Tests: No results for input(s): "TSH", "T4TOTAL", "FREET4", "T3FREE", "THYROIDAB" in the last 72 hours. Anemia Panel: No results for input(s): "VITAMINB12", "FOLATE", "FERRITIN", "TIBC", "IRON", "RETICCTPCT" in the last 72 hours. Urinalysis    Component Value Date/Time   COLORURINE YELLOW 08/16/2023 1508   APPEARANCEUR HAZY (A) 08/16/2023 1508   APPEARANCEUR Clear 01/06/2018 1503   LABSPEC 1.020 08/16/2023 1508   PHURINE 5.5 08/16/2023 1508   GLUCOSEU >=500 (A) 08/16/2023 1508   HGBUR NEGATIVE 08/16/2023 1508   BILIRUBINUR NEGATIVE 08/16/2023 1508   BILIRUBINUR Negative 01/06/2018 1503   KETONESUR 15 (A) 08/16/2023 1508   PROTEINUR NEGATIVE 08/16/2023 1508   UROBILINOGEN 0.2 11/05/2017 1609   NITRITE NEGATIVE 08/16/2023 1508   LEUKOCYTESUR NEGATIVE 08/16/2023 1508       Unresulted Labs (From admission, onward)     Start     Ordered   09/25/23 1738  Urinalysis, Routine w reflex microscopic -Urine, Clean Catch  Once,   URGENT       Question:  Specimen Source  Answer:  Urine, Clean Catch   09/25/23 1738            Medications  lactated ringers bolus 1,000 mL (1,000 mLs Intravenous New Bag/Given 09/25/23 2045)  potassium chloride 10 mEq in 100 mL IVPB (0 mEq Intravenous Stopped 09/25/23 2054)  potassium chloride SA (KLOR-CON M) CR tablet 40 mEq (40 mEq Oral Given 09/25/23 1928)    Radiological Exams on Admission: CT HEAD WO CONTRAST  Result Date: 09/25/2023 CLINICAL DATA:  dizziness EXAM: CT HEAD WITHOUT CONTRAST TECHNIQUE: Contiguous axial images were obtained from the base of the skull through the vertex without intravenous contrast. RADIATION DOSE REDUCTION: This exam was performed according to the departmental dose-optimization program which includes automated exposure control, adjustment of the mA and/or kV according to patient size and/or use of iterative reconstruction technique. COMPARISON:  None Available. FINDINGS: Brain: Patchy and confluent areas of decreased attenuation are noted throughout the deep and periventricular white matter of the cerebral hemispheres bilaterally, compatible with chronic microvascular ischemic disease. Right cerebellar lacunar infarction. No evidence of large-territorial acute infarction. No parenchymal hemorrhage. No mass lesion. No extra-axial collection. No mass effect or midline shift. No hydrocephalus. Basilar cisterns are patent. Vascular: No hyperdense vessel. Atherosclerotic calcifications are present within the cavernous internal carotid and vertebral arteries. Skull: No acute fracture or focal lesion. Sinuses/Orbits: Left maxillary sinus mucosal thickening with wall hypertrophy. Otherwise paranasal sinuses and mastoid air cells are clear. Left orbital lens replacement. Otherwise the orbits are unremarkable.  Other: None. IMPRESSION: 1. No acute intracranial abnormality. 2. Chronic left maxillary sinus disease. Electronically Signed   By: Normajean Glasgow.D.  On: 09/25/2023 19:34     Data Reviewed: Relevant notes from primary care and specialist visits, past discharge summaries as available in EHR, including Care Everywhere. Prior diagnostic testing as pertinent to current admission diagnoses Updated medications and problem lists for reconciliation ED course, including vitals, labs, imaging, treatment and response to treatment Triage notes, nursing and pharmacy notes and ED provider's notes Notable results as noted in HPI  Assessment and Plan: No notes have been filed under this hospital service. Service: Hospitalist       Prognosis: ***  DVT prophylaxis:  ***  Consults:  ***  Advance Care Planning:    Code Status: Not on file   Family Communication:  ***  Disposition Plan:  ***  Severity of Illness: {Observation/Inpatient:21159}  Author: Gertha Calkin, MD 09/25/2023 9:20 PM  For on call review [email or web address].

## 2023-09-25 NOTE — ED Provider Notes (Signed)
[facility] Provider Note    Event Date/Time   First MD Initiated Contact with Patient 09/25/23 1900     (approximate)  History   Chief Complaint: Dizziness  HPI  Daniel Kirby is a 77 y.o. male with a past medical history of hypertension, hyperlipidemia, prostate cancer, presents to the emergency department for 2 weeks of dizziness and weakness.  According to the patient for the past 2 weeks she has been feeling weak and dizzy at times.  Patient states at times he feels like he has no strength.  Family states he has not been eating or drinking very much recently.  Physical Exam   Triage Vital Signs: ED Triage Vitals [09/25/23 1736]  Encounter Vitals Group     BP (!) 123/97     Systolic BP Percentile      Diastolic BP Percentile      Pulse Rate (!) 44     Resp 18     Temp 98.3 F (36.8 C)     Temp src      SpO2 99 %     Weight      Height      Head Circumference      Peak Flow      Pain Score 0     Pain Loc      Pain Education      Exclude from Growth Chart     Most recent vital signs: Vitals:   09/25/23 1736 09/25/23 1835  BP: (!) 123/97 136/60  Pulse: (!) 44 89  Resp: 18 19  Temp: 98.3 F (36.8 C)   SpO2: 99% 94%    General: Awake, no distress.  CV:  Good peripheral perfusion.  Regular rate and rhythm  Resp:  Normal effort.  Equal breath sounds bilaterally.  Abd:  No distention.  Soft, nontender.  No rebound or guarding benign abdomen  ED Results / Procedures / Treatments   EKG  EKG viewed and interpreted by myself shows a sinus rhythm at 90 bpm with a narrow QRS, normal axis, normal intervals frequent PVCs but no ST elevation.  RADIOLOGY  I have reviewed and interpreted CT scans of the head.  I do not see any obvious bleed on my evaluation. Radiology has read the CT scan is negative for acute abnormality.   MEDICATIONS ORDERED IN ED: Medications  potassium chloride 10 mEq in 100 mL IVPB (10 mEq Intravenous New Bag/Given  09/25/23 1922)  lactated ringers bolus 1,000 mL (has no administration in time range)  potassium chloride SA (KLOR-CON M) CR tablet 40 mEq (40 mEq Oral Given 09/25/23 1928)     IMPRESSION / MDM / ASSESSMENT AND PLAN / ED COURSE  I reviewed the triage vital signs and the nursing notes.  Patient's presentation is most consistent with acute presentation with potential threat to life or bodily function.  Patient presents to the emergency department for dizziness and weakness ongoing over the last 2 weeks including several falls.  Patient CT scan of the head is negative.  Patient's CBC is reassuring, magnesium is normal.  Patient's chemistry however shows significant hypokalemia.  Patient is on HCTZ but I do not see any other diuretics on his medication list.  Patient denies any recent nausea vomiting or diarrhea.  No history of hypokalemia in the past.  Given the patient's significant hypokalemia several weeks of dizziness weakness falls/near falls.  Will admit to the hospital service for further workup and treatment.  FINAL CLINICAL IMPRESSION(S) /  ED DIAGNOSES   Weakness Hypokalemia   Note:  This document was prepared using Dragon voice recognition software and may include unintentional dictation errors.   Minna Antis, MD 09/25/23 2159

## 2023-09-25 NOTE — ED Notes (Signed)
Patient to MRI.

## 2023-09-25 NOTE — ED Triage Notes (Signed)
First nurse note: Pt to ED via [facility] from home. Pt reports dizziness x1-2wks. Pt reports everytime he stands up he passes out. Pt had urinated on himself and stated he couldn't make it to the bathroom. Pt reports last syncopal episode 2 days ago. Pt reports did hit his head. Pt denies blood thinners.   EMS VS  HR 40s BP 140/82 CBG 357 98.9 oral 95% RA

## 2023-09-25 NOTE — H&P (Incomplete)
History and Physical    Patient: Daniel Kirby WJX:[id number] DOB: March 15, 1946 DOA: 09/25/2023 DOS: the patient was seen and examined on 09/25/2023 PCP: Duanne Limerick, MD  Patient coming from: Home  Chief Complaint:  Chief Complaint  Patient presents with  . Dizziness    HPI: Daniel Kirby is a 77 y.o. male with medical history significant for ***   Triage note reviewed which states that patient comes in for dizziness that is been occurring for about 2 weeks worse with standing up and patient notes that he passes out especially with standing up, patient had urinated on himself as he could not make it to the bathroom because he was dizzy and he passed out and syncopal episode reported was 2 days ago there is report that he did strike his head and patient is not on blood thinners.  Initial vitals showed heart rates in the 40s blood pressure 140/82 afebrile 98 5% O2 sats on follow-up care and blood sugar of 357     In emergency room vitals trend shows bradycardia as noted otherwise normal. Vitals:   09/25/23 1736 09/25/23 1835  BP: (!) 123/97 136/60  Pulse: (!) 44 89  Temp: 98.3 F (36.8 C)   Resp: 18 19  SpO2: 99% 94%  Stat initial EKG in the emergency room again shows sinus rhythm normal narrow QRS axis PVCs no ST-T wave changes normal QTc. Labs are notable for : Initial metabolic panel shows hypokalemia with a potassium of 2.0 normal sodium chloride 96 normal bicarb glucose 277 normal kidney function normal anion gap calcium 8.5 magnesium 1.7 LFTs ordered and pending. Normal CBC with a white count of 9.8 hemoglobin 13 platelets of 330. Last A1c was 6.9 in February 2024.Marland Kitchen Head CT noncontrast today showed no acute intracranial abnormality and chronic maxillary sinus disease in the left sinus.  In the ED pt received: Medications  lactated ringers bolus 1,000 mL (1,000 mLs Intravenous New Bag/Given 09/25/23 2045)  potassium chloride 10 mEq in 100 mL IVPB (0 mEq Intravenous Stopped  09/25/23 2054)  potassium chloride SA (KLOR-CON M) CR tablet 40 mEq (40 mEq Oral Given 09/25/23 1928)     Review of Systems  Musculoskeletal:  Positive for falls.  Neurological:  Positive for dizziness, loss of consciousness and weakness.  All other systems reviewed and are negative.  Past Medical History:  Diagnosis Date  . Abdominal pain    RUQ  . Arthritis    right ankle  . ED (erectile dysfunction)   . HLD (hyperlipidemia)   . Hypertension   . Hypothyroid   . Prostate cancer (HCC)   . Vertigo    1 episode only, approx 1 month ago   Past Surgical History:  Procedure Laterality Date  . CATARACT EXTRACTION W/PHACO Left 06/04/2021   Procedure: CATARACT EXTRACTION PHACO AND INTRAOCULAR LENS PLACEMENT (IOC) LEFT DIABETIC 5.51 01:10.7;  Surgeon: Lockie Mola, MD;  Location: [facility];  Service: Ophthalmology;  Laterality: Left;  . COLONOSCOPY  2006   normal  . COLONOSCOPY WITH PROPOFOL N/A 09/06/2015   Procedure: COLONOSCOPY WITH PROPOFOL;  Surgeon: Midge Minium, MD;  Location: [facility];  Service: Endoscopy;  Laterality: N/A;  . POLYPECTOMY  09/06/2015   Procedure: POLYPECTOMY;  Surgeon: Midge Minium, MD;  Location: [facility];  Service: Endoscopy;;  . PROSTATE SURGERY    . robotic prostatectomy  2006    reports that he has quit smoking. His smoking use included cigars and cigarettes. He has a 16  pack-year smoking history. His smokeless tobacco use includes snuff. He reports that he does not currently use alcohol. He reports that he does not use drugs.  No Known Allergies  Family History  Problem Relation Age of Onset  . Heart attack Father   . Diabetes Mother   . Kidney disease Brother     Prior to Admission medications   Medication Sig Start Date End Date Taking? Authorizing Provider  glimepiride (AMARYL) 2 MG tablet One a day 05/14/23   Duanne Limerick, MD  levothyroxine (SYNTHROID) 125 MCG tablet Take 1 tablet by mouth once daily  08/20/23   Duanne Limerick, MD  losartan-hydrochlorothiazide (HYZAAR) 100-25 MG tablet Take 1 tablet by mouth daily. 12/10/22   Duanne Limerick, MD  metFORMIN (GLUCOPHAGE) 500 MG tablet Take 1 tablet (500 mg total) by mouth 2 (two) times daily with a meal. 05/14/23   Duanne Limerick, MD  mirabegron ER (MYRBETRIQ) 50 MG TB24 tablet Take 1 tablet (50 mg total) by mouth daily. 08/16/23   Michiel Cowboy A, PA-C  NONFORMULARY OR COMPOUNDED ITEM Trimix (30/1/10)-(Pap/Phent/PGE)  Dosage: Inject 0.5 cc per injection   Vial 1ml  Qty #10 Refills 6  [facility] [phone] Fax [phone] 08/16/23   Michiel Cowboy A, PA-C  potassium chloride (KLOR-CON M) 10 MEQ tablet Take 1 tablet (10 mEq total) by mouth daily. 01/19/23   Duanne Limerick, MD  pravastatin (PRAVACHOL) 10 MG tablet Take 1 tablet (10 mg total) by mouth daily. 12/10/22   Duanne Limerick, MD     Vitals:   09/25/23 1736 09/25/23 1835  BP: (!) 123/97 136/60  Pulse: (!) 44 89  Resp: 18 19  Temp: 98.3 F (36.8 C)   SpO2: 99% 94%   Physical Exam Vitals reviewed.  Constitutional:      General: He is not in acute distress. HENT:     Head: Normocephalic and atraumatic.     Right Ear: External ear normal.     Left Ear: External ear normal.  Eyes:     General: Lids are normal. Vision grossly intact.        Right eye: No discharge.     Extraocular Movements: Extraocular movements intact.     Right eye: Normal extraocular motion and no nystagmus.     Left eye: Normal extraocular motion and no nystagmus.     Comments: Pupils pinpoint BL.  Cardiovascular:     Rate and Rhythm: Regular rhythm. Bradycardia present.     Heart sounds: Normal heart sounds.  Pulmonary:     Effort: Pulmonary effort is normal.     Breath sounds: Normal breath sounds.  Abdominal:     General: Bowel sounds are normal. There is no distension.     Palpations: Abdomen is soft.     Tenderness: There is no abdominal tenderness. There is no guarding.   Musculoskeletal:     Right lower leg: 2+ Edema present.     Left lower leg: 2+ Edema present.  Skin:    General: Skin is warm and dry.  Neurological:     General: No focal deficit present.     Mental Status: He is alert and oriented to person, place, and time. Mental status is at baseline.  Psychiatric:        Mood and Affect: Mood normal.        Behavior: Behavior normal.      Labs on Admission: I have personally reviewed following labs and imaging studies Results  for orders placed or performed during the hospital encounter of 09/25/23 (from the past 24 hour(s))  Basic metabolic panel     Status: Abnormal   Collection Time: 09/25/23  5:38 PM  Result Value Ref Range   Sodium 137 135 - 145 mmol/L   Potassium 2.0 (LL) 3.5 - 5.1 mmol/L   Chloride 96 (L) 98 - 111 mmol/L   CO2 27 22 - 32 mmol/L   Glucose, Bld 277 (H) 70 - 99 mg/dL   BUN 12 8 - 23 mg/dL   Creatinine, Ser 1.61 0.61 - 1.24 mg/dL   Calcium 8.5 (L) 8.9 - 10.3 mg/dL   GFR, Estimated >09 >60 mL/min   Anion gap 14 5 - 15  CBC     Status: Abnormal   Collection Time: 09/25/23  5:38 PM  Result Value Ref Range   WBC 9.8 4.0 - 10.5 K/uL   RBC 4.24 4.22 - 5.81 MIL/uL   Hemoglobin 13.0 13.0 - 17.0 g/dL   HCT 45.4 (L) 09.8 - 11.9 %   MCV 87.3 80.0 - 100.0 fL   MCH 30.7 26.0 - 34.0 pg   MCHC 35.1 30.0 - 36.0 g/dL   RDW 14.7 82.9 - 56.2 %   Platelets 330 150 - 400 K/uL   nRBC 0.0 0.0 - 0.2 %  Magnesium     Status: None   Collection Time: 09/25/23  5:38 PM  Result Value Ref Range   Magnesium 1.7 1.7 - 2.4 mg/dL    CBC: Recent Labs  Lab 09/25/23 1738  WBC 9.8  HGB 13.0  HCT 37.0*  MCV 87.3  PLT 330   Basic Metabolic Panel: Recent Labs  Lab 09/25/23 1738  NA 137  K 2.0*  CL 96*  CO2 27  GLUCOSE 277*  BUN 12  CREATININE 1.22  CALCIUM 8.5*  MG 1.7   GFR: CrCl cannot be calculated (Unknown ideal weight.). Liver Function Tests: No results for input(s): "AST", "ALT", "ALKPHOS", "BILITOT", "PROT",  "ALBUMIN" in the last 168 hours. No results for input(s): "LIPASE", "AMYLASE" in the last 168 hours. No results for input(s): "AMMONIA" in the last 168 hours. Coagulation Profile: No results for input(s): "INR", "PROTIME" in the last 168 hours. Cardiac Enzymes: No results for input(s): "CKTOTAL", "CKMB", "CKMBINDEX", "TROPONINI" in the last 168 hours. BNP (last 3 results) No results for input(s): "PROBNP" in the last 8760 hours. HbA1C: No results for input(s): "HGBA1C" in the last 72 hours. CBG: No results for input(s): "GLUCAP" in the last 168 hours. Lipid Profile: No results for input(s): "CHOL", "HDL", "LDLCALC", "TRIG", "CHOLHDL", "LDLDIRECT" in the last 72 hours. Thyroid Function Tests: No results for input(s): "TSH", "T4TOTAL", "FREET4", "T3FREE", "THYROIDAB" in the last 72 hours. Anemia Panel: No results for input(s): "VITAMINB12", "FOLATE", "FERRITIN", "TIBC", "IRON", "RETICCTPCT" in the last 72 hours. Urinalysis    Component Value Date/Time   COLORURINE YELLOW 08/16/2023 1508   APPEARANCEUR HAZY (A) 08/16/2023 1508   APPEARANCEUR Clear 01/06/2018 1503   LABSPEC 1.020 08/16/2023 1508   PHURINE 5.5 08/16/2023 1508   GLUCOSEU >=500 (A) 08/16/2023 1508   HGBUR NEGATIVE 08/16/2023 1508   BILIRUBINUR NEGATIVE 08/16/2023 1508   BILIRUBINUR Negative 01/06/2018 1503   KETONESUR 15 (A) 08/16/2023 1508   PROTEINUR NEGATIVE 08/16/2023 1508   UROBILINOGEN 0.2 11/05/2017 1609   NITRITE NEGATIVE 08/16/2023 1508   LEUKOCYTESUR NEGATIVE 08/16/2023 1508    Unresulted Labs (From admission, onward)     Start     Ordered   09/25/23 1738  Urinalysis, Routine w reflex microscopic -Urine, Clean Catch  Once,   URGENT       Question:  Specimen Source  Answer:  Urine, Clean Catch   09/25/23 1738           Orders Placed This Encounter  Procedures  . CT HEAD WO CONTRAST    Place this order only IF head trauma is known/suspected and pt is taking anticoagulant.    Standing Status:    Standing    Number of Occurrences:   1    Order Specific Question:   Radiology Contrast Protocol - do NOT remove file path    Answer:   [email or web address]  . MR BRAIN WO CONTRAST    Standing Status:   Standing    Number of Occurrences:   1    Order Specific Question:   What is the patient's sedation requirement?    Answer:   No Sedation    Order Specific Question:   Does the patient have a pacemaker or implanted devices?    Answer:   No  . DG Chest Port 1 View    Standing Status:   Standing    Number of Occurrences:   1    Order Specific Question:   Symptom/Reason for Exam    Answer:   Syncope and collapse [780.2.ICD-9-CM]  . CT Angio Chest PE W and/or Wo Contrast    Standing Status:   Standing    Number of Occurrences:   1    Order Specific Question:   Does the patient have a contrast media/X-ray dye allergy?    Answer:   No    Order Specific Question:   If indicated for the ordered procedure, I authorize the administration of contrast media per Radiology protocol    Answer:   Yes    Order Specific Question:   Radiology Contrast Protocol - do NOT remove file path    Answer:   [email or web address]  . Basic metabolic panel    Standing Status:   Standing    Number of Occurrences:   1  . CBC    Standing Status:   Standing    Number of Occurrences:   1  . Urinalysis, Routine w reflex microscopic -Urine, Clean Catch    Standing Status:   Standing    Number of Occurrences:   1    Order Specific Question:   Specimen Source    Answer:   Urine, Clean Catch [76]  . Magnesium    Standing Status:   Standing    Number of Occurrences:   1  . Urinalysis, Complete w Microscopic -Urine, Random    Standing Status:   Standing    Number of Occurrences:   1    Order Specific Question:   Specimen Source    Answer:   Urine, Random [244]  . Lactic acid, plasma    Standing Status:   Standing    Number of Occurrences:   2   . D-dimer, quantitative    Standing Status:   Standing    Number of Occurrences:   1  . TSH    Standing Status:   Standing    Number of Occurrences:   1  . T4, free    Standing Status:   Standing    Number of Occurrences:   1  . Hepatic function panel    Standing Status:   Standing    Number of Occurrences:   1  . Hemoglobin A1c  Standing Status:   Standing    Number of Occurrences:   1  . CK    Standing Status:   Standing    Number of Occurrences:   1  . Procalcitonin    Standing Status:   Standing    Number of Occurrences:   1  . Ethanol    Standing Status:   Standing    Number of Occurrences:   1  . High sensitivity CRP    Standing Status:   Standing    Number of Occurrences:   1  . Diet heart healthy/carb modified Room service appropriate? Yes; Fluid consistency: Thin    Standing Status:   Standing    Number of Occurrences:   1    Order Specific Question:   Diet-HS Snack?    Answer:   Nothing    Order Specific Question:   Room service appropriate?    Answer:   Yes    Order Specific Question:   Fluid consistency:    Answer:   Thin  . Document Height and Actual Weight    Use scales to weigh patient, not stated or estimated weight.    Standing Status:   Standing    Number of Occurrences:   1  . Orthostatic vital signs    Standing Status:   Standing    Number of Occurrences:   18  . Cardiac monitoring    Standing Status:   Standing    Number of Occurrences:   1  . Maintain IV access    Standing Status:   Standing    Number of Occurrences:   1  . Vital signs    Standing Status:   Standing    Number of Occurrences:   1  . Orthostatic vital signs on admission    Standing Status:   Standing    Number of Occurrences:   1  . Notify physician (specify)    Standing Status:   Standing    Number of Occurrences:   20    Order Specific Question:   Notify Physician    Answer:   for pulse less than 55 or greater than 120    Order Specific Question:   Notify Physician     Answer:   for respiratory rate less than 12 or greater than 25    Order Specific Question:   Notify Physician    Answer:   for temperature greater than 100.5 F    Order Specific Question:   Notify Physician    Answer:   for urinary output less than 30 mL/hr for four hours    Order Specific Question:   Notify Physician    Answer:   for systolic BP less than 90 or greater than 160, diastolic BP less than 60 or greater than 100    Order Specific Question:   Notify Physician    Answer:   for new hypoxia w/ oxygen saturations < 88%  . Daily weights    Standing Status:   Standing    Number of Occurrences:   1  . Intake and output    Standing Status:   Standing    Number of Occurrences:   1  . Apply Syncope Care Plan    Standing Status:   Standing    Number of Occurrences:   1  . Initiate Oral Care Protocol    Standing Status:   Standing    Number of Occurrences:   1  . Initiate Carrier Fluid  Protocol    Standing Status:   Standing    Number of Occurrences:   1  . SCDs    Standing Status:   Standing    Number of Occurrences:   1    Order Specific Question:   Laterality    Answer:   Bilateral  . Strict bed rest    Standing Status:   Standing    Number of Occurrences:   1  . Swallow screen    Standing Status:   Standing    Number of Occurrences:   1  . Neuro checks    Standing Status:   Standing    Number of Occurrences:   1  . Full code    Standing Status:   Standing    Number of Occurrences:   1    Order Specific Question:   By:    Answer:   Other  . Consult to hospitalist    Standing Status:   Standing    Number of Occurrences:   1    Order Specific Question:   Place call to:    Answer:   [facility]    Order Specific Question:   Reason for Consult    Answer:   Admit    Order Specific Question:   Diagnosis/Clinical Info for Consult:    Answer:   weakness, HypoK  . CBG monitoring, ED    Standing Status:   Standing    Number of Occurrences:   1  . EKG 12-Lead    Standing  Status:   Standing    Number of Occurrences:   1  . ED EKG    Altered mental status    Standing Status:   Standing    Number of Occurrences:   1    Order Specific Question:   Reason for Exam    Answer:   Other (See Comments)  . ECHOCARDIOGRAM COMPLETE BUBBLE STUDY    Standing Status:   Standing    Number of Occurrences:   1    Order Specific Question:   Perflutren DEFINITY (image enhancing agent) should be administered unless hypersensitivity or allergy exist    Answer:   Administer Perflutren    Order Specific Question:   Will [facility] be the location of this test?    Answer:   Yes    Order Specific Question:   Please indicate who you request to read the nuc med / echo results.    Answer:   [facility] Unassigned    Order Specific Question:   Reason for exam    Answer:   Syncope 780.2 / R55  . Admit to Inpatient (patient's expected length of stay will be greater than 2 midnights or inpatient only procedure)    Standing Status:   Standing    Number of Occurrences:   1    Order Specific Question:   Hospital Area    Answer:   [facility] [[id number]]    Order Specific Question:   Level of Care    Answer:   Progressive [102]    Order Specific Question:   Admit to Progressive based on following criteria    Answer:   CARDIOVASCULAR & THORACIC of moderate stability with acute coronary syndrome symptoms/low risk myocardial infarction/hypertensive urgency/arrhythmias/heart failure potentially compromising stability and stable post cardiovascular intervention patients.    Order Specific Question:   Admit to Progressive based on following criteria    Answer:   NEUROLOGICAL AND NEUROSURGICAL complex patients with  significant risk of instability, who do not meet ICU criteria, yet require close observation or frequent assessment (< / = every 2 - 4 hours) with medical / nursing intervention.    Order Specific Question:   Covid Evaluation    Answer:   Asymptomatic - no recent  exposure (last 10 days) testing not required    Order Specific Question:   Diagnosis    Answer:   Syncope and collapse [780.2.ICD-9-CM]    Order Specific Question:   Admitting Physician    Answer:   Darrold Junker    Order Specific Question:   Attending Physician    Answer:   Darrold Junker    Order Specific Question:   Certification:    Answer:   I certify this patient will need inpatient services for at least 2 midnights    Order Specific Question:   Expected Medical Readiness    Answer:   09/28/2023  . Aspiration precautions    Standing Status:   Standing    Number of Occurrences:   1  . Fall precautions    Standing Status:   Standing    Number of Occurrences:   1    Medications  lactated ringers bolus 1,000 mL (1,000 mLs Intravenous New Bag/Given 09/25/23 2045)  potassium chloride 10 mEq in 100 mL IVPB (0 mEq Intravenous Stopped 09/25/23 2054)  potassium chloride SA (KLOR-CON M) CR tablet 40 mEq (40 mEq Oral Given 09/25/23 1928)    Radiological Exams on Admission: CT HEAD WO CONTRAST  Result Date: 09/25/2023 CLINICAL DATA:  dizziness EXAM: CT HEAD WITHOUT CONTRAST TECHNIQUE: Contiguous axial images were obtained from the base of the skull through the vertex without intravenous contrast. RADIATION DOSE REDUCTION: This exam was performed according to the departmental dose-optimization program which includes automated exposure control, adjustment of the mA and/or kV according to patient size and/or use of iterative reconstruction technique. COMPARISON:  None Available. FINDINGS: Brain: Patchy and confluent areas of decreased attenuation are noted throughout the deep and periventricular white matter of the cerebral hemispheres bilaterally, compatible with chronic microvascular ischemic disease. Right cerebellar lacunar infarction. No evidence of large-territorial acute infarction. No parenchymal hemorrhage. No mass lesion. No extra-axial collection. No mass effect or midline  shift. No hydrocephalus. Basilar cisterns are patent. Vascular: No hyperdense vessel. Atherosclerotic calcifications are present within the cavernous internal carotid and vertebral arteries. Skull: No acute fracture or focal lesion. Sinuses/Orbits: Left maxillary sinus mucosal thickening with wall hypertrophy. Otherwise paranasal sinuses and mastoid air cells are clear. Left orbital lens replacement. Otherwise the orbits are unremarkable. Other: None. IMPRESSION: 1. No acute intracranial abnormality. 2. Chronic left maxillary sinus disease. Electronically Signed   By: Tish Frederickson M.D.   On: 09/25/2023 19:34     Data Reviewed: Relevant notes from primary care and specialist visits, past discharge summaries as available in EHR, including Care Everywhere. Prior diagnostic testing as pertinent to current admission diagnoses Updated medications and problem lists for reconciliation ED course, including vitals, labs, imaging, treatment and response to treatment Triage notes, nursing and pharmacy notes and ED provider's notes Notable results as noted in HPI  Assessment and Plan: No notes have been filed under this hospital service. Service: Hospitalist       Prognosis: ***  DVT prophylaxis:  ***  Consults:  ***  Advance Care Planning:    Code Status: Not on file   Family Communication:  ***  Disposition Plan:  ***  Severity of Illness: {Observation/Inpatient:21159}  Author: Gertha Calkin, MD 09/25/2023 9:20 PM  For on call review [email or web address].

## 2023-09-26 ENCOUNTER — Inpatient Hospital Stay: Payer: Medicare PPO

## 2023-09-26 ENCOUNTER — Other Ambulatory Visit: Payer: Self-pay

## 2023-09-26 DIAGNOSIS — I82409 Acute embolism and thrombosis of unspecified deep veins of unspecified lower extremity: Secondary | ICD-10-CM | POA: Insufficient documentation

## 2023-09-26 DIAGNOSIS — R55 Syncope and collapse: Secondary | ICD-10-CM

## 2023-09-26 DIAGNOSIS — E669 Obesity, unspecified: Secondary | ICD-10-CM | POA: Insufficient documentation

## 2023-09-26 DIAGNOSIS — E66812 Obesity, class 2: Secondary | ICD-10-CM | POA: Insufficient documentation

## 2023-09-26 DIAGNOSIS — E878 Other disorders of electrolyte and fluid balance, not elsewhere classified: Secondary | ICD-10-CM | POA: Diagnosis present

## 2023-09-26 DIAGNOSIS — Z86718 Personal history of other venous thrombosis and embolism: Secondary | ICD-10-CM

## 2023-09-26 DIAGNOSIS — I2699 Other pulmonary embolism without acute cor pulmonale: Secondary | ICD-10-CM

## 2023-09-26 LAB — ETHANOL: Alcohol, Ethyl (B): <10 mg/dL (ref <10)

## 2023-09-26 LAB — POTASSIUM: Potassium: 3.5 mmol/L (ref 3.5–5.1)

## 2023-09-26 LAB — PROTIME-INR
INR: 1 (ref 0.8–1.2)
Prothrombin Time: 13.7 s (ref 11.4–15.2)

## 2023-09-26 LAB — URINALYSIS, COMPLETE (UACMP) WITH MICROSCOPIC
Bacteria, UA: NONE SEEN
Bilirubin Urine: NEGATIVE
Glucose, UA: 50 mg/dL — AB
Ketones, ur: NEGATIVE mg/dL
Leukocytes,Ua: NEGATIVE
Nitrite: NEGATIVE
Protein, ur: NEGATIVE mg/dL
Specific Gravity, Urine: 1.021 (ref 1.005–1.030)
Squamous Epithelial / HPF: 0 /[HPF] (ref 0–5)
pH: 7 (ref 5.0–8.0)

## 2023-09-26 LAB — GLUCOSE, CAPILLARY
Glucose-Capillary: 137 mg/dL — ABNORMAL HIGH (ref 70–99)
Glucose-Capillary: 166 mg/dL — ABNORMAL HIGH (ref 70–99)

## 2023-09-26 LAB — COMPREHENSIVE METABOLIC PANEL
ALT: 22 U/L (ref 0–44)
AST: 26 U/L (ref 15–41)
Albumin: 3.3 g/dL — ABNORMAL LOW (ref 3.5–5.0)
Alkaline Phosphatase: 47 U/L (ref 38–126)
Anion gap: 8 (ref 5–15)
BUN: 12 mg/dL (ref 8–23)
CO2: 29 mmol/L (ref 22–32)
Calcium: 7.9 mg/dL — ABNORMAL LOW (ref 8.9–10.3)
Chloride: 99 mmol/L (ref 98–111)
Creatinine, Ser: 1.12 mg/dL (ref 0.61–1.24)
GFR, Estimated: >60 mL/min (ref >60)
Glucose, Bld: 145 mg/dL — ABNORMAL HIGH (ref 70–99)
Potassium: 2.6 mmol/L — CL (ref 3.5–5.1)
Sodium: 136 mmol/L (ref 135–145)
Total Bilirubin: 0.9 mg/dL (ref <1.2)
Total Protein: 6.4 g/dL — ABNORMAL LOW (ref 6.5–8.1)

## 2023-09-26 LAB — BASIC METABOLIC PANEL
Anion gap: 10 (ref 5–15)
BUN: 9 mg/dL (ref 8–23)
CO2: 29 mmol/L (ref 22–32)
Calcium: 8.1 mg/dL — ABNORMAL LOW (ref 8.9–10.3)
Chloride: 96 mmol/L — ABNORMAL LOW (ref 98–111)
Creatinine, Ser: 1.14 mg/dL (ref 0.61–1.24)
GFR, Estimated: >60 mL/min (ref >60)
Glucose, Bld: 134 mg/dL — ABNORMAL HIGH (ref 70–99)
Potassium: 2.6 mmol/L — CL (ref 3.5–5.1)
Sodium: 135 mmol/L (ref 135–145)

## 2023-09-26 LAB — HEMOGLOBIN A1C
Hgb A1c MFr Bld: 7.2 % — ABNORMAL HIGH (ref 4.8–5.6)
Mean Plasma Glucose: 159.94 mg/dL

## 2023-09-26 LAB — CBG MONITORING, ED
Glucose-Capillary: 106 mg/dL — ABNORMAL HIGH (ref 70–99)
Glucose-Capillary: 133 mg/dL — ABNORMAL HIGH (ref 70–99)
Glucose-Capillary: 128 mg/dL — ABNORMAL HIGH (ref 70–99)

## 2023-09-26 LAB — TROPONIN I (HIGH SENSITIVITY)
Troponin I (High Sensitivity): 22 ng/L — ABNORMAL HIGH (ref <18)
Troponin I (High Sensitivity): 22 ng/L — ABNORMAL HIGH (ref <18)
Troponin I (High Sensitivity): 16 ng/L (ref <18)

## 2023-09-26 LAB — APTT: aPTT: 30 s (ref 24–36)

## 2023-09-26 LAB — LACTIC ACID, PLASMA: Lactic Acid, Venous: 2.9 mmol/L (ref 0.5–1.9)

## 2023-09-26 LAB — MAGNESIUM
Magnesium: 1.7 mg/dL (ref 1.7–2.4)
Magnesium: 2.1 mg/dL (ref 1.7–2.4)

## 2023-09-26 LAB — PROCALCITONIN: Procalcitonin: <0.1 ng/mL

## 2023-09-26 LAB — HEPARIN LEVEL (UNFRACTIONATED)
Heparin Unfractionated: 0.36 [IU]/mL (ref 0.30–0.70)
Heparin Unfractionated: 0.57 [IU]/mL (ref 0.30–0.70)

## 2023-09-26 LAB — CK: Total CK: 1151 U/L — ABNORMAL HIGH (ref 49–397)

## 2023-09-26 MED ORDER — HEPARIN (PORCINE) 25000 UT/250ML-% IV SOLN
1200.0000 [IU]/h | INTRAVENOUS | Status: DC
  Administered 2023-09-26 – 2023-09-27 (×3): 1200 [IU]/h via INTRAVENOUS
  Filled 2023-09-26 (×3): qty 250

## 2023-09-26 MED ORDER — POTASSIUM CHLORIDE 10 MEQ/100ML IV SOLN
10.0000 meq | INTRAVENOUS | Status: AC
  Administered 2023-09-26 (×4): 10 meq via INTRAVENOUS
  Filled 2023-09-26 (×3): qty 100

## 2023-09-26 MED ORDER — MAGNESIUM SULFATE 2 GM/50ML IV SOLN
2.0000 g | Freq: Once | INTRAVENOUS | Status: AC
  Administered 2023-09-26: 2 g via INTRAVENOUS
  Filled 2023-09-26: qty 50

## 2023-09-26 MED ORDER — POTASSIUM CHLORIDE CRYS ER 20 MEQ PO TBCR
40.0000 meq | EXTENDED_RELEASE_TABLET | Freq: Two times a day (BID) | ORAL | Status: DC
  Administered 2023-09-26: 40 meq via ORAL
  Filled 2023-09-26 (×2): qty 2

## 2023-09-26 MED ORDER — POTASSIUM CHLORIDE 10 MEQ/100ML IV SOLN
10.0000 meq | INTRAVENOUS | Status: AC
  Administered 2023-09-26 (×3): 10 meq via INTRAVENOUS
  Filled 2023-09-26 (×3): qty 100

## 2023-09-26 MED ORDER — LEVOTHYROXINE SODIUM 25 MCG PO TABS
125.0000 ug | ORAL_TABLET | Freq: Every day | ORAL | Status: DC
  Administered 2023-09-27: 125 ug via ORAL
  Filled 2023-09-26: qty 1

## 2023-09-26 MED ORDER — POTASSIUM CHLORIDE CRYS ER 20 MEQ PO TBCR
40.0000 meq | EXTENDED_RELEASE_TABLET | Freq: Once | ORAL | Status: AC
  Administered 2023-09-26: 40 meq via ORAL

## 2023-09-26 MED ORDER — INSULIN ASPART 100 UNIT/ML IJ SOLN
0.0000 [IU] | Freq: Three times a day (TID) | INTRAMUSCULAR | Status: DC
  Administered 2023-09-27 – 2023-10-01 (×6): 1 [IU] via SUBCUTANEOUS
  Filled 2023-09-26 (×6): qty 1

## 2023-09-26 MED ORDER — LEVOTHYROXINE SODIUM 50 MCG PO TABS: 125.0000 ug | ORAL_TABLET | Freq: Every day | ORAL | Status: DC

## 2023-09-26 MED ORDER — MAGNESIUM SULFATE IN D5W 1-5 GM/100ML-% IV SOLN
1.0000 g | Freq: Once | INTRAVENOUS | Status: AC
  Administered 2023-09-26: 1 g via INTRAVENOUS
  Filled 2023-09-26: qty 100

## 2023-09-26 MED ORDER — HEPARIN BOLUS VIA INFUSION
5000.0000 [IU] | Freq: Once | INTRAVENOUS | Status: AC
  Administered 2023-09-26: 5000 [IU] via INTRAVENOUS
  Filled 2023-09-26: qty 5000

## 2023-09-26 MED ORDER — PRAVASTATIN SODIUM 20 MG PO TABS: 10.0000 mg | ORAL_TABLET | Freq: Every day | ORAL | Status: DC

## 2023-09-26 MED ORDER — HYDRALAZINE HCL 20 MG/ML IJ SOLN: 10.0000 mg | Freq: Four times a day (QID) | INTRAMUSCULAR | Status: DC | PRN

## 2023-09-26 MED ORDER — LEVOTHYROXINE SODIUM 50 MCG PO TABS
50.0000 ug | ORAL_TABLET | Freq: Every day | ORAL | Status: DC
  Administered 2023-09-26: 50 ug via ORAL
  Filled 2023-09-26: qty 1

## 2023-09-26 MED ORDER — POTASSIUM CHLORIDE 10 MEQ/100ML IV SOLN
10.0000 meq | INTRAVENOUS | Status: AC
  Administered 2023-09-26 – 2023-09-27 (×4): 10 meq via INTRAVENOUS
  Filled 2023-09-26 (×4): qty 100

## 2023-09-26 MED ORDER — POTASSIUM CHLORIDE CRYS ER 20 MEQ PO TBCR
40.0000 meq | EXTENDED_RELEASE_TABLET | Freq: Once | ORAL | Status: AC
  Administered 2023-09-26: 40 meq via ORAL
  Filled 2023-09-26: qty 2

## 2023-09-26 NOTE — Assessment & Plan Note (Signed)
Differentials include medication, bradycardia, electrolyte related, infection related patient does not have any diarrhea nausea vomiting.  Fall precautions orthostatic vitals, head CT negative will obtain an MRI for CVA.

## 2023-09-26 NOTE — Progress Notes (Signed)
PHARMACY CONSULT NOTE - FOLLOW UP  Pharmacy Consult for Electrolyte Monitoring and Replacement   Recent Labs: Potassium (mmol/L)  Date Value  09/26/2023 2.6 (LL)   Magnesium (mg/dL)  Date Value  66/44/0347 1.7   Calcium (mg/dL)  Date Value  42/59/5638 7.9 (L)   Albumin (g/dL)  Date Value  75/64/3329 3.3 (L)  01/14/2023 4.5   Phosphorus (mg/dL)  Date Value  51/88/4166 2.1 (L)   Sodium (mmol/L)  Date Value  09/26/2023 136  01/14/2023 135     Assessment: 11/10:  K @ 0320 = 2.6  Goal of Therapy:  Electrolytes WNL   Plan:  - Will order KCl 40 mEq PO X 1 and                      KCl 10 mEq IV X 4   - recheck electrolytes @ 1200   Talal Fritchman D ,PharmD Clinical Pharmacist 09/26/2023 4:36 AM

## 2023-09-26 NOTE — Assessment & Plan Note (Signed)
Differentials of syncope and collapse in this patient include dehydration, hypertension, hypotension, cardiac related, hypoglycemia, infection, PE, dysrhythmia. We will obtain orthostatic vitals.   We will start with comprehensive blood work including but not limited to thyroid, lactic, dimer, cardiac enzymes.  MRI of the brain. Will obtain a 2D echocardiogram.

## 2023-09-26 NOTE — Consult Note (Signed)
PHARMACY CONSULT NOTE - ELECTROLYTES  Pharmacy Consult for Electrolyte Monitoring and Replacement   Recent Labs: Height: 5\' 7"  (170.2 cm) Weight: 102.3 kg (225 lb 9.6 oz) IBW/kg (Calculated) : 66.1 Estimated Creatinine Clearance: 63 mL/min (by C-G formula based on SCr of 1.12 mg/dL). Potassium (mmol/L)  Date Value  09/26/2023 3.5   Magnesium (mg/dL)  Date Value  08/65/7846 1.7   Calcium (mg/dL)  Date Value  96/29/5284 7.9 (L)   Albumin (g/dL)  Date Value  13/24/4010 3.3 (L)  01/14/2023 4.5   Phosphorus (mg/dL)  Date Value  27/25/3664 2.1 (L)   Sodium (mmol/L)  Date Value  09/26/2023 136  01/14/2023 135   Corrected Ca: 8.5 mg/dL  Assessment  Daniel Kirby is a 77 y.o. male presenting with lightheadedness and syncope. PMH significant for PE, T2DM, hypothyroidism. Pharmacy has been consulted to monitor and replace electrolytes.  Diet: Carb Modified; Thin MIVF: NA Pertinent medications: NA  Goal of Therapy: Electrolytes WNL  Plan:  No replacement needed at this time Check BMP, Mg, Phos with AM labs  Thank you for allowing pharmacy to be a part of this patient's care.  Effie Shy, PharmD Pharmacy Resident  09/26/2023 2:07 PM

## 2023-09-26 NOTE — Progress Notes (Signed)
PROGRESS NOTE    Daniel Kirby  WUJ:[id number] DOB: [birth year] DOA: 09/25/2023 PCP: Duanne Limerick, MD  Outpatient Specialists: none    Brief Narrative:   Presented with several weeks weakness and lightneadedness and syncope with ambulation and difficulty getting up from the ground and lower extremity swelling, found to have bilateral PE   Assessment & Plan:   Principal Problem:   Acute pulmonary embolism (HCC) Active Problems:   Syncope and collapse   Essential hypertension   Adult hypothyroidism   Type 2 diabetes mellitus without complication, without long-term current use of insulin (HCC)   Generalized weakness   Dizziness   Electrolyte abnormality  # Acute pulmonary embolism B/l, no evidence heart strain, has b/l leg swelling so suspect possible DVT as well. Hemodynamically stable - continue IV heparin - f/u TTE - f/u PVL lower extremities  # Debility - PT eval   # History prostate cancer Psa wnl last year, will re-check given new PE  # T2DM A1c in the 7s, euglycemic here - hold home orals - SSI  # Hypokalemia # Hypomagnesemia Denies vomiting/diarrhea, is on hydrochlorothiazide at home - replete today  # Hypothyroid Uncontrolled with elevated tsh and low t4, patient reports noncompliance w/ home synthroid - resume home synthroid, repeat TFTs 4-6 wks  # Adrenal nodule, incidental - outpt f/u  # Rhabdomyolysis Mild, 2/2 falls at home and inability to get up - trend  # HTN  Here normotensive - home losartan/hdtz on hold  # History prior strokes Seen on MRI, nothing acute - f/u ldl, continue current statin for now  # Obesity noted   DVT prophylaxis: IV heparin Code Status: full Family Communication: none at bedside, no answer when sister Liborio Nixon telephoned today  Level of care: Progressive Status is: Inpatient Remains inpatient appropriate because: need for onging inpt w/u    Consultants:  none  Procedures: none  Antimicrobials:   none    Subjective: Reports feeling fine at rest, no chest or other pain  Objective: Vitals:   09/26/23 0400 09/26/23 0640 09/26/23 0700 09/26/23 0800  BP: (!) 145/78 (!) 143/55 (!) 151/81 134/65  Pulse: (!) 51 72 (!) 54 (!) 55  Resp: 17 (!) 21 14 18   Temp: 98.4 F (36.9 C)     TempSrc: Oral     SpO2: 96% 95% 98% 98%  Weight:      Height:       No intake or output data in the 24 hours ending 09/26/23 0843 Filed Weights   09/26/23 0041  Weight: 102.3 kg    Examination:  General exam: Appears calm and comfortable  Respiratory system: Clear to auscultation. Respiratory effort normal. Cardiovascular system: S1 & S2 heard, RRR. No JVD, murmurs, rubs, gallops or clicks.   Gastrointestinal system: Abdomen is obese, soft and nontender. No organomegaly or masses felt. Central nervous system: Alert and oriented. No focal neurological deficits. Extremities: Symmetric 5 x 5 power. Pitting edema to knees Skin: No rashes, lesions or ulcers Psychiatry: Judgement and insight appear normal. Mood & affect appropriate.     Data Reviewed: I have personally reviewed following labs and imaging studies  CBC: Recent Labs  Lab 09/25/23 1738  WBC 9.8  HGB 13.0  HCT 37.0*  MCV 87.3  PLT 330   Basic Metabolic Panel: Recent Labs  Lab 09/25/23 1738 09/26/23 0320  NA 137 136  K 2.0* 2.6*  CL 96* 99  CO2 27 29  GLUCOSE 277* 145*  BUN 12 12  CREATININE 1.22 1.12  CALCIUM 8.5* 7.9*  MG 1.7 1.7   GFR: Estimated Creatinine Clearance: 63 mL/min (by C-G formula based on SCr of 1.12 mg/dL). Liver Function Tests: Recent Labs  Lab 09/25/23 2207 09/26/23 0320  AST 36 26  ALT 28 22  ALKPHOS 51 47  BILITOT 0.7 0.9  PROT 6.8 6.4*  ALBUMIN 3.5 3.3*   No results for input(s): "LIPASE", "AMYLASE" in the last 168 hours. No results for input(s): "AMMONIA" in the last 168 hours. Coagulation Profile: Recent Labs  Lab 09/25/23 2207  INR 1.0   Cardiac Enzymes: Recent Labs  Lab  09/25/23 2207  CKTOTAL 1,431*   BNP (last 3 results) No results for input(s): "PROBNP" in the last 8760 hours. HbA1C: Recent Labs    09/25/23 1738  HGBA1C 7.2*   CBG: Recent Labs  Lab 09/26/23 0739  GLUCAP 106*   Lipid Profile: No results for input(s): "CHOL", "HDL", "LDLCALC", "TRIG", "CHOLHDL", "LDLDIRECT" in the last 72 hours. Thyroid Function Tests: Recent Labs    09/25/23 2207  TSH 35.582*  FREET4 <0.25*   Anemia Panel: No results for input(s): "VITAMINB12", "FOLATE", "FERRITIN", "TIBC", "IRON", "RETICCTPCT" in the last 72 hours. Urine analysis:    Component Value Date/Time   COLORURINE YELLOW (A) 09/26/2023 0320   APPEARANCEUR CLEAR (A) 09/26/2023 0320   APPEARANCEUR Clear 01/06/2018 1503   LABSPEC 1.021 09/26/2023 0320   PHURINE 7.0 09/26/2023 0320   GLUCOSEU 50 (A) 09/26/2023 0320   HGBUR SMALL (A) 09/26/2023 0320   BILIRUBINUR NEGATIVE 09/26/2023 0320   BILIRUBINUR Negative 01/06/2018 1503   KETONESUR NEGATIVE 09/26/2023 0320   PROTEINUR NEGATIVE 09/26/2023 0320   UROBILINOGEN 0.2 11/05/2017 1609   NITRITE NEGATIVE 09/26/2023 0320   LEUKOCYTESUR NEGATIVE 09/26/2023 0320   Sepsis Labs: @LABRCNTIP (procalcitonin:4,lacticidven:4)  )No results found for this or any previous visit (from the past 240 hour(s)).       Radiology Studies: CT Angio Chest PE W and/or Wo Contrast  Result Date: 09/26/2023 CLINICAL DATA:  Elevated D-dimer.  Dizziness.  Syncope. EXAM: CT ANGIOGRAPHY CHEST WITH CONTRAST TECHNIQUE: Multidetector CT imaging of the chest was performed using the standard protocol during bolus administration of intravenous contrast. Multiplanar CT image reconstructions and MIPs were obtained to evaluate the vascular anatomy. RADIATION DOSE REDUCTION: This exam was performed according to the departmental dose-optimization program which includes automated exposure control, adjustment of the mA and/or kV according to patient size and/or use of iterative  reconstruction technique. CONTRAST:  OMNIPAQUE IOHEXOL 350 MG/ML SOLN COMPARISON:  Chest radiograph earlier today FINDINGS: Cardiovascular: Lobar and segmental pulmonary emboli in the right upper and lower lobe pulmonary arteries. Segmental pulmonary emboli in the lingular and left lower lobe pulmonary arteries. No evidence of right heart strain. No pericardial effusion. Mediastinum/Nodes: Trachea and esophagus are unremarkable. No thoracic adenopathy. Lungs/Pleura: Expiratory phase exam. Bronchial wall thickening and mucous plugging. Bibasilar atelectasis. No pleural effusion or pneumothorax. Upper Abdomen: No acute abnormality. 1.5 cm left adrenal nodule with Hounsfield units of 29. Musculoskeletal: No acute fracture. Review of the MIP images confirms the above findings. IMPRESSION: 1. Bilateral lobar and segmental pulmonary emboli. No evidence of right heart strain. 2. Bronchial wall thickening and mucous plugging. 3. 1.5 cm left adrenal nodule. This is indeterminate but likely represents a benign adenoma. Follow-up in 12 months with adrenal protocol CT is recommended. Critical Value/emergent results were called by telephone at the time of interpretation on 09/26/2023 at 12:19 am to provider Manuela Schwartz, NP, who verbally acknowledged these results.  Electronically Signed   By: Minerva Fester M.D.   On: 09/26/2023 00:19   MR BRAIN WO CONTRAST  Result Date: 09/25/2023 CLINICAL DATA:  Initial evaluation for syncope/presyncope, dizziness. EXAM: MRI HEAD WITHOUT CONTRAST TECHNIQUE: Multiplanar, multiecho pulse sequences of the brain and surrounding structures were obtained without intravenous contrast. COMPARISON:  Prior CT from earlier the same day. FINDINGS: Brain: Mild age-related cerebral atrophy with chronic small vessel ischemic disease. Small remote infarcts involving the right frontal lobe and right cerebellum. No evidence for acute or subacute ischemia. No acute intracranial hemorrhage. Few  punctate chronic micro hemorrhages noted, likely hypertensive/small vessel related. No mass lesion, midline shift or mass effect. No hydrocephalus or extra-axial fluid collection. Pituitary gland within normal limits. Vascular: Major intracranial vascular flow voids are maintained. Skull and upper cervical spine: Craniocervical junction within normal limits. Bone marrow signal intensity within normal limits. No scalp soft tissue abnormality. Chronic mucosal thickening present about the ethmoidal air cells and left greater than right maxillary sinuses. No mastoid effusion. Other: None. IMPRESSION: 1. No acute intracranial abnormality. 2. Mild age-related cerebral atrophy with chronic small vessel ischemic disease, with small remote infarcts involving the right frontal lobe and right cerebellum. Electronically Signed   By: Rise Mu M.D.   On: 09/25/2023 23:40   DG Chest Port 1 View  Result Date: 09/25/2023 CLINICAL DATA:  Syncope EXAM: PORTABLE CHEST 1 VIEW COMPARISON:  Chest x-ray 12/19/2018 FINDINGS: The heart size and mediastinal contours are within normal limits. Both lungs are clear. The visualized skeletal structures are unremarkable. IMPRESSION: No active disease. Electronically Signed   By: Darliss Cheney M.D.   On: 09/25/2023 22:48   CT HEAD WO CONTRAST  Result Date: 09/25/2023 CLINICAL DATA:  dizziness EXAM: CT HEAD WITHOUT CONTRAST TECHNIQUE: Contiguous axial images were obtained from the base of the skull through the vertex without intravenous contrast. RADIATION DOSE REDUCTION: This exam was performed according to the departmental dose-optimization program which includes automated exposure control, adjustment of the mA and/or kV according to patient size and/or use of iterative reconstruction technique. COMPARISON:  None Available. FINDINGS: Brain: Patchy and confluent areas of decreased attenuation are noted throughout the deep and periventricular white matter of the cerebral  hemispheres bilaterally, compatible with chronic microvascular ischemic disease. Right cerebellar lacunar infarction. No evidence of large-territorial acute infarction. No parenchymal hemorrhage. No mass lesion. No extra-axial collection. No mass effect or midline shift. No hydrocephalus. Basilar cisterns are patent. Vascular: No hyperdense vessel. Atherosclerotic calcifications are present within the cavernous internal carotid and vertebral arteries. Skull: No acute fracture or focal lesion. Sinuses/Orbits: Left maxillary sinus mucosal thickening with wall hypertrophy. Otherwise paranasal sinuses and mastoid air cells are clear. Left orbital lens replacement. Otherwise the orbits are unremarkable. Other: None. IMPRESSION: 1. No acute intracranial abnormality. 2. Chronic left maxillary sinus disease. Electronically Signed   By: Tish Frederickson M.D.   On: 09/25/2023 19:34        Scheduled Meds:  insulin aspart  0-6 Units Subcutaneous TID WC   sodium chloride flush  3 mL Intravenous Q12H   Continuous Infusions:  heparin 1,200 Units/hr (09/26/23 0107)   potassium chloride 10 mEq (09/26/23 0738)     LOS: 1 day     Silvano Bilis, MD [facility]   If 7PM-7AM, please contact night-coverage [email or web address] Password [facility] 09/26/2023, 8:43 AM

## 2023-09-26 NOTE — ED Notes (Signed)
Pt ask for something to drink, OJ, provided

## 2023-09-26 NOTE — Assessment & Plan Note (Signed)
Potassium replaced in the emergency room will request pharmacy consult to manage patient closely.

## 2023-09-26 NOTE — Assessment & Plan Note (Signed)
Vitals:   09/25/23 1736 09/25/23 1835 09/25/23 2130  BP: (!) 123/97 136/60 (!) 150/75  We will hold patient's losartan HCTZ and start patient on as needed hydralazine. Monitor blood pressure and heart rate on progressive unit.

## 2023-09-26 NOTE — Progress Notes (Addendum)
PHARMACY - ANTICOAGULATION CONSULT NOTE  Pharmacy Consult for Heparin  Indication: pulmonary embolus  No Known Allergies  Patient Measurements: Height: 5\' 7"  (170.2 cm) Weight: 102.3 kg (225 lb 9.6 oz) IBW/kg (Calculated) : 66.1 Heparin Dosing Weight: 88.5   Vital Signs: Temp: 98.3 F (36.8 C) (11/10 0920) Temp Source: Oral (11/10 0920) BP: 137/82 (11/10 1430) Pulse Rate: 52 (11/10 1430)  Labs: Recent Labs    09/25/23 1738 09/25/23 2207 09/25/23 2207 09/26/23 0050 09/26/23 0319 09/26/23 0320 09/26/23 0910 09/26/23 1300 09/26/23 1640  HGB 13.0  --   --   --   --   --   --   --   --   HCT 37.0*  --   --   --   --   --   --   --   --   PLT 330  --   --   --   --   --   --   --   --   APTT  --  30  --   --   --   --   --   --   --   LABPROT  --  13.7  --   --   --   --   --   --   --   INR  --  1.0  --   --   --   --   --   --   --   HEPARINUNFRC  --   --   --   --   --   --  0.36  --  0.57  CREATININE 1.22  --   --   --   --  1.12  --   --   --   CKTOTAL  --  1,431*  --   --  1,151*  --   --   --   --   TROPONINIHS  --  20*   < > 22*  --  22*  --  16  --    < > = values in this interval not displayed.    Estimated Creatinine Clearance: 63 mL/min (by C-G formula based on SCr of 1.12 mg/dL).   Medical History: Past Medical History:  Diagnosis Date   Abdominal pain    RUQ   Arthritis    right ankle   ED (erectile dysfunction)    HLD (hyperlipidemia)    Hypertension    Hypothyroid    Prostate cancer (HCC)    Vertigo    1 episode only, approx 1 month ago    Medications:  (Not in a hospital admission)   Assessment: Pharmacy consulted to dose heparin in this 77 year old male admitted with PE.  No prior anticoag noted. CrCl = 57.8 ml/min   11/10 0910 HL 0.36,  therapeutic x1 11/10 1640 HL 0.57, therapeutic x2   Goal of Therapy:  Heparin level 0.3-0.7 units/ml Monitor platelets by anticoagulation protocol: Yes   Plan:  Continue heparin infusion at  1200 units/hr Will transition to daily heparin levels. Next heparin level tomorrow AM Continue to monitor H&H and platelets   Elliot Gurney, PharmD, BCPS Clinical Pharmacist  09/26/2023 5:24 PM

## 2023-09-26 NOTE — Progress Notes (Signed)
PHARMACY - ANTICOAGULATION CONSULT NOTE  Pharmacy Consult for Heparin  Indication: pulmonary embolus  No Known Allergies  Patient Measurements: Height: 5\' 7"  (170.2 cm) Weight: 102.3 kg (225 lb 9.6 oz) IBW/kg (Calculated) : 66.1 Heparin Dosing Weight: 88.5   Vital Signs: Temp: 98.1 F (36.7 C) (11/10 0006) Temp Source: Oral (11/10 0006) BP: 140/77 (11/10 0006) Pulse Rate: 95 (11/10 0006)  Labs: Recent Labs    09/25/23 1738 09/25/23 2207  HGB 13.0  --   HCT 37.0*  --   PLT 330  --   CREATININE 1.22  --   CKTOTAL  --  1,431*  TROPONINIHS  --  20*    Estimated Creatinine Clearance: 57.8 mL/min (by C-G formula based on SCr of 1.22 mg/dL).   Medical History: Past Medical History:  Diagnosis Date   Abdominal pain    RUQ   Arthritis    right ankle   ED (erectile dysfunction)    HLD (hyperlipidemia)    Hypertension    Hypothyroid    Prostate cancer (HCC)    Vertigo    1 episode only, approx 1 month ago    Medications:  (Not in a hospital admission)   Assessment: Pharmacy consulted to dose heparin in this 77 year old male admitted with PE.  No prior anticoag noted. CrCl = 57.8 ml/min   Goal of Therapy:  Heparin level 0.3-0.7 units/ml Monitor platelets by anticoagulation protocol: Yes   Plan:  Give 5000 units bolus x 1 Start heparin infusion at 1200 units/hr Check anti-Xa level in 8 hours and daily while on heparin Continue to monitor H&H and platelets  Francisco Eyerly D 09/26/2023,12:44 AM

## 2023-09-26 NOTE — ED Notes (Signed)
Received report from [place], Charity fundraiser. This RN introduce self to pt and pt denied any needs at this time, resting in bed, VSS.

## 2023-09-26 NOTE — ED Notes (Signed)
Pt resting in bed, family at bedside. Pt given water and denies other needs at this time.

## 2023-09-26 NOTE — Progress Notes (Signed)
PHARMACY CONSULT NOTE - FOLLOW UP  Pharmacy Consult for Electrolyte Monitoring and Replacement   Recent Labs: Potassium (mmol/L)  Date Value  09/25/2023 2.0 (LL)   Magnesium (mg/dL)  Date Value  84/13/2440 1.7   Calcium (mg/dL)  Date Value  09/12/2535 8.5 (L)   Albumin (g/dL)  Date Value  64/40/3474 3.5  01/14/2023 4.5   Phosphorus (mg/dL)  Date Value  25/95/6387 2.1 (L)   Sodium (mmol/L)  Date Value  09/25/2023 137  01/14/2023 135     Assessment: 11/09:  K @ 1738 = 2.0  Goal of Therapy:  Electrolytes WNL   Plan:  KCl 40 mEq PO X 1 given on 11/09 @ 1909 KCl 10 mEq IV X 1 given on 11/09 @ 1922 - will order additional KCl 10 mEq IV X 3  - will recheck electrolytes on 11/10 @ 0500   Kysean Sweet D ,PharmD Clinical Pharmacist 09/26/2023 1:08 AM

## 2023-09-26 NOTE — Assessment & Plan Note (Signed)
Monitor electrolytes, CPK, fall precautions. PT eval prior to discharge pt may need placement.

## 2023-09-26 NOTE — ED Notes (Signed)
Pt alert and oriented at this time, pt informed to call out if he has any weird feelings. Pt states he felt like he had a similar episode last week to what had happened today

## 2023-09-26 NOTE — Progress Notes (Signed)
       CROSS COVER NOTE  NAME: LIAN DEFREES MRN: [id number] DOB : 03/07/1946    Concern as stated by nurse / staff     Page from [facility] Radiology  Bilateral lobar and segmental Pes, no right heart strain   Pertinent findings on chart review:   Assessment and  Interventions   Assessment:  Plan: Initiate heparin per pharmacy consult X X      Donnie Mesa NP [facility] Cross Cover 7pm-7am - check amion for availability Pager [phone]

## 2023-09-26 NOTE — Consult Note (Signed)
PHARMACY CONSULT NOTE - ELECTROLYTES  Pharmacy Consult for Electrolyte Monitoring and Replacement   Recent Labs: Height: 5\' 7"  (170.2 cm) Weight: 102.3 kg (225 lb 9.6 oz) IBW/kg (Calculated) : 66.1 Estimated Creatinine Clearance: 61.9 mL/min (by C-G formula based on SCr of 1.14 mg/dL). Potassium (mmol/L)  Date Value  09/26/2023 2.6 (LL)   Magnesium (mg/dL)  Date Value  78/29/5621 2.1   Calcium (mg/dL)  Date Value  30/86/5784 8.1 (L)   Albumin (g/dL)  Date Value  69/62/9528 3.3 (L)  01/14/2023 4.5   Phosphorus (mg/dL)  Date Value  41/32/4401 2.1 (L)   Sodium (mmol/L)  Date Value  09/26/2023 135  01/14/2023 135   Corrected Ca: 8.5 mg/dL  Assessment  Daniel Kirby is a 77 y.o. male presenting with lightheadedness and syncope. PMH significant for PE, T2DM, hypothyroidism. Pharmacy has been consulted to monitor and replace electrolytes.  Diet: Carb Modified; Thin MIVF: NA Pertinent medications: NA  Goal of Therapy: Electrolytes WNL  Plan:  11/10: K @ 2043 = 2.6 - Will order KCl 40 mEq PO X 1 and KCl 10 mEq IV X 4 for 11/10 @ 2200 - Will recheck electrolytes with AM labs  Thank you for allowing pharmacy to be a part of this patient's care.  Wilgus Deyton D, PharmD 09/26/2023 10:10 PM

## 2023-09-26 NOTE — Progress Notes (Addendum)
Blood pressure 132/77, pulse 63, temperature 98.1 F (36.7 C), temperature source Oral, resp. rate 16, height 5\' 7"  (1.702 m), weight 102.3 kg, SpO2 98%.  Results for orders placed or performed during the hospital encounter of 09/25/23 (from the past 24 hour(s))  Basic metabolic panel     Status: Abnormal   Collection Time: 09/25/23  5:38 PM  Result Value Ref Range   Sodium 137 135 - 145 mmol/L   Potassium 2.0 (LL) 3.5 - 5.1 mmol/L   Chloride 96 (L) 98 - 111 mmol/L   CO2 27 22 - 32 mmol/L   Glucose, Bld 277 (H) 70 - 99 mg/dL   BUN 12 8 - 23 mg/dL   Creatinine, Ser 4.09 0.61 - 1.24 mg/dL   Calcium 8.5 (L) 8.9 - 10.3 mg/dL   GFR, Estimated >81 >19 mL/min   Anion gap 14 5 - 15  CBC     Status: Abnormal   Collection Time: 09/25/23  5:38 PM  Result Value Ref Range   WBC 9.8 4.0 - 10.5 K/uL   RBC 4.24 4.22 - 5.81 MIL/uL   Hemoglobin 13.0 13.0 - 17.0 g/dL   HCT 14.7 (L) 82.9 - 56.2 %   MCV 87.3 80.0 - 100.0 fL   MCH 30.7 26.0 - 34.0 pg   MCHC 35.1 30.0 - 36.0 g/dL   RDW 13.0 86.5 - 78.4 %   Platelets 330 150 - 400 K/uL   nRBC 0.0 0.0 - 0.2 %  Magnesium     Status: None   Collection Time: 09/25/23  5:38 PM  Result Value Ref Range   Magnesium 1.7 1.7 - 2.4 mg/dL  Lactic acid, plasma     Status: Abnormal   Collection Time: 09/25/23 10:07 PM  Result Value Ref Range   Lactic Acid, Venous 3.4 (HH) 0.5 - 1.9 mmol/L  Troponin I (High Sensitivity)     Status: Abnormal   Collection Time: 09/25/23 10:07 PM  Result Value Ref Range   Troponin I (High Sensitivity) 20 (H) <18 ng/L  D-dimer, quantitative     Status: Abnormal   Collection Time: 09/25/23 10:07 PM  Result Value Ref Range   D-Dimer, Quant 9.18 (H) 0.00 - 0.50 ug/mL-FEU  TSH     Status: Abnormal   Collection Time: 09/25/23 10:07 PM  Result Value Ref Range   TSH 35.582 (H) 0.350 - 4.500 uIU/mL  T4, free     Status: Abnormal   Collection Time: 09/25/23 10:07 PM  Result Value Ref Range   Free T4 <0.25 (L) 0.61 - 1.12 ng/dL   Hepatic function panel     Status: None   Collection Time: 09/25/23 10:07 PM  Result Value Ref Range   Total Protein 6.8 6.5 - 8.1 g/dL   Albumin 3.5 3.5 - 5.0 g/dL   AST 36 15 - 41 U/L   ALT 28 0 - 44 U/L   Alkaline Phosphatase 51 38 - 126 U/L   Total Bilirubin 0.7 <1.2 mg/dL   Bilirubin, Direct 0.1 0.0 - 0.2 mg/dL   Indirect Bilirubin 0.6 0.3 - 0.9 mg/dL  CK     Status: Abnormal   Collection Time: 09/25/23 10:07 PM  Result Value Ref Range   Total CK 1,431 (H) 49 - 397 U/L  APTT     Status: None   Collection Time: 09/25/23 10:07 PM  Result Value Ref Range   aPTT 30 24 - 36 seconds  Protime-INR     Status: None   Collection Time:  09/25/23 10:07 PM  Result Value Ref Range   Prothrombin Time 13.7 11.4 - 15.2 seconds   INR 1.0 0.8 - 1.2  Troponin I (High Sensitivity)     Status: Abnormal   Collection Time: 09/26/23 12:50 AM  Result Value Ref Range   Troponin I (High Sensitivity) 22 (H) <18 ng/L  Lactic acid, plasma     Status: Abnormal   Collection Time: 09/26/23 12:55 AM  Result Value Ref Range   Lactic Acid, Venous 2.9 (HH) 0.5 - 1.9 mmol/L    Results: Rhabdomyolysis: 2/2 to fall / trauma.  Cont with mivf.   Elevated lactic acid: 2/2 to low perfusion and low blood pressure.    Head MR shows remote infarct which may have been when he fell two weeks ago.    D-dimer: CTA positive for BL PE.  Started heparin drip.  Abnormal TFT: Pt has been off levothyroxine for one month as he has had difficulty ambulating and is afraid of driving.  We have started levothyroxine at 50 mcg.  PCP to up titrate.   Pt and family do not know these results and will request daytime MD to followup and discuss with both.   Addl critical care time is 20 minutes.

## 2023-09-26 NOTE — Progress Notes (Signed)
PT Cancellation Note  Patient Details Name: Daniel Kirby MRN: [id number] DOB: Jul 12, 1946   Cancelled Treatment:    Reason Eval/Treat Not Completed: Patient not medically ready.  PT consult received.  Chart reviewed.  Pt noted with B PE's (heparin infusion initiated 0107 this morning 09/26/23).  Pt's K+ also noted to be 2.6 this morning.  Per PT guidelines for acute PE's and low K+, will hold PT at this time and re-attempt PT evaluation at a later date/time as medically appropriate.  Hendricks Limes, PT 09/26/23, 10:18 AM

## 2023-09-26 NOTE — ED Notes (Signed)
This RN got a call about pt increased heart rate, RN went to check on pt. Echo tech at bedside, HR 68 upon RN arrival. Echo tech stated that pt had a 'shaking/seizure like' activity episode. Pt reports he did feel like he was going to pass out. RN reviewed rhythm strips showing V Tach. Hospitalist paged, EKG uploaded, rhythms printed. Dr. Fanny Bien at bedside, pads pt placed on.

## 2023-09-26 NOTE — ED Provider Notes (Addendum)
Approximately 12:40 PM, nursing notified me of a concern noted on telemetry.  On review of telemetry tracing, the patient has evidence of prolonged ventricular tachycardia.  Evidently during this period of time he was getting an echocardiogram and the sonographer noted to nursing that seem like he became less responsive briefly.  The patient is currently awake and alert without ongoing ventricular tachycardia.  Appears to have self resolved.  I saw him and evaluated him quickly after reviewing telemetry tracing with nursing.  I am suspicious, and the patient reports he felt very weak while they were doing the ultrasound of his heart at 1 point, that he had nonsustained ventricular tachycardia.  He is currently awake alert without evidence of ongoing VT. Has PE and hypokalemia.  Defibrillator pads in place remains on telemetry.  Updated and notify Dr. Ashok Pall, of the admitting hospitalist service at 12:50 PM.  He acknowledged this and advises he will be consulting with the ICU and cardiology shortly, further care being addressed by the hospitalist.  The patient is at this point hemodynamically stable at approximately 12:30 PM being monitored very closely.  His potassium being repleted, repeat potassium level is pending.  Patient awake alert well-oriented pleasant at this time  CRITICAL CARE Performed by: Sharyn Creamer   Total critical care time: 15 minutes  Critical care time was exclusive of separately billable procedures and treating other patients.  Critical care was necessary to treat or prevent imminent or life-threatening deterioration.  Critical care was time spent personally by me on the following activities: development of treatment plan with patient and/or surrogate as well as nursing, discussions with consultants, evaluation of patient's response to treatment, examination of patient, obtaining history from patient or surrogate, ordering and performing treatments and interventions, ordering and  review of laboratory studies, ordering and review of radiographic studies, pulse oximetry and re-evaluation of patient's condition.    Sharyn Creamer, MD 09/26/23 Daniel Kirby    Sharyn Creamer, MD 09/26/23 [phone]

## 2023-09-26 NOTE — Progress Notes (Signed)
Patient HR decreased to the 20's. Patient alert and oriented. Asymptomatic, HR elevated to Ventricular tachycardia 170's. Rapid response called, NP made aware. Code  cart brought to the room and pads placed on the patient.  NP at bedside. Plan to continue monitoring the patient as this behavior has happened earlier today in the ED.  Family at the bedside. NP discussed patient care with them as well.  Patient is resting quietly.  Denies pain as well as discomfort. Complains of some slight dizzyness.

## 2023-09-26 NOTE — Assessment & Plan Note (Signed)
We will obtain a TSH and a free T4. Continue levothyroxine at 125 after verification that patient has actually been taking it.

## 2023-09-26 NOTE — Assessment & Plan Note (Signed)
We will hold home medication regimen of metformin and glimepiride to prevent hypoglycemia and patient having lactic acidosis possibility due to hypotension. Will obtain a swallow eval and start patient on a carb consistent cardiac diet.

## 2023-09-26 NOTE — Progress Notes (Signed)
PHARMACY - ANTICOAGULATION CONSULT NOTE  Pharmacy Consult for Heparin  Indication: pulmonary embolus  No Known Allergies  Patient Measurements: Height: 5\' 7"  (170.2 cm) Weight: 102.3 kg (225 lb 9.6 oz) IBW/kg (Calculated) : 66.1 Heparin Dosing Weight: 88.5   Vital Signs: Temp: 98.3 F (36.8 C) (11/10 0920) Temp Source: Oral (11/10 0920) BP: 136/65 (11/10 1000) Pulse Rate: 56 (11/10 1000)  Labs: Recent Labs    09/25/23 1738 09/25/23 2207 09/26/23 0050 09/26/23 0319 09/26/23 0320 09/26/23 0910  HGB 13.0  --   --   --   --   --   HCT 37.0*  --   --   --   --   --   PLT 330  --   --   --   --   --   APTT  --  30  --   --   --   --   LABPROT  --  13.7  --   --   --   --   INR  --  1.0  --   --   --   --   HEPARINUNFRC  --   --   --   --   --  0.36  CREATININE 1.22  --   --   --  1.12  --   CKTOTAL  --  1,431*  --  1,151*  --   --   TROPONINIHS  --  20* 22*  --  22*  --     Estimated Creatinine Clearance: 63 mL/min (by C-G formula based on SCr of 1.12 mg/dL).   Medical History: Past Medical History:  Diagnosis Date   Abdominal pain    RUQ   Arthritis    right ankle   ED (erectile dysfunction)    HLD (hyperlipidemia)    Hypertension    Hypothyroid    Prostate cancer (HCC)    Vertigo    1 episode only, approx 1 month ago    Medications:  (Not in a hospital admission)   Assessment: Pharmacy consulted to dose heparin in this 77 year old male admitted with PE.  No prior anticoag noted. CrCl = 57.8 ml/min   11/10 0910 HL 0.36,  therapeutic x1   Goal of Therapy:  Heparin level 0.3-0.7 units/ml Monitor platelets by anticoagulation protocol: Yes   Plan:  11/10 0910 HL 0.36,  therapeutic x1 Continue heparin infusion at 1200 units/hr Check confirmatory anti-Xa level in 8 hours and daily while on heparin Continue to monitor H&H and platelets  Bari Mantis PharmD Clinical Pharmacist 09/26/2023

## 2023-09-26 NOTE — Progress Notes (Signed)
       CROSS COVER NOTE  NAME: Daniel Kirby MRN: [id number] DOB : 11-13-1946    Date of Service   09/26/2023   HPI/Events of Note   Nursing called a rapid response because patient had a run of nonsustained ventricular tachycardia on the telemetry monitor.  Chart review revealed that patient had a similar episode during the day and had a cardiology consult placed to be seen in the AM. Review of the telemetry monitor showed patient been having episodes of bradycardia in the 30s.  Review of patient's chart shows that he has severe hypothyroidism with TSH greater than 35.  Patient notably noncompliant with medications for greater than a month at home.  Patient was alert on assessment at bedside denied chest pain at that time.  Patient remained hemodynamically stable with good blood pressure.    Interventions    Labs sent for electrolytes, twelve-lead ECG completed, patient placed on ZOLL pads. Cardiology was consulted and they will follow patient in the morning.  They recommended considering amiodarone if ventricular tachycardia is sustained. Patient's potassium resulted at 2.6.  Replacement was ordered by pharmacy per protocol. Considerations were made to transfer patient to stepdown or ICU, but patient was hemodynamically stable. Considered possibly administering intravenous levothyroxine, but potential side effects were considered and the decision will be deferred to a.m. team and cardiology. Patient remained stable through the night without any complications. Family was at bedside and they were updated on the plan of care and all questions answered.       Olanda Downie Lamin Geradine Girt, MSN, APRN, AGACNP-BC [facility] [place] Pager: [phone]. Check Amion for Availability

## 2023-09-27 ENCOUNTER — Other Ambulatory Visit (HOSPITAL_COMMUNITY): Payer: Self-pay

## 2023-09-27 ENCOUNTER — Inpatient Hospital Stay (HOSPITAL_COMMUNITY)
Admit: 2023-09-27 | Discharge: 2023-09-27 | Disposition: A | Discharge status: Home / Self Care | Payer: Medicare PPO | Attending: Internal Medicine | Admitting: Internal Medicine

## 2023-09-27 ENCOUNTER — Ambulatory Visit: Payer: Medicare PPO | Admitting: Urology

## 2023-09-27 DIAGNOSIS — I4729 Other ventricular tachycardia: Secondary | ICD-10-CM | POA: Diagnosis not present

## 2023-09-27 DIAGNOSIS — N3941 Urge incontinence: Secondary | ICD-10-CM

## 2023-09-27 DIAGNOSIS — I2699 Other pulmonary embolism without acute cor pulmonale: Secondary | ICD-10-CM | POA: Diagnosis not present

## 2023-09-27 DIAGNOSIS — E876 Hypokalemia: Secondary | ICD-10-CM | POA: Diagnosis not present

## 2023-09-27 DIAGNOSIS — E039 Hypothyroidism, unspecified: Secondary | ICD-10-CM | POA: Diagnosis not present

## 2023-09-27 DIAGNOSIS — C61 Malignant neoplasm of prostate: Secondary | ICD-10-CM

## 2023-09-27 DIAGNOSIS — I4581 Long QT syndrome: Secondary | ICD-10-CM

## 2023-09-27 DIAGNOSIS — N5231 Erectile dysfunction following radical prostatectomy: Secondary | ICD-10-CM

## 2023-09-27 DIAGNOSIS — R001 Bradycardia, unspecified: Secondary | ICD-10-CM

## 2023-09-27 DIAGNOSIS — R55 Syncope and collapse: Secondary | ICD-10-CM | POA: Diagnosis not present

## 2023-09-27 LAB — GLUCOSE, CAPILLARY
Glucose-Capillary: 117 mg/dL — ABNORMAL HIGH (ref 70–99)
Glucose-Capillary: 158 mg/dL — ABNORMAL HIGH (ref 70–99)
Glucose-Capillary: 136 mg/dL — ABNORMAL HIGH (ref 70–99)
Glucose-Capillary: 140 mg/dL — ABNORMAL HIGH (ref 70–99)

## 2023-09-27 LAB — ECHOCARDIOGRAM COMPLETE BUBBLE STUDY
AR max vel: 2.1 cm2
AV Area VTI: 2.4 cm2
AV Area mean vel: 2.16 cm2
AV Mean grad: 3 mm[Hg]
AV Peak grad: 5.9 mm[Hg]
Ao pk vel: 1.21 m/s
Area-P 1/2: 2.87 cm2
Calc EF: 46.6 %
MV VTI: 2.54 cm2
S' Lateral: 2.7 cm
Single Plane A2C EF: 41.4 %
Single Plane A4C EF: 48.4 %

## 2023-09-27 LAB — LIPID PANEL
Cholesterol: 198 mg/dL (ref 0–200)
HDL: 42 mg/dL (ref >40)
LDL Cholesterol: 130 mg/dL — ABNORMAL HIGH (ref 0–99)
Total CHOL/HDL Ratio: 4.7 {ratio}
Triglycerides: 131 mg/dL (ref <150)
VLDL: 26 mg/dL (ref 0–40)

## 2023-09-27 LAB — MAGNESIUM: Magnesium: 2 mg/dL (ref 1.7–2.4)

## 2023-09-27 LAB — POTASSIUM: Potassium: 3.1 mmol/L — ABNORMAL LOW (ref 3.5–5.1)

## 2023-09-27 LAB — BASIC METABOLIC PANEL
Anion gap: 9 (ref 5–15)
BUN: 10 mg/dL (ref 8–23)
CO2: 30 mmol/L (ref 22–32)
Calcium: 8 mg/dL — ABNORMAL LOW (ref 8.9–10.3)
Chloride: 97 mmol/L — ABNORMAL LOW (ref 98–111)
Creatinine, Ser: 0.94 mg/dL (ref 0.61–1.24)
GFR, Estimated: >60 mL/min (ref >60)
Glucose, Bld: 117 mg/dL — ABNORMAL HIGH (ref 70–99)
Potassium: 2.5 mmol/L — CL (ref 3.5–5.1)
Sodium: 136 mmol/L (ref 135–145)

## 2023-09-27 LAB — CBC
HCT: 31.7 % — ABNORMAL LOW (ref 39.0–52.0)
Hemoglobin: 11.1 g/dL — ABNORMAL LOW (ref 13.0–17.0)
MCH: 30.7 pg (ref 26.0–34.0)
MCHC: 35 g/dL (ref 30.0–36.0)
MCV: 87.6 fL (ref 80.0–100.0)
Platelets: 296 10*3/uL (ref 150–400)
RBC: 3.62 MIL/uL — ABNORMAL LOW (ref 4.22–5.81)
RDW: 13.6 % (ref 11.5–15.5)
WBC: 7.5 10*3/uL (ref 4.0–10.5)
nRBC: 0 % (ref 0.0–0.2)

## 2023-09-27 LAB — CORTISOL: Cortisol, Plasma: 7.3 ug/dL

## 2023-09-27 LAB — PSA: Prostatic Specific Antigen: <0.01 ng/mL (ref 0.00–4.00)

## 2023-09-27 LAB — PHOSPHORUS: Phosphorus: 2.6 mg/dL (ref 2.5–4.6)

## 2023-09-27 LAB — HEPARIN LEVEL (UNFRACTIONATED): Heparin Unfractionated: 0.59 [IU]/mL (ref 0.30–0.70)

## 2023-09-27 LAB — LACTIC ACID, PLASMA: Lactic Acid, Venous: 1.8 mmol/L (ref 0.5–1.9)

## 2023-09-27 LAB — CK: Total CK: 1780 U/L — ABNORMAL HIGH (ref 49–397)

## 2023-09-27 MED ORDER — POTASSIUM CHLORIDE CRYS ER 20 MEQ PO TBCR
40.0000 meq | EXTENDED_RELEASE_TABLET | Freq: Once | ORAL | Status: AC
  Administered 2023-09-27: 40 meq via ORAL
  Filled 2023-09-27: qty 2

## 2023-09-27 MED ORDER — LEVOTHYROXINE SODIUM 100 MCG/5ML IV SOLN
200.0000 ug | Freq: Once | INTRAVENOUS | Status: AC
  Administered 2023-09-27: 200 ug via INTRAVENOUS
  Filled 2023-09-27: qty 10

## 2023-09-27 MED ORDER — KCL IN DEXTROSE-NACL 20-5-0.45 MEQ/L-%-% IV SOLN
INTRAVENOUS | Status: DC
  Filled 2023-09-27 (×2): qty 1000

## 2023-09-27 MED ORDER — POTASSIUM CHLORIDE CRYS ER 20 MEQ PO TBCR: 40.0000 meq | EXTENDED_RELEASE_TABLET | ORAL | Status: DC

## 2023-09-27 MED ORDER — COSYNTROPIN 0.25 MG IJ SOLR
0.2500 mg | Freq: Once | INTRAMUSCULAR | Status: AC
  Administered 2023-09-28: 0.25 mg via INTRAVENOUS
  Filled 2023-09-27: qty 0.25

## 2023-09-27 MED ORDER — POTASSIUM CHLORIDE 10 MEQ/100ML IV SOLN
10.0000 meq | INTRAVENOUS | Status: AC
  Administered 2023-09-27 (×2): 10 meq via INTRAVENOUS
  Filled 2023-09-27 (×2): qty 100

## 2023-09-27 MED ORDER — LEVOTHYROXINE SODIUM 100 MCG PO TABS
125.0000 ug | ORAL_TABLET | Freq: Every day | ORAL | Status: DC
  Administered 2023-09-28 – 2023-10-01 (×4): 125 ug via ORAL
  Filled 2023-09-27 (×4): qty 1

## 2023-09-27 MED ORDER — POTASSIUM CHLORIDE CRYS ER 20 MEQ PO TBCR
40.0000 meq | EXTENDED_RELEASE_TABLET | ORAL | Status: AC
  Administered 2023-09-27 (×2): 40 meq via ORAL
  Filled 2023-09-27 (×2): qty 2

## 2023-09-27 MED ORDER — POTASSIUM CHLORIDE CRYS ER 20 MEQ PO TBCR: 40.0000 meq | EXTENDED_RELEASE_TABLET | Freq: Once | ORAL | Status: DC

## 2023-09-27 NOTE — Progress Notes (Signed)
PHARMACY - ANTICOAGULATION CONSULT NOTE  Pharmacy Consult for Heparin  Indication: pulmonary embolus  No Known Allergies  Patient Measurements: Height: 5\' 7"  (170.2 cm) Weight: 102.3 kg (225 lb 9.6 oz) IBW/kg (Calculated) : 66.1 Heparin Dosing Weight: 88.5   Vital Signs: Temp: 98.6 F (37 C) (11/11 0737) Temp Source: Oral (11/11 0737) BP: 139/70 (11/11 0737) Pulse Rate: 54 (11/11 0737)  Labs: Recent Labs    09/25/23 1738 09/25/23 2207 09/25/23 2207 09/26/23 0050 09/26/23 0319 09/26/23 0320 09/26/23 0910 09/26/23 1300 09/26/23 1640 09/26/23 2043 09/27/23 0719  HGB 13.0  --   --   --   --   --   --   --   --   --   --   HCT 37.0*  --   --   --   --   --   --   --   --   --   --   PLT 330  --   --   --   --   --   --   --   --   --   --   APTT  --  30  --   --   --   --   --   --   --   --   --   LABPROT  --  13.7  --   --   --   --   --   --   --   --   --   INR  --  1.0  --   --   --   --   --   --   --   --   --   HEPARINUNFRC  --   --   --   --   --   --  0.36  --  0.57  --  0.59  CREATININE 1.22  --   --   --   --  1.12  --   --   --  1.14  --   CKTOTAL  --  1,431*  --   --  1,151*  --   --   --   --   --   --   TROPONINIHS  --  20*   < > 22*  --  22*  --  16  --   --   --    < > = values in this interval not displayed.    Estimated Creatinine Clearance: 61.9 mL/min (by C-G formula based on SCr of 1.14 mg/dL).   Medical History: Past Medical History:  Diagnosis Date   Abdominal pain    RUQ   Arthritis    right ankle   ED (erectile dysfunction)    HLD (hyperlipidemia)    Hypertension    Hypothyroid    Prostate cancer (HCC)    Vertigo    1 episode only, approx 1 month ago    Assessment: Patient admitted with acute PE. PMH includes HTN, hypothyroidism, DM, hx of prostate cancer. obesity. Pharmacy consulted to dose heparin for acute PE.  No prior anticoag noted. CrCl = 57.8 ml/min    Date Time Results Comments 11/10 0910 HL 0.36 therapeutic  x1 11/10 1640 HL 0.57 therapeutic x2 11/11 0719 HL 0.59 Therapeutic at 1200 units/hr   Goal of Therapy:  Heparin level 0.3-0.7 units/ml Monitor platelets by anticoagulation protocol: Yes   Plan:  Continue heparin infusion at 1200 units/hr Next heparin level tomorrow AM Continue to monitor  H&H and platelets  Amaryah Mallen Rodriguez-Guzman PharmD, BCPS 09/27/2023 8:01 AM

## 2023-09-27 NOTE — Progress Notes (Signed)
*  PRELIMINARY RESULTS* Echocardiogram 2D Echocardiogram has been performed.  Daniel Kirby 09/27/2023, 3:12 PM

## 2023-09-27 NOTE — Evaluation (Signed)
Physical Therapy Evaluation Patient Details Name: Daniel Kirby MRN: [id number] DOB: 12/24/1945 Today's Date: 09/27/2023  History of Present Illness  Pt is 77 y/o admitted 09/25/23 for Acute Pulmonary Embolism. Pt has been experiencing low potassium levels. PmHx includes: Essential HTN, DVT, generalized weakness, and syncope/collapse episodes.   Clinical Impression  Pt received in bed on RA with friends/family at bedside and agreed to PT session. Pt performed bed mobility MinA for trunk and LE management, VC and tactile cues necessary for technique with efficient bed mobility. Pt performed STS with the use of RW (2wheels) MinA, completed 5x standing alt marches CGA, and took lateral steps towards the [facility] CGA prior to ending session back in bed. Pt refused further mobility. Pt's vitals while seated EOB reached 84% Spo2 on RA, 3L of O2 [place] was applied for the rest of session. Vitals with activity remained appropriate with the lowest reading being 98% on 3L. With maintaining vitals, pt ended session in bed on 2L of O2 [place] 97% Spo2. Pt tolerated Tx fair and will continue to benefit from skilled PT sessions to improve functional mobility and activity tolerance to maximize safety and IND following D/C.      If plan is discharge home, recommend the following: A little help with walking and/or transfers;A little help with bathing/dressing/bathroom;Assist for transportation;Help with stairs or ramp for entrance   Can travel by private vehicle   No    Equipment Recommendations Rolling walker (2 wheels)  Recommendations for Other Services       Functional Status Assessment Patient has had a recent decline in their functional status and demonstrates the ability to make significant improvements in function in a reasonable and predictable amount of time.     Precautions / Restrictions Precautions Precautions: Fall Restrictions Weight Bearing Restrictions: No      Mobility  Bed Mobility Overal bed  mobility: Needs Assistance Bed Mobility: Supine to Sit, Sit to Supine     Supine to sit: Min assist Sit to supine: Min assist   General bed mobility comments: Pt required MinA for bed mobility to assist with trunk and LE management    Transfers Overall transfer level: Needs assistance Equipment used: Rolling walker (2 wheels) Transfers: Sit to/from Stand Sit to Stand: Min assist           General transfer comment: Pt required MinA to perform STS with the use of RW (2wheels). Pt did not report any s/sx relative to dizziness.    Ambulation/Gait Ambulation/Gait assistance: Contact guard assist Gait Distance (Feet): 3 Feet Assistive device: Rolling walker (2 wheels) Gait Pattern/deviations: Step-to pattern Gait velocity: decreased     General Gait Details: Pt performed lateral steps towards HOB CGA. VC necessary to assist with RW management.  Stairs            Wheelchair Mobility     Tilt Bed    Modified Rankin (Stroke Patients Only)       Balance Overall balance assessment: Needs assistance Sitting-balance support: Feet supported, Single extremity supported Sitting balance-Leahy Scale: Good     Standing balance support: Bilateral upper extremity supported, During functional activity Standing balance-Leahy Scale: Fair                               Pertinent Vitals/Pain Pain Assessment Pain Assessment: Faces Faces Pain Scale: Hurts a little bit Pain Location: Left leg Pain Descriptors / Indicators: Constant Pain Intervention(s): Monitored during session  Home Living Family/patient expects to be discharged to:: Private residence Living Arrangements: Alone Available Help at Discharge: Family;Friend(s);Available PRN/intermittently Type of Home: House Home Access: Stairs to enter Entrance Stairs-Rails: None Entrance Stairs-Number of Steps: 4   Home Layout: One level Home Equipment: Cane - single point;Tub bench;Grab bars -  tub/shower      Prior Function Prior Level of Function : Independent/Modified Independent             Mobility Comments: Pt reports using SPC for mobility. ADLs Comments: Pt reports IND prior to admission     Extremity/Trunk Assessment   Upper Extremity Assessment Upper Extremity Assessment: Overall WFL for tasks assessed    Lower Extremity Assessment Lower Extremity Assessment: LLE deficits/detail LLE Deficits / Details: Thrombus located in LLE       Communication   Communication Communication: No apparent difficulties Cueing Techniques: Verbal cues;Tactile cues  Cognition Arousal: Alert Behavior During Therapy: WFL for tasks assessed/performed Overall Cognitive Status: Within Functional Limits for tasks assessed                                 General Comments: AOx4. Pt pleasant and willing to participate in PT session.        General Comments      Exercises Other Exercises Other Exercises: x5 standing alt marches. Pt did not report any pain or s/sx relative to dizziness. Vitals remained appropriate while on 3.5L of O2 [place]   Assessment/Plan    PT Assessment Patient needs continued PT services  PT Problem List Decreased activity tolerance;Decreased mobility;Pain       PT Treatment Interventions DME instruction;Functional mobility training    PT Goals (Current goals can be found in the Care Plan section)  Acute Rehab PT Goals Patient Stated Goal: To get better PT Goal Formulation: With patient Time For Goal Achievement: 10/11/23 Potential to Achieve Goals: Good    Frequency Min 1X/week     Co-evaluation               AM-PAC PT "6 Clicks" Mobility  Outcome Measure Help needed turning from your back to your side while in a flat bed without using bedrails?: A Little Help needed moving from lying on your back to sitting on the side of a flat bed without using bedrails?: A Little Help needed moving to and from a bed to a chair  (including a wheelchair)?: A Little Help needed standing up from a chair using your arms (e.g., wheelchair or bedside chair)?: A Little Help needed to walk in hospital room?: A Lot Help needed climbing 3-5 steps with a railing? : Total 6 Click Score: 15    End of Session Equipment Utilized During Treatment: Gait belt Activity Tolerance: Patient tolerated treatment well;Patient limited by fatigue Patient left: in bed;with call bell/phone within reach;with bed alarm set;with family/visitor present Nurse Communication: Mobility status PT Visit Diagnosis: Unsteadiness on feet (R26.81);Other abnormalities of gait and mobility (R26.89);Muscle weakness (generalized) (M62.81);Difficulty in walking, not elsewhere classified (R26.2);Pain Pain - Right/Left: Left Pain - part of body: Leg    Time: 1610-9604 PT Time Calculation (min) (ACUTE ONLY): 34 min   Charges:   PT Evaluation $PT Eval Moderate Complexity: 1 Mod PT Treatments $Therapeutic Exercise: 8-22 mins PT General Charges $$ ACUTE PT VISIT: 1 Visit       Shonna Deiter Sau[place] Howard SPT, LAT, ATC  Jeanice Dempsey Sauvignon-Howard 09/27/2023, 5:17 PM

## 2023-09-27 NOTE — Consult Note (Addendum)
Cardiology Consultation   Patient ID: Daniel Kirby MRN: [id number]; DOB: November 17, 1945  Admit date: 09/25/2023 Date of Consult: 09/27/2023  PCP:  Daniel Limerick, MD   [facility] Providers Cardiologist:  None   { new   Patient Profile:   Daniel Kirby is a 77 y.o. male with a hx of diabetes mellitus, essential hypertension and hypothyroidism who is being seen 09/27/2023 for the evaluation of ventricular tachycardia and PVCs at the request of Dr. Allena Katz.  History of Present Illness:   [name] is a 77 year old male with no prior cardiac history.  He has past medical history of diabetes mellitus, essential hypertension and hypothyroidism.  He reports that he ran out of his thyroid medication few months ago.  He presented with episodes of dizziness and syncope mostly in the setting of orthostatic dizziness and changing position from sitting to standing.  He denies chest pain.  He had multiple syncopal episodes.  His vital signs were stable on presentation.  His labs were remarkable for severe hypokalemia with a potassium of 2.0 with severely elevated TSH.  He had CT chest which showed evidence of bilateral pulmonary embolism and he was started on heparin.  He was noted to have frequent PVCs on telemetry and then had a run of polymorphic ventricular tachycardia around noontime yesterday which was associated with presyncope.  He also had another similar episode around 7:50 PM last night.  He was also symptomatic.   Past Medical History:  Diagnosis Date   Abdominal pain    RUQ   Arthritis    right ankle   ED (erectile dysfunction)    HLD (hyperlipidemia)    Hypertension    Hypothyroid    Prostate cancer (HCC)    Vertigo    1 episode only, approx 1 month ago    Past Surgical History:  Procedure Laterality Date   CATARACT EXTRACTION W/PHACO Left 06/04/2021   Procedure: CATARACT EXTRACTION PHACO AND INTRAOCULAR LENS PLACEMENT (IOC) LEFT DIABETIC 5.51 01:10.7;  Surgeon:  Lockie Mola, MD;  Location: [facility];  Service: Ophthalmology;  Laterality: Left;   COLONOSCOPY  2006   normal   COLONOSCOPY WITH PROPOFOL N/A 09/06/2015   Procedure: COLONOSCOPY WITH PROPOFOL;  Surgeon: Midge Minium, MD;  Location: [facility];  Service: Endoscopy;  Laterality: N/A;   POLYPECTOMY  09/06/2015   Procedure: POLYPECTOMY;  Surgeon: Midge Minium, MD;  Location: [facility];  Service: Endoscopy;;   PROSTATE SURGERY     robotic prostatectomy  2006     Home Medications:  Prior to Admission medications   Medication Sig Start Date End Date Taking? Authorizing Provider  glimepiride (AMARYL) 2 MG tablet One a day 05/14/23  Yes Daniel Limerick, MD  levothyroxine (SYNTHROID) 125 MCG tablet Take 1 tablet by mouth once daily 08/20/23  Yes Daniel Limerick, MD  losartan-hydrochlorothiazide (HYZAAR) 100-25 MG tablet Take 1 tablet by mouth daily. 12/10/22  Yes Daniel Limerick, MD  metFORMIN (GLUCOPHAGE) 500 MG tablet Take 1 tablet (500 mg total) by mouth 2 (two) times daily with a meal. 05/14/23  Yes Daniel Limerick, MD  mirabegron ER (MYRBETRIQ) 50 MG TB24 tablet Take 1 tablet (50 mg total) by mouth daily. 08/16/23  Yes McGowan, Carollee Herter A, PA-C  potassium chloride (KLOR-CON M) 10 MEQ tablet Take 1 tablet (10 mEq total) by mouth daily. 01/19/23  Yes Daniel Limerick, MD  pravastatin (PRAVACHOL) 10 MG tablet Take 1 tablet (10 mg total) by mouth  daily. 12/10/22  Yes Daniel Limerick, MD  NONFORMULARY OR COMPOUNDED ITEM Trimix (30/1/10)-(Pap/Phent/PGE)  Dosage: Inject 0.5 cc per injection   Vial 1ml  Qty #10 Refills 6  [facility] [phone] Fax [phone] 08/16/23   Harle Battiest, PA-C    Inpatient Medications: Scheduled Meds:  insulin aspart  0-6 Units Subcutaneous TID WC   levothyroxine  125 mcg Oral Q0600   potassium chloride  40 mEq Oral Q1H   pravastatin  10 mg Oral QHS   sodium chloride flush  3 mL Intravenous Q12H   Continuous  Infusions:  heparin 1,200 Units/hr (09/27/23 0400)   potassium chloride     PRN Meds: hydrALAZINE  Allergies:   No Known Allergies  Social History:   Social History   Socioeconomic History   Marital status: Legally Separated    Spouse name: Not on file   Number of children: 3   Years of education: Not on file   Highest education level: 12th grade  Occupational History   Occupation: Retired  Tobacco Use   Smoking status: Former    Current packs/day: 1.00    Average packs/day: 1 pack/day for 16.0 years (16.0 ttl pk-yrs)    Types: Cigars, Cigarettes   Smokeless tobacco: Current    Types: Snuff   Tobacco comments:    Quit 2007. Smoking cessation materials not required  Vaping Use   Vaping status: Never Used  Substance and Sexual Activity   Alcohol use: Not Currently    Alcohol/week: 0.0 standard drinks of alcohol    Comment: occasional, 1-2 drinks per year   Drug use: No   Sexual activity: Yes  Other Topics Concern   Not on file  Social History Narrative   Lives alone.    Social Determinants of Health   Financial Resource Strain: Low Risk  (11/23/2022)   Overall Financial Resource Strain (CARDIA)    Difficulty of Paying Living Expenses: Not hard at all  Food Insecurity: Food Insecurity Present (11/23/2022)   Hunger Vital Sign    Worried About Running Out of Food in the Last Year: Often true    Ran Out of Food in the Last Year: Often true  Transportation Needs: No Transportation Needs (11/23/2022)   PRAPARE - Administrator, Civil Service (Medical): No    Lack of Transportation (Non-Medical): No  Physical Activity: Inactive (11/23/2022)   Exercise Vital Sign    Days of Exercise per Week: 0 days    Minutes of Exercise per Session: 0 min  Stress: No Stress Concern Present (11/23/2022)   [employer] of Occupational Health - Occupational Stress Questionnaire    Feeling of Stress : Not at all  Social Connections: Socially Isolated (11/23/2022)   Social  Connection and Isolation Panel [NHANES]    Frequency of Communication with Friends and Family: More than three times a week    Frequency of Social Gatherings with Friends and Family: Once a week    Attends Religious Services: Never    Database administrator or Organizations: No    Attends Banker Meetings: Never    Marital Status: Separated  Intimate Partner Violence: Not At Risk (11/23/2022)   Humiliation, Afraid, Rape, and Kick questionnaire    Fear of Current or Ex-Partner: No    Emotionally Abused: No    Physically Abused: No    Sexually Abused: No    Family History:    Family History  Problem Relation Age of Onset   Heart  attack Father    Diabetes Mother    Kidney disease Brother      ROS:  Please see the history of present illness.   All other ROS reviewed and negative.     Physical Exam/Data:   Vitals:   09/26/23 2005 09/27/23 0000 09/27/23 0500 09/27/23 0737  BP: (!) 164/65   139/70  Pulse:    (!) 54  Resp:    18  Temp: 98.1 F (36.7 C) 98.2 F (36.8 C) 98.2 F (36.8 C) 98.6 F (37 C)  TempSrc: Oral Oral Oral Oral  SpO2:    97%  Weight:      Height:        Intake/Output Summary (Last 24 hours) at 09/27/2023 0932 Last data filed at 09/27/2023 0456 Gross per 24 hour  Intake 1248.02 ml  Output 1400 ml  Net -151.98 ml      09/26/2023   12:41 AM 08/16/2023    2:41 PM 05/14/2023    3:06 PM  Last 3 Weights  Weight (lbs) 225 lb 9.6 oz 197 lb 215 lb  Weight (kg) 102.331 kg 89.359 kg 97.523 kg     Body mass index is 35.33 kg/m.  General:  Well nourished, well developed, in no acute distress HEENT: normal Neck: no JVD Vascular: No carotid bruits; Distal pulses 2+ bilaterally Cardiac:  normal S1, S2; RRR; no murmur  Lungs:  clear to auscultation bilaterally, no wheezing, rhonchi or rales  Abd: soft, nontender, no hepatomegaly  Ext: no edema Musculoskeletal:  No deformities, BUE and BLE strength normal and equal Skin: warm and dry  Neuro:   CNs 2-12 intact, no focal abnormalities noted Psych:  Normal affect   EKG:  The EKG was personally reviewed and demonstrates: Sinus bradycardia with possible old inferior infarct.  Prolonged QTc with possible U waves. Telemetry:  Telemetry was personally reviewed and demonstrates: Frequent PVCs and occasional ventricular bigeminy.  Intermittent sinus bradycardia.  2 episodes of polymorphic ventricular tachycardia lasting about 10 seconds.  Relevant CV Studies: Echocardiogram is pending.  Laboratory Data:  High Sensitivity Troponin:   Recent Labs  Lab 09/25/23 2207 09/26/23 0050 09/26/23 0320 09/26/23 1300  TROPONINIHS 20* 22* 22* 16     Chemistry Recent Labs  Lab 09/26/23 0320 09/26/23 1300 09/26/23 2043 09/27/23 0719  NA 136  --  135 136  K 2.6* 3.5 2.6* 2.5*  CL 99  --  96* 97*  CO2 29  --  29 30  GLUCOSE 145*  --  134* 117*  BUN 12  --  9 10  CREATININE 1.12  --  1.14 0.94  CALCIUM 7.9*  --  8.1* 8.0*  MG 1.7  --  2.1 2.0  GFRNONAA >60  --  >60 >60  ANIONGAP 8  --  10 9    Recent Labs  Lab 09/25/23 2207 09/26/23 0320  PROT 6.8 6.4*  ALBUMIN 3.5 3.3*  AST 36 26  ALT 28 22  ALKPHOS 51 47  BILITOT 0.7 0.9   Lipids  Recent Labs  Lab 09/27/23 0719  CHOL 198  TRIG 131  HDL 42  LDLCALC 130*  CHOLHDL 4.7    Hematology Recent Labs  Lab 09/25/23 1738 09/27/23 0719  WBC 9.8 7.5  RBC 4.24 3.62*  HGB 13.0 11.1*  HCT 37.0* 31.7*  MCV 87.3 87.6  MCH 30.7 30.7  MCHC 35.1 35.0  RDW 14.1 13.6  PLT 330 296   Thyroid  Recent Labs  Lab 09/25/23 2207  TSH  35.582*  FREET4 <0.25*    BNPNo results for input(s): "BNP", "PROBNP" in the last 168 hours.  DDimer  Recent Labs  Lab 09/25/23 2207  DDIMER 9.18*     Radiology/Studies:  US Venous Img Lower Bilateral (DVT)  Result Date: 09/26/2023 CLINICAL DATA:  Pulmonary embolus. EXAM: BILATERAL LOWER EXTREMITY VENOUS DOPPLER ULTRASOUND TECHNIQUE: Gray-scale sonography with graded compression, as well as  color Doppler and duplex ultrasound were performed to evaluate the lower extremity deep venous systems from the level of the common femoral vein and including the common femoral, femoral, profunda femoral, popliteal and calf veins including the posterior tibial, peroneal and gastrocnemius veins when visible. The superficial great saphenous vein was also interrogated. Spectral Doppler was utilized to evaluate flow at rest and with distal augmentation maneuvers in the common femoral, femoral and popliteal veins. COMPARISON:  None Available. FINDINGS: RIGHT LOWER EXTREMITY Common Femoral Vein: No evidence of thrombus. Normal compressibility, respiratory phasicity and response to augmentation. Saphenofemoral Junction: No evidence of thrombus. Normal compressibility and flow on color Doppler imaging. Profunda Femoral Vein: No evidence of thrombus. Normal compressibility and flow on color Doppler imaging. Femoral Vein: No evidence of thrombus. Normal compressibility, respiratory phasicity and response to augmentation. Popliteal Vein: No evidence of thrombus. Normal compressibility, respiratory phasicity and response to augmentation. Calf Veins: No evidence for thrombus in the posterior tibial vein. Peroneal vein not well visualized. Other Findings:  None. LEFT LOWER EXTREMITY Common Femoral Vein: No evidence of thrombus. Normal compressibility, respiratory phasicity and response to augmentation. Saphenofemoral Junction: No evidence of thrombus. Normal compressibility and flow on color Doppler imaging. Profunda Femoral Vein: No evidence of thrombus. Normal compressibility and flow on color Doppler imaging. Femoral Vein: No evidence of thrombus. Normal compressibility, respiratory phasicity and response to augmentation. Popliteal Vein: Occlusive thrombus identified in the inferior popliteal vein. Calf Veins: No evidence of thrombus. Normal compressibility and flow on color Doppler imaging. Other Findings:  None. IMPRESSION:  1. Occlusive thrombus in the inferior left popliteal vein in this patient with known DVT by CTA chest yesterday. 2. No evidence for DVT in the right lower extremity. Electronically Signed   By: Kennith Center M.D.   On: 09/26/2023 13:12   CT Angio Chest PE W and/or Wo Contrast  Result Date: 09/26/2023 CLINICAL DATA:  Elevated D-dimer.  Dizziness.  Syncope. EXAM: CT ANGIOGRAPHY CHEST WITH CONTRAST TECHNIQUE: Multidetector CT imaging of the chest was performed using the standard protocol during bolus administration of intravenous contrast. Multiplanar CT image reconstructions and MIPs were obtained to evaluate the vascular anatomy. RADIATION DOSE REDUCTION: This exam was performed according to the departmental dose-optimization program which includes automated exposure control, adjustment of the mA and/or kV according to patient size and/or use of iterative reconstruction technique. CONTRAST:  OMNIPAQUE IOHEXOL 350 MG/ML SOLN COMPARISON:  Chest radiograph earlier today FINDINGS: Cardiovascular: Lobar and segmental pulmonary emboli in the right upper and lower lobe pulmonary arteries. Segmental pulmonary emboli in the lingular and left lower lobe pulmonary arteries. No evidence of right heart strain. No pericardial effusion. Mediastinum/Nodes: Trachea and esophagus are unremarkable. No thoracic adenopathy. Lungs/Pleura: Expiratory phase exam. Bronchial wall thickening and mucous plugging. Bibasilar atelectasis. No pleural effusion or pneumothorax. Upper Abdomen: No acute abnormality. 1.5 cm left adrenal nodule with Hounsfield units of 29. Musculoskeletal: No acute fracture. Review of the MIP images confirms the above findings. IMPRESSION: 1. Bilateral lobar and segmental pulmonary emboli. No evidence of right heart strain. 2. Bronchial wall thickening and mucous plugging. 3. 1.5  cm left adrenal nodule. This is indeterminate but likely represents a benign adenoma. Follow-up in 12 months with adrenal protocol  CT is recommended. Critical Value/emergent results were called by telephone at the time of interpretation on 09/26/2023 at 12:19 am to provider Manuela Schwartz, NP, who verbally acknowledged these results. Electronically Signed   By: Minerva Fester M.D.   On: 09/26/2023 00:19   MR BRAIN WO CONTRAST  Result Date: 09/25/2023 CLINICAL DATA:  Initial evaluation for syncope/presyncope, dizziness. EXAM: MRI HEAD WITHOUT CONTRAST TECHNIQUE: Multiplanar, multiecho pulse sequences of the brain and surrounding structures were obtained without intravenous contrast. COMPARISON:  Prior CT from earlier the same day. FINDINGS: Brain: Mild age-related cerebral atrophy with chronic small vessel ischemic disease. Small remote infarcts involving the right frontal lobe and right cerebellum. No evidence for acute or subacute ischemia. No acute intracranial hemorrhage. Few punctate chronic micro hemorrhages noted, likely hypertensive/small vessel related. No mass lesion, midline shift or mass effect. No hydrocephalus or extra-axial fluid collection. Pituitary gland within normal limits. Vascular: Major intracranial vascular flow voids are maintained. Skull and upper cervical spine: Craniocervical junction within normal limits. Bone marrow signal intensity within normal limits. No scalp soft tissue abnormality. Chronic mucosal thickening present about the ethmoidal air cells and left greater than right maxillary sinuses. No mastoid effusion. Other: None. IMPRESSION: 1. No acute intracranial abnormality. 2. Mild age-related cerebral atrophy with chronic small vessel ischemic disease, with small remote infarcts involving the right frontal lobe and right cerebellum. Electronically Signed   By: Rise Mu M.D.   On: 09/25/2023 23:40   DG Chest Port 1 View  Result Date: 09/25/2023 CLINICAL DATA:  Syncope EXAM: PORTABLE CHEST 1 VIEW COMPARISON:  Chest x-ray 12/19/2018 FINDINGS: The heart size and mediastinal contours are  within normal limits. Both lungs are clear. The visualized skeletal structures are unremarkable. IMPRESSION: No active disease. Electronically Signed   By: Darliss Cheney M.D.   On: 09/25/2023 22:48   CT HEAD WO CONTRAST  Result Date: 09/25/2023 CLINICAL DATA:  dizziness EXAM: CT HEAD WITHOUT CONTRAST TECHNIQUE: Contiguous axial images were obtained from the base of the skull through the vertex without intravenous contrast. RADIATION DOSE REDUCTION: This exam was performed according to the departmental dose-optimization program which includes automated exposure control, adjustment of the mA and/or kV according to patient size and/or use of iterative reconstruction technique. COMPARISON:  None Available. FINDINGS: Brain: Patchy and confluent areas of decreased attenuation are noted throughout the deep and periventricular white matter of the cerebral hemispheres bilaterally, compatible with chronic microvascular ischemic disease. Right cerebellar lacunar infarction. No evidence of large-territorial acute infarction. No parenchymal hemorrhage. No mass lesion. No extra-axial collection. No mass effect or midline shift. No hydrocephalus. Basilar cisterns are patent. Vascular: No hyperdense vessel. Atherosclerotic calcifications are present within the cavernous internal carotid and vertebral arteries. Skull: No acute fracture or focal lesion. Sinuses/Orbits: Left maxillary sinus mucosal thickening with wall hypertrophy. Otherwise paranasal sinuses and mastoid air cells are clear. Left orbital lens replacement. Otherwise the orbits are unremarkable. Other: None. IMPRESSION: 1. No acute intracranial abnormality. 2. Chronic left maxillary sinus disease. Electronically Signed   By: Tish Frederickson M.D.   On: 09/25/2023 19:34     Assessment and Plan:   Polymorphic ventricular tachycardia and frequent PVCs: This is in the setting of prolonged QT interval with possible U waves on EKG in the setting of severe hypokalemia.   In addition, he is severely hypothyroid.  Recommend electrolyte replacement and treatment of hypothyroidism.  A beta-blocker is not recommended given intermittent sinus bradycardia in the setting of hypothyroidism.  Will obtain an echocardiogram to evaluate ejection fraction and wall motion.  His EKG is suggestive of prior inferior infarct.  He might ultimately require ischemic cardiac evaluation especially if his ejection fraction is reduced but might be best to wait until his hypothyroidism is improved. Severe hypothyroidism: Consider endocrinology consultation to see if IV levothyroxine or concomitant IV steroids are needed considering his systemic manifestations with bradycardia, mild rhabdomyolysis and severe electrolyte abnormalities. Severe hypokalemia: Recommend aggressive replacement given cardiac arrhythmia. Pulmonary embolism: Currently on anticoagulation with heparin.  No evidence of RV strain on CT scan and this will be checked with echocardiogram.     For questions or updates, please contact  [facility] Please consult [email or web address] for contact info under    Signed, Lorine Bears, MD  09/27/2023 9:32 AM

## 2023-09-27 NOTE — Consult Note (Signed)
PHARMACY CONSULT NOTE - ELECTROLYTES  Pharmacy Consult for Electrolyte Monitoring and Replacement   Recent Labs: Height: 5\' 7"  (170.2 cm) Weight: 102.3 kg (225 lb 9.6 oz) IBW/kg (Calculated) : 66.1 Estimated Creatinine Clearance: 75 mL/min (by C-G formula based on SCr of 0.94 mg/dL). Potassium (mmol/L)  Date Value  09/27/2023 2.5 (LL)   Magnesium (mg/dL)  Date Value  40/98/1191 2.0   Calcium (mg/dL)  Date Value  47/82/9562 8.0 (L)   Albumin (g/dL)  Date Value  13/06/6577 3.3 (L)  01/14/2023 4.5   Phosphorus (mg/dL)  Date Value  46/96/2952 2.6   Sodium (mmol/L)  Date Value  09/27/2023 136  01/14/2023 135   Corrected Ca: 8.5 mg/dL  Assessment  Daniel Kirby is a 77 y.o. male presenting with lightheadedness and syncope. PMH significant for PE, T2DM, hypothyroidism. Pharmacy has been consulted to monitor and replace electrolytes.  Diet: Carb Modified; Thin MIVF: NA Pertinent medications: NA  Goal of Therapy: Electrolytes WNL  Plan:  K 2.5; MD already ordered Kcl po q1hr x 2 doses. Will add Kcl IV q1hr x 2 doses per protocol. Repeat K today at 1300 per MD orders Check BMP, Mg, Phos with AM labs  Thank you for allowing pharmacy to be a part of this patient's care.  Cassandria Drew Rodriguez-Guzman PharmD, BCPS 09/27/2023 8:25 AM

## 2023-09-27 NOTE — Progress Notes (Signed)
PROGRESS NOTE    Daniel Kirby  UXL:[id number] DOB: 10-29-1946 DOA: 09/25/2023 PCP: Duanne Limerick, MD  Outpatient Specialists: none    Brief Narrative:   Presented with several weeks weakness and lightneadedness and syncope with ambulation and difficulty getting up from the ground and lower extremity swelling, found to have bilateral PE   Assessment & Plan:   Principal Problem:   Acute pulmonary embolism (HCC) Active Problems:   Syncope and collapse   Essential hypertension   Adult hypothyroidism   Type 2 diabetes mellitus without complication, without long-term current use of insulin (HCC)   Generalized weakness   Dizziness   Electrolyte abnormality   Obesity (BMI 30-39.9)   DVT (deep venous thrombosis) (HCC)  # Acute pulmonary embolism # DVT B/l, no evidence heart strain, also left popliteal DVT.  Hemodynamically stable - continue IV heparin - f/u TTE, likely transition to orals after that  # Debility - PT eval   # History prostate cancer Psa wnl two months ago so doubt recurrence  # T2DM A1c in the 7s, euglycemic here - hold home orals - SSI  # Hypokalemia # Hypomagnesemia Denies vomiting/diarrhea, is on hydrochlorothiazide at home, also hypothyroid - replete today  # Hypothyroid Uncontrolled with elevated tsh and low t4, patient reports noncompliance w/ home synthroid. No obtundation, hypothermia, hyponatremia, hypotension to suggest myxedema coma, discussed w/ ICU attending today and he agrees. - resume home synthroid, repeat TFTs 4-6 wks - f/u AM cortisol  # Adrenal nodule, incidental - outpt f/u  # Rhabdomyolysis Mild, 2/2 falls at home and inability to get up, elevated ck can also be seen with dvt - trend - continue fluids - stop statin  # HTN  Here normotensive - home losartan/hdtz on hold  # History prior strokes Seen on MRI, nothing acute - f/u ldl, statin on hold 2/2 rhabdo  # Obesity noted   DVT prophylaxis: IV heparin Code  Status: full Family Communication: family members updated @ bedside today  Level of care: Progressive Status is: Inpatient Remains inpatient appropriate because: need for onging inpt w/u    Consultants:  none  Procedures: none  Antimicrobials:  none    Subjective: Reports feeling fine at rest, no  Objective: Vitals:   09/27/23 0737 09/27/23 1123 09/27/23 1134 09/27/23 1138  BP: 139/70 (!) 130/112 (!) 141/76 (!) 148/104  Pulse: (!) 54 60 71   Resp: 18 18    Temp: 98.6 F (37 C) 98 F (36.7 C)    TempSrc: Oral Oral    SpO2: 97% 100%    Weight:      Height:        Intake/Output Summary (Last 24 hours) at 09/27/2023 1336 Last data filed at 09/27/2023 1151 Gross per 24 hour  Intake 1248.02 ml  Output 2150 ml  Net -901.98 ml   Filed Weights   09/26/23 0041  Weight: 102.3 kg    Examination:  General exam: Appears calm and comfortable  Respiratory system: Clear to auscultation. Respiratory effort normal. Cardiovascular system: S1 & S2 heard, bradycardic, distant heart sounds Gastrointestinal system: Abdomen is obese, soft and nontender. No organomegaly or masses felt. Central nervous system: Alert and oriented. No focal neurological deficits. Extremities: Symmetric 5 x 5 power. Pitting edema to knees Skin: No rashes, lesions or ulcers Psychiatry: Judgement and insight appear normal. Mood & affect appropriate.     Data Reviewed: I have personally reviewed following labs and imaging studies  CBC: Recent Labs  Lab 09/25/23 1738  09/27/23 0719  WBC 9.8 7.5  HGB 13.0 11.1*  HCT 37.0* 31.7*  MCV 87.3 87.6  PLT 330 296   Basic Metabolic Panel: Recent Labs  Lab 09/25/23 1738 09/26/23 0320 09/26/23 1300 09/26/23 2043 09/27/23 0719  NA 137 136  --  135 136  K 2.0* 2.6* 3.5 2.6* 2.5*  CL 96* 99  --  96* 97*  CO2 27 29  --  29 30  GLUCOSE 277* 145*  --  134* 117*  BUN 12 12  --  9 10  CREATININE 1.22 1.12  --  1.14 0.94  CALCIUM 8.5* 7.9*  --  8.1*  8.0*  MG 1.7 1.7  --  2.1 2.0  PHOS  --   --   --   --  2.6   GFR: Estimated Creatinine Clearance: 75 mL/min (by C-G formula based on SCr of 0.94 mg/dL). Liver Function Tests: Recent Labs  Lab 09/25/23 2207 09/26/23 0320  AST 36 26  ALT 28 22  ALKPHOS 51 47  BILITOT 0.7 0.9  PROT 6.8 6.4*  ALBUMIN 3.5 3.3*   No results for input(s): "LIPASE", "AMYLASE" in the last 168 hours. No results for input(s): "AMMONIA" in the last 168 hours. Coagulation Profile: Recent Labs  Lab 09/25/23 2207  INR 1.0   Cardiac Enzymes: Recent Labs  Lab 09/25/23 2207 09/26/23 0319 09/27/23 0719  CKTOTAL 1,431* 1,151* 1,780*   BNP (last 3 results) No results for input(s): "PROBNP" in the last 8760 hours. HbA1C: Recent Labs    09/25/23 1738  HGBA1C 7.2*   CBG: Recent Labs  Lab 09/26/23 1648 09/26/23 1944 09/26/23 2207 09/27/23 0840 09/27/23 1125  GLUCAP 128* 137* 166* 117* 158*   Lipid Profile: Recent Labs    09/27/23 0719  CHOL 198  HDL 42  LDLCALC 130*  TRIG 131  CHOLHDL 4.7   Thyroid Function Tests: Recent Labs    09/25/23 2207  TSH 35.582*  FREET4 <0.25*   Anemia Panel: No results for input(s): "VITAMINB12", "FOLATE", "FERRITIN", "TIBC", "IRON", "RETICCTPCT" in the last 72 hours. Urine analysis:    Component Value Date/Time   COLORURINE YELLOW (A) 09/26/2023 0320   APPEARANCEUR CLEAR (A) 09/26/2023 0320   APPEARANCEUR Clear 01/06/2018 1503   LABSPEC 1.021 09/26/2023 0320   PHURINE 7.0 09/26/2023 0320   GLUCOSEU 50 (A) 09/26/2023 0320   HGBUR SMALL (A) 09/26/2023 0320   BILIRUBINUR NEGATIVE 09/26/2023 0320   BILIRUBINUR Negative 01/06/2018 1503   KETONESUR NEGATIVE 09/26/2023 0320   PROTEINUR NEGATIVE 09/26/2023 0320   UROBILINOGEN 0.2 11/05/2017 1609   NITRITE NEGATIVE 09/26/2023 0320   LEUKOCYTESUR NEGATIVE 09/26/2023 0320   Sepsis Labs: @LABRCNTIP (procalcitonin:4,lacticidven:4)  )No results found for this or any previous visit (from the past 240  hour(s)).       Radiology Studies: US Venous Img Lower Bilateral (DVT)  Result Date: 09/26/2023 CLINICAL DATA:  Pulmonary embolus. EXAM: BILATERAL LOWER EXTREMITY VENOUS DOPPLER ULTRASOUND TECHNIQUE: Gray-scale sonography with graded compression, as well as color Doppler and duplex ultrasound were performed to evaluate the lower extremity deep venous systems from the level of the common femoral vein and including the common femoral, femoral, profunda femoral, popliteal and calf veins including the posterior tibial, peroneal and gastrocnemius veins when visible. The superficial great saphenous vein was also interrogated. Spectral Doppler was utilized to evaluate flow at rest and with distal augmentation maneuvers in the common femoral, femoral and popliteal veins. COMPARISON:  None Available. FINDINGS: RIGHT LOWER EXTREMITY Common Femoral Vein: No evidence of  thrombus. Normal compressibility, respiratory phasicity and response to augmentation. Saphenofemoral Junction: No evidence of thrombus. Normal compressibility and flow on color Doppler imaging. Profunda Femoral Vein: No evidence of thrombus. Normal compressibility and flow on color Doppler imaging. Femoral Vein: No evidence of thrombus. Normal compressibility, respiratory phasicity and response to augmentation. Popliteal Vein: No evidence of thrombus. Normal compressibility, respiratory phasicity and response to augmentation. Calf Veins: No evidence for thrombus in the posterior tibial vein. Peroneal vein not well visualized. Other Findings:  None. LEFT LOWER EXTREMITY Common Femoral Vein: No evidence of thrombus. Normal compressibility, respiratory phasicity and response to augmentation. Saphenofemoral Junction: No evidence of thrombus. Normal compressibility and flow on color Doppler imaging. Profunda Femoral Vein: No evidence of thrombus. Normal compressibility and flow on color Doppler imaging. Femoral Vein: No evidence of thrombus. Normal  compressibility, respiratory phasicity and response to augmentation. Popliteal Vein: Occlusive thrombus identified in the inferior popliteal vein. Calf Veins: No evidence of thrombus. Normal compressibility and flow on color Doppler imaging. Other Findings:  None. IMPRESSION: 1. Occlusive thrombus in the inferior left popliteal vein in this patient with known DVT by CTA chest yesterday. 2. No evidence for DVT in the right lower extremity. Electronically Signed   By: Kennith Center M.D.   On: 09/26/2023 13:12   CT Angio Chest PE W and/or Wo Contrast  Result Date: 09/26/2023 CLINICAL DATA:  Elevated D-dimer.  Dizziness.  Syncope. EXAM: CT ANGIOGRAPHY CHEST WITH CONTRAST TECHNIQUE: Multidetector CT imaging of the chest was performed using the standard protocol during bolus administration of intravenous contrast. Multiplanar CT image reconstructions and MIPs were obtained to evaluate the vascular anatomy. RADIATION DOSE REDUCTION: This exam was performed according to the departmental dose-optimization program which includes automated exposure control, adjustment of the mA and/or kV according to patient size and/or use of iterative reconstruction technique. CONTRAST:  OMNIPAQUE IOHEXOL 350 MG/ML SOLN COMPARISON:  Chest radiograph earlier today FINDINGS: Cardiovascular: Lobar and segmental pulmonary emboli in the right upper and lower lobe pulmonary arteries. Segmental pulmonary emboli in the lingular and left lower lobe pulmonary arteries. No evidence of right heart strain. No pericardial effusion. Mediastinum/Nodes: Trachea and esophagus are unremarkable. No thoracic adenopathy. Lungs/Pleura: Expiratory phase exam. Bronchial wall thickening and mucous plugging. Bibasilar atelectasis. No pleural effusion or pneumothorax. Upper Abdomen: No acute abnormality. 1.5 cm left adrenal nodule with Hounsfield units of 29. Musculoskeletal: No acute fracture. Review of the MIP images confirms the above findings.  IMPRESSION: 1. Bilateral lobar and segmental pulmonary emboli. No evidence of right heart strain. 2. Bronchial wall thickening and mucous plugging. 3. 1.5 cm left adrenal nodule. This is indeterminate but likely represents a benign adenoma. Follow-up in 12 months with adrenal protocol CT is recommended. Critical Value/emergent results were called by telephone at the time of interpretation on 09/26/2023 at 12:19 am to provider Manuela Schwartz, NP, who verbally acknowledged these results. Electronically Signed   By: Minerva Fester M.D.   On: 09/26/2023 00:19   MR BRAIN WO CONTRAST  Result Date: 09/25/2023 CLINICAL DATA:  Initial evaluation for syncope/presyncope, dizziness. EXAM: MRI HEAD WITHOUT CONTRAST TECHNIQUE: Multiplanar, multiecho pulse sequences of the brain and surrounding structures were obtained without intravenous contrast. COMPARISON:  Prior CT from earlier the same day. FINDINGS: Brain: Mild age-related cerebral atrophy with chronic small vessel ischemic disease. Small remote infarcts involving the right frontal lobe and right cerebellum. No evidence for acute or subacute ischemia. No acute intracranial hemorrhage. Few punctate chronic micro hemorrhages noted, likely hypertensive/small vessel related.  No mass lesion, midline shift or mass effect. No hydrocephalus or extra-axial fluid collection. Pituitary gland within normal limits. Vascular: Major intracranial vascular flow voids are maintained. Skull and upper cervical spine: Craniocervical junction within normal limits. Bone marrow signal intensity within normal limits. No scalp soft tissue abnormality. Chronic mucosal thickening present about the ethmoidal air cells and left greater than right maxillary sinuses. No mastoid effusion. Other: None. IMPRESSION: 1. No acute intracranial abnormality. 2. Mild age-related cerebral atrophy with chronic small vessel ischemic disease, with small remote infarcts involving the right frontal lobe and right  cerebellum. Electronically Signed   By: Rise Mu M.D.   On: 09/25/2023 23:40   DG Chest Port 1 View  Result Date: 09/25/2023 CLINICAL DATA:  Syncope EXAM: PORTABLE CHEST 1 VIEW COMPARISON:  Chest x-ray 12/19/2018 FINDINGS: The heart size and mediastinal contours are within normal limits. Both lungs are clear. The visualized skeletal structures are unremarkable. IMPRESSION: No active disease. Electronically Signed   By: Darliss Cheney M.D.   On: 09/25/2023 22:48   CT HEAD WO CONTRAST  Result Date: 09/25/2023 CLINICAL DATA:  dizziness EXAM: CT HEAD WITHOUT CONTRAST TECHNIQUE: Contiguous axial images were obtained from the base of the skull through the vertex without intravenous contrast. RADIATION DOSE REDUCTION: This exam was performed according to the departmental dose-optimization program which includes automated exposure control, adjustment of the mA and/or kV according to patient size and/or use of iterative reconstruction technique. COMPARISON:  None Available. FINDINGS: Brain: Patchy and confluent areas of decreased attenuation are noted throughout the deep and periventricular white matter of the cerebral hemispheres bilaterally, compatible with chronic microvascular ischemic disease. Right cerebellar lacunar infarction. No evidence of large-territorial acute infarction. No parenchymal hemorrhage. No mass lesion. No extra-axial collection. No mass effect or midline shift. No hydrocephalus. Basilar cisterns are patent. Vascular: No hyperdense vessel. Atherosclerotic calcifications are present within the cavernous internal carotid and vertebral arteries. Skull: No acute fracture or focal lesion. Sinuses/Orbits: Left maxillary sinus mucosal thickening with wall hypertrophy. Otherwise paranasal sinuses and mastoid air cells are clear. Left orbital lens replacement. Otherwise the orbits are unremarkable. Other: None. IMPRESSION: 1. No acute intracranial abnormality. 2. Chronic left maxillary sinus  disease. Electronically Signed   By: Tish Frederickson M.D.   On: 09/25/2023 19:34        Scheduled Meds:  insulin aspart  0-6 Units Subcutaneous TID WC   sodium chloride flush  3 mL Intravenous Q12H   Continuous Infusions:  dextrose 5 % and 0.45 % NaCl with KCl 20 mEq/L 75 mL/hr at 09/27/23 1243   heparin 1,200 Units/hr (09/27/23 0400)     LOS: 2 days     Silvano Bilis, MD [facility]   If 7PM-7AM, please contact night-coverage [email or web address] Password [email or web address] 09/27/2023, 1:36 PM

## 2023-09-27 NOTE — Progress Notes (Signed)
Transition of Care [facility]) - Inpatient Brief Assessment   Patient Details  Name: Daniel Kirby MRN: [id number] Date of Birth: August 20, 1946  [facility] CM/SW Contact:    Darolyn Rua, LCSW Phone Number: 09/27/2023, 9:33 AM   Clinical Narrative:  Patient from home via acems for dizziness, history of hypertension, hyperlipidemia, prostate cancer. Heparin infusion initiated 09/26/23   PCP: Elizabeth Sauer, MD Insurance: [facility] Medicare  Please consult TOC should discharge planning needs arise.   Transition of Care Asessment: Insurance and Status: Insurance coverage has been reviewed Patient has primary care physician: Yes Home environment has been reviewed: from home Prior level of function:: independent Prior/Current Home Services: No current home services Social Determinants of Health Reivew: SDOH reviewed no interventions necessary Readmission risk has been reviewed: Yes Transition of care needs: no transition of care needs at this time

## 2023-09-27 NOTE — Consult Note (Signed)
PHARMACY CONSULT NOTE - ELECTROLYTES  Pharmacy Consult for Electrolyte Monitoring and Replacement   Recent Labs: Height: 5\' 7"  (170.2 cm) Weight: 102.3 kg (225 lb 9.6 oz) IBW/kg (Calculated) : 66.1 Estimated Creatinine Clearance: 75 mL/min (by C-G formula based on SCr of 0.94 mg/dL). Potassium (mmol/L)  Date Value  09/27/2023 3.1 (L)   Magnesium (mg/dL)  Date Value  13/06/6577 2.0   Calcium (mg/dL)  Date Value  46/96/2952 8.0 (L)   Albumin (g/dL)  Date Value  84/13/2440 3.3 (L)  01/14/2023 4.5   Phosphorus (mg/dL)  Date Value  09/12/2535 2.6   Sodium (mmol/L)  Date Value  09/27/2023 136  01/14/2023 135   Corrected Ca: 8.5 mg/dL  Assessment  Daniel Kirby is a 77 y.o. male presenting with lightheadedness and syncope. PMH significant for PE, T2DM, hypothyroidism. Pharmacy has been consulted to monitor and replace electrolytes.  Diet: Carb Modified; Thin MIVF: D51/2NS w/Kcl 20 mEq/L @ 75 ml/hr Pertinent medications: NA  Goal of Therapy: Electrolytes WNL  Plan:  K 2.5; MD already ordered Kcl po q1hr x 2 doses. Will add Kcl IV q1hr x 2 doses per protocol. Repeat K 3.1. Will give an additional Kcl 40 mEq x 1 per protocol. Check BMP, Mg, Phos with AM labs  Thank you for allowing pharmacy to be a part of this patient's care.  Paulita Fujita, PharmD Clinical Pharmacist 09/27/2023 4:48 PM

## 2023-09-27 NOTE — Progress Notes (Signed)
PT Cancellation Note  Patient Details Name: Daniel Kirby MRN: [id number] DOB: [birth year]   Cancelled Treatment:    Reason Eval/Treat Not Completed: Patient not medically ready. PT session on hold as pt presents with B PEs, heparin infusion initiated 09/26/23 at 0107. Will re-attempt evaluation at a later date.   Kimberli Winne Sauvignon Howard SPT,LAT, ATC  Daniel Kirby 09/27/2023, 8:57 AM

## 2023-09-27 NOTE — TOC Benefit Eligibility Note (Signed)
Patient Product/process development scientist completed.    The patient is insured through [place]. Patient has Medicare and is not eligible for a copay card, but may be able to apply for patient assistance, if available.    Ran test claim for Eliquis 5 mg and the current 30 day co-pay is $25.00.  Ran test claim for Xarelto 20 mg and the current 30 day co-pay is $25.00.  This test claim was processed through [facility]- copay amounts may vary at other pharmacies due to pharmacy/plan contracts, or as the patient moves through the different stages of their insurance plan.     Roland Earl, CPHT Pharmacy Technician III Certified Patient Advocate [facility] Patient Advocate Team Direct Number: [phone]  Fax: [phone]

## 2023-09-27 NOTE — Plan of Care (Signed)

## 2023-09-28 DIAGNOSIS — Z8679 Personal history of other diseases of the circulatory system: Secondary | ICD-10-CM

## 2023-09-28 DIAGNOSIS — I472 Ventricular tachycardia, unspecified: Secondary | ICD-10-CM | POA: Diagnosis not present

## 2023-09-28 DIAGNOSIS — I2699 Other pulmonary embolism without acute cor pulmonale: Secondary | ICD-10-CM | POA: Diagnosis not present

## 2023-09-28 LAB — BASIC METABOLIC PANEL
Anion gap: 6 (ref 5–15)
BUN: 8 mg/dL (ref 8–23)
CO2: 27 mmol/L (ref 22–32)
Calcium: 7.6 mg/dL — ABNORMAL LOW (ref 8.9–10.3)
Chloride: 102 mmol/L (ref 98–111)
Creatinine, Ser: 0.98 mg/dL (ref 0.61–1.24)
GFR, Estimated: >60 mL/min (ref >60)
Glucose, Bld: 148 mg/dL — ABNORMAL HIGH (ref 70–99)
Potassium: 3 mmol/L — ABNORMAL LOW (ref 3.5–5.1)
Sodium: 135 mmol/L (ref 135–145)

## 2023-09-28 LAB — CBC
HCT: 32.2 % — ABNORMAL LOW (ref 39.0–52.0)
Hemoglobin: 11.2 g/dL — ABNORMAL LOW (ref 13.0–17.0)
MCH: 30.9 pg (ref 26.0–34.0)
MCHC: 34.8 g/dL (ref 30.0–36.0)
MCV: 88.7 fL (ref 80.0–100.0)
Platelets: 276 10*3/uL (ref 150–400)
RBC: 3.63 MIL/uL — ABNORMAL LOW (ref 4.22–5.81)
RDW: 13.2 % (ref 11.5–15.5)
WBC: 6.7 10*3/uL (ref 4.0–10.5)
nRBC: 0 % (ref 0.0–0.2)

## 2023-09-28 LAB — GLUCOSE, CAPILLARY
Glucose-Capillary: 155 mg/dL — ABNORMAL HIGH (ref 70–99)
Glucose-Capillary: 178 mg/dL — ABNORMAL HIGH (ref 70–99)
Glucose-Capillary: 138 mg/dL — ABNORMAL HIGH (ref 70–99)
Glucose-Capillary: 132 mg/dL — ABNORMAL HIGH (ref 70–99)

## 2023-09-28 LAB — HIGH SENSITIVITY CRP: CRP, High Sensitivity: 6.95 mg/L — ABNORMAL HIGH (ref 0.00–3.00)

## 2023-09-28 LAB — ACTH STIMULATION, 3 TIME POINTS
Cortisol, 30 Min: 13.2 ug/dL
Cortisol, 60 Min: 16.2 ug/dL
Cortisol, Base: 4.5 ug/dL

## 2023-09-28 LAB — CK: Total CK: 2004 U/L — ABNORMAL HIGH (ref 49–397)

## 2023-09-28 LAB — MAGNESIUM: Magnesium: 2 mg/dL (ref 1.7–2.4)

## 2023-09-28 LAB — HEPARIN LEVEL (UNFRACTIONATED): Heparin Unfractionated: 0.66 [IU]/mL (ref 0.30–0.70)

## 2023-09-28 MED ORDER — POTASSIUM CHLORIDE 10 MEQ/100ML IV SOLN
10.0000 meq | INTRAVENOUS | Status: AC
  Administered 2023-09-28 (×2): 10 meq via INTRAVENOUS
  Filled 2023-09-28 (×2): qty 100

## 2023-09-28 MED ORDER — SPIRONOLACTONE 25 MG PO TABS
25.0000 mg | ORAL_TABLET | Freq: Every day | ORAL | Status: DC
  Administered 2023-09-28 – 2023-10-01 (×4): 25 mg via ORAL
  Filled 2023-09-28 (×5): qty 1

## 2023-09-28 MED ORDER — APIXABAN 5 MG PO TABS
10.0000 mg | ORAL_TABLET | Freq: Two times a day (BID) | ORAL | Status: DC
  Administered 2023-09-28 – 2023-10-01 (×7): 10 mg via ORAL
  Filled 2023-09-28 (×7): qty 2

## 2023-09-28 MED ORDER — POTASSIUM CHLORIDE CRYS ER 20 MEQ PO TBCR: 40.0000 meq | EXTENDED_RELEASE_TABLET | Freq: Once | ORAL | Status: DC

## 2023-09-28 MED ORDER — APIXABAN 5 MG PO TABS: 5.0000 mg | ORAL_TABLET | Freq: Two times a day (BID) | ORAL | Status: DC

## 2023-09-28 MED ORDER — POTASSIUM CHLORIDE CRYS ER 20 MEQ PO TBCR
40.0000 meq | EXTENDED_RELEASE_TABLET | Freq: Two times a day (BID) | ORAL | Status: AC
  Administered 2023-09-28 (×2): 40 meq via ORAL
  Filled 2023-09-28 (×2): qty 2

## 2023-09-28 NOTE — [place] FL2 (Signed)
[place] MEDICAID FL2 LEVEL OF CARE FORM     IDENTIFICATION  Patient Name: Daniel Kirby Birthdate: [birth year] Sex: male Admission Date (Current Location): 09/25/2023  [facility] and [place] Number:  Chiropodist and Address:  [facility], [address], [place], [place] [address]      Provider Number: [id number]  Attending Physician Name and Address:  Kathrynn Running, MD  Relative Name and Phone Number:  Liborio Nixon (sister)  [phone]    Current Level of Care: Hospital Recommended Level of Care: Skilled Nursing Facility Prior Approval Number:    Date Approved/Denied:   PASRR Number:    Discharge Plan: SNF    Current Diagnoses: Patient Active Problem List   Diagnosis Date Noted   Electrolyte abnormality 09/26/2023   Acute pulmonary embolism (HCC) 09/26/2023   Obesity (BMI 30-39.9) 09/26/2023   DVT (deep venous thrombosis) (HCC) 09/26/2023   Generalized weakness 09/25/2023   Dizziness 09/25/2023   Syncope and collapse 09/25/2023   Type 2 diabetes mellitus without complication, without long-term current use of insulin (HCC) 01/18/2018   Chronic low back pain without sciatica 06/08/2017   Essential hypertension 12/08/2016   Adult hypothyroidism 12/08/2016   Arthritis 12/08/2016   Acute anxiety 12/08/2016   Cervical radiculopathy 12/08/2016   Mixed hyperlipidemia 12/08/2016   Special screening for malignant neoplasms, colon    Benign neoplasm of ascending colon    Major depressive disorder with single episode, in partial remission (HCC) 08/20/2015   Erectile dysfunction 06/04/2015   History of prostate cancer 06/04/2015    Orientation RESPIRATION BLADDER Height & Weight     Self, Time, Situation, Place  Normal Continent Weight: 225 lb 9.6 oz (102.3 kg) Height:  5\' 7"  (170.2 cm)  BEHAVIORAL SYMPTOMS/MOOD NEUROLOGICAL BOWEL NUTRITION STATUS      Continent Diet (see discharge summary)  AMBULATORY STATUS COMMUNICATION OF NEEDS  Skin   Limited Assist Verbally Normal                       Personal Care Assistance Level of Assistance  Bathing, Feeding, Dressing, Total care Bathing Assistance: Limited assistance Feeding assistance: Independent Dressing Assistance: Limited assistance Total Care Assistance: Limited assistance   Functional Limitations Info  Hearing, Speech, Sight Sight Info: Adequate Hearing Info: Adequate Speech Info: Adequate    SPECIAL CARE FACTORS FREQUENCY  PT (By licensed PT), OT (By licensed OT)     PT Frequency: min 4x weekly OT Frequency: min 4x weekly            Contractures Contractures Info: Not present    Additional Factors Info  Code Status, Allergies Code Status Info: full Allergies Info: NKA           Current Medications (09/28/2023):  This is the current hospital active medication list Current Facility-Administered Medications  Medication Dose Route Frequency Provider Last Rate Last Admin   hydrALAZINE (APRESOLINE) injection 10 mg  10 mg Intravenous Q6H PRN Gertha Calkin, MD       insulin aspart (novoLOG) injection 0-6 Units  0-6 Units Subcutaneous TID WC Gertha Calkin, MD   1 Units at 09/28/23 0924   levothyroxine (SYNTHROID) tablet 125 mcg  125 mcg Oral Q0600 Kathrynn Running, MD   125 mcg at 09/28/23 0540   potassium chloride 10 mEq in 100 mL IVPB  10 mEq Intravenous Q1 Hr x 2 Kathrynn Running, MD 100 mL/hr at 09/28/23 1026 10 mEq at 09/28/23 1026   potassium  chloride SA (KLOR-CON M) CR tablet 40 mEq  40 mEq Oral BID Kathrynn Running, MD   40 mEq at 09/28/23 5284   sodium chloride flush (NS) 0.9 % injection 3 mL  3 mL Intravenous Q12H Irena Cords V, MD   3 mL at 09/27/23 2233   spironolactone (ALDACTONE) tablet 25 mg  25 mg Oral Daily Eula Listen M, PA-C   25 mg at 09/28/23 1324     Discharge Medications: Please see discharge summary for a list of discharge medications.  Relevant Imaging Results:  Relevant Lab Results:   Additional  Information SSN: [id number]  Darolyn Rua, LCSW

## 2023-09-28 NOTE — Progress Notes (Signed)
PROGRESS NOTE    Daniel Kirby  ZOX:[id number] DOB: 08-Jun-1946 DOA: 09/25/2023 PCP: Duanne Limerick, MD  Outpatient Specialists: none    Brief Narrative:   Presented with several weeks weakness and lightneadedness and syncope with ambulation and difficulty getting up from the ground and lower extremity swelling, found to have bilateral PE   Assessment & Plan:   Principal Problem:   Acute pulmonary embolism (HCC) Active Problems:   Syncope and collapse   Essential hypertension   Adult hypothyroidism   Type 2 diabetes mellitus without complication, without long-term current use of insulin (HCC)   Generalized weakness   Dizziness   Electrolyte abnormality   Obesity (BMI 30-39.9)   DVT (deep venous thrombosis) (HCC)  # Acute pulmonary embolism # DVT B/l, no evidence heart strain, also left popliteal DVT.  Hemodynamically stable. TTE unremarkable - stop IV heparin, start apixaban  # Debility - PT advising SNF, patient is agreeable, TOC is working on bed search  # History prostate cancer PSA undetectable  # T2DM A1c in the 7s, euglycemic here - hold home orals - SSI  # Hypokalemia # Hypomagnesemia Denies vomiting/diarrhea, is on hydrochlorothiazide at home, also hypothyroid - replete today  # Hypothyroid Uncontrolled with elevated tsh and low t4, patient reports noncompliance w/ home synthroid. No obtundation, hypothermia, hyponatremia, hypotension to suggest myxedema coma, discussed w/ ICU attending today and he agrees. - resume home synthroid, repeat TFTs 4-6 wks - f/u acth stim (AM cortisol equivocal)  # Adrenal nodule, incidental - outpt f/u  # Elevated CK Most likely 2/2 acute dvt/pe, did have recent falls as well with inability to get up. CK stable 1-2k, no aki. No muscle pain or tenderness. - trend - continue fluids - holding statin  # HTN  Here normotensive - home losartan/hdtz on hold  # History prior strokes Seen on MRI, nothing acute - LDL is  130 so needs higher intensity statin but for now statin on hold 2/2 elevated Ck  # Obesity noted   DVT prophylaxis: apixaban Code Status: full Family Communication: family members updated @ bedside 11/11  Level of care: Progressive Status is: Inpatient Remains inpatient appropriate because: pending snf placement    Consultants:  none  Procedures: none  Antimicrobials:  none    Subjective: Reports feeling , no dyspnea, bm this morning, tolerating diet  Objective: Vitals:   09/27/23 1959 09/27/23 2343 09/28/23 0425 09/28/23 0738  BP: 130/71 135/62 (!) 137/52 133/64  Pulse: 61 (!) 57 (!) 57 (!) 56  Resp: 18 20 20 20   Temp: 98.5 F (36.9 C) 98.4 F (36.9 C) 98.6 F (37 C) 98.5 F (36.9 C)  TempSrc:   Oral Oral  SpO2: 97% 95% 99% 99%  Weight:      Height:        Intake/Output Summary (Last 24 hours) at 09/28/2023 1056 Last data filed at 09/28/2023 0600 Gross per 24 hour  Intake 2290.09 ml  Output 1650 ml  Net 640.09 ml   Filed Weights   09/26/23 0041  Weight: 102.3 kg    Examination:  General exam: Appears calm and comfortable  Respiratory system: Clear to auscultation. Respiratory effort normal. Cardiovascular system: S1 & S2 heard, bradycardic, distant heart sounds Gastrointestinal system: Abdomen is obese, soft and nontender. No organomegaly or masses felt. Central nervous system: Alert and oriented. No focal neurological deficits. Extremities: Symmetric 5 x 5 power. Pitting edema to knees Skin: No rashes, lesions or ulcers Psychiatry: Judgement and insight appear normal.  Mood & affect appropriate.     Data Reviewed: I have personally reviewed following labs and imaging studies  CBC: Recent Labs  Lab 09/25/23 1738 09/27/23 0719 09/28/23 0356  WBC 9.8 7.5 6.7  HGB 13.0 11.1* 11.2*  HCT 37.0* 31.7* 32.2*  MCV 87.3 87.6 88.7  PLT 330 296 276   Basic Metabolic Panel: Recent Labs  Lab 09/25/23 1738 09/26/23 0320 09/26/23 1300  09/26/23 2043 09/27/23 0719 09/27/23 1623 09/28/23 0356  NA 137 136  --  135 136  --  135  K 2.0* 2.6* 3.5 2.6* 2.5* 3.1* 3.0*  CL 96* 99  --  96* 97*  --  102  CO2 27 29  --  29 30  --  27  GLUCOSE 277* 145*  --  134* 117*  --  148*  BUN 12 12  --  9 10  --  8  CREATININE 1.22 1.12  --  1.14 0.94  --  0.98  CALCIUM 8.5* 7.9*  --  8.1* 8.0*  --  7.6*  MG 1.7 1.7  --  2.1 2.0  --  2.0  PHOS  --   --   --   --  2.6  --   --    GFR: Estimated Creatinine Clearance: 72 mL/min (by C-G formula based on SCr of 0.98 mg/dL). Liver Function Tests: Recent Labs  Lab 09/25/23 2207 09/26/23 0320  AST 36 26  ALT 28 22  ALKPHOS 51 47  BILITOT 0.7 0.9  PROT 6.8 6.4*  ALBUMIN 3.5 3.3*   No results for input(s): "LIPASE", "AMYLASE" in the last 168 hours. No results for input(s): "AMMONIA" in the last 168 hours. Coagulation Profile: Recent Labs  Lab 09/25/23 2207  INR 1.0   Cardiac Enzymes: Recent Labs  Lab 09/25/23 2207 09/26/23 0319 09/27/23 0719 09/28/23 0356  CKTOTAL 1,431* 1,151* 1,780* 2,004*   BNP (last 3 results) No results for input(s): "PROBNP" in the last 8760 hours. HbA1C: Recent Labs    09/25/23 1738  HGBA1C 7.2*   CBG: Recent Labs  Lab 09/27/23 0840 09/27/23 1125 09/27/23 1635 09/27/23 2209 09/28/23 0739  GLUCAP 117* 158* 136* 140* 155*   Lipid Profile: Recent Labs    09/27/23 0719  CHOL 198  HDL 42  LDLCALC 130*  TRIG 131  CHOLHDL 4.7   Thyroid Function Tests: Recent Labs    09/25/23 2207  TSH 35.582*  FREET4 <0.25*   Anemia Panel: No results for input(s): "VITAMINB12", "FOLATE", "FERRITIN", "TIBC", "IRON", "RETICCTPCT" in the last 72 hours. Urine analysis:    Component Value Date/Time   COLORURINE YELLOW (A) 09/26/2023 0320   APPEARANCEUR CLEAR (A) 09/26/2023 0320   APPEARANCEUR Clear 01/06/2018 1503   LABSPEC 1.021 09/26/2023 0320   PHURINE 7.0 09/26/2023 0320   GLUCOSEU 50 (A) 09/26/2023 0320   HGBUR SMALL (A) 09/26/2023 0320    BILIRUBINUR NEGATIVE 09/26/2023 0320   BILIRUBINUR Negative 01/06/2018 1503   KETONESUR NEGATIVE 09/26/2023 0320   PROTEINUR NEGATIVE 09/26/2023 0320   UROBILINOGEN 0.2 11/05/2017 1609   NITRITE NEGATIVE 09/26/2023 0320   LEUKOCYTESUR NEGATIVE 09/26/2023 0320   Sepsis Labs: @LABRCNTIP (procalcitonin:4,lacticidven:4)  )No results found for this or any previous visit (from the past 240 hour(s)).       Radiology Studies: ECHOCARDIOGRAM COMPLETE BUBBLE STUDY  Result Date: 09/27/2023    ECHOCARDIOGRAM REPORT   Patient Name:   CAROLD BRYS Date of Exam: 09/27/2023 Medical Rec #:  [id number]     Height:  67.0 in Accession #:    [id number]    Weight:       225.6 lb Date of Birth:  [birth year]     BSA:          2.128 m Patient Age:    77 years      BP:           148/104 mmHg Patient Gender: M             HR:           76 bpm. Exam Location:  [facility] Procedure: 2D Echo, Cardiac Doppler, Color Doppler, Strain Analysis and Saline            Contrast Bubble Study Indications:     Syncope  History:         Patient has no prior history of Echocardiogram examinations.                  Signs/Symptoms:Syncope and Dizziness/Lightheadedness; Risk                  Factors:Hypertension, Diabetes and Dyslipidemia. Pulmonary                  embolus.  Sonographer:     Mikki Harbor Referring Phys:  [id number] Eliezer Mccoy PATEL Diagnosing Phys: Lorine Bears MD  Sonographer Comments: Global longitudinal strain was attempted. IMPRESSIONS  1. Left ventricular ejection fraction, by estimation, is 55 to 60%. The left ventricle has normal function. The left ventricle has no regional wall motion abnormalities. There is mild asymmetric left ventricular hypertrophy of the basal-septal segment. Left ventricular diastolic parameters were normal.  2. Right ventricular systolic function is normal. The right ventricular size is normal. There is normal pulmonary artery systolic pressure.  3. The mitral valve is normal in structure. No  evidence of mitral valve regurgitation. No evidence of mitral stenosis.  4. The aortic valve is normal in structure. Aortic valve regurgitation is not visualized. No aortic stenosis is present.  5. The inferior vena cava is normal in size with greater than 50% respiratory variability, suggesting right atrial pressure of 3 mmHg.  6. Agitated saline contrast bubble study was negative, with no evidence of any interatrial shunt. FINDINGS  Left Ventricle: Left ventricular ejection fraction, by estimation, is 55 to 60%. The left ventricle has normal function. The left ventricle has no regional wall motion abnormalities. Global longitudinal strain performed but not reported based on interpreter judgement due to suboptimal tracking. The left ventricular internal cavity size was normal in size. There is mild asymmetric left ventricular hypertrophy of the basal-septal segment. Left ventricular diastolic parameters were normal. Right Ventricle: The right ventricular size is normal. No increase in right ventricular wall thickness. Right ventricular systolic function is normal. There is normal pulmonary artery systolic pressure. The tricuspid regurgitant velocity is 2.43 m/s, and  with an assumed right atrial pressure of 3 mmHg, the estimated right ventricular systolic pressure is 26.6 mmHg. Left Atrium: Left atrial size was normal in size. Right Atrium: Right atrial size was normal in size. Pericardium: There is no evidence of pericardial effusion. Mitral Valve: The mitral valve is normal in structure. No evidence of mitral valve regurgitation. No evidence of mitral valve stenosis. MV peak gradient, 2.9 mmHg. The mean mitral valve gradient is 1.0 mmHg. Tricuspid Valve: The tricuspid valve is normal in structure. Tricuspid valve regurgitation is trivial. No evidence of tricuspid stenosis. Aortic Valve: The aortic valve is normal in structure. Aortic valve regurgitation is  not visualized. No aortic stenosis is present. Aortic  valve mean gradient measures 3.0 mmHg. Aortic valve peak gradient measures 5.9 mmHg. Aortic valve area, by VTI measures 2.40 cm. Pulmonic Valve: The pulmonic valve was normal in structure. Pulmonic valve regurgitation is trivial. No evidence of pulmonic stenosis. Aorta: The aortic root is normal in size and structure. Venous: The inferior vena cava is normal in size with greater than 50% respiratory variability, suggesting right atrial pressure of 3 mmHg. IAS/Shunts: No atrial level shunt detected by color flow Doppler. Agitated saline contrast was given intravenously to evaluate for intracardiac shunting. Agitated saline contrast bubble study was negative, with no evidence of any interatrial shunt.  LEFT VENTRICLE PLAX 2D LVIDd:         4.40 cm     Diastology LVIDs:         2.70 cm     LV e' medial:    8.70 cm/s LV PW:         1.10 cm     LV E/e' medial:  8.6 LV IVS:        1.10 cm     LV e' lateral:   9.57 cm/s LVOT diam:     2.10 cm     LV E/e' lateral: 7.8 LV SV:         64 LV SV Index:   30 LVOT Area:     3.46 cm  LV Volumes (MOD) LV vol d, MOD A2C: 51.7 ml LV vol d, MOD A4C: 60.7 ml LV vol s, MOD A2C: 30.3 ml LV vol s, MOD A4C: 31.3 ml LV SV MOD A2C:     21.4 ml LV SV MOD A4C:     60.7 ml LV SV MOD BP:      27.0 ml RIGHT VENTRICLE RV Basal diam:  3.45 cm RV Mid diam:    3.30 cm RV S prime:     17.70 cm/s TAPSE (M-mode): 2.1 cm LEFT ATRIUM             Index        RIGHT ATRIUM           Index LA diam:        3.30 cm 1.55 cm/m   RA Area:     15.90 cm LA Vol (A2C):   50.9 ml 23.91 ml/m  RA Volume:   42.10 ml  19.78 ml/m LA Vol (A4C):   21.4 ml 10.05 ml/m LA Biplane Vol: 34.2 ml 16.07 ml/m  AORTIC VALVE                    PULMONIC VALVE AV Area (Vmax):    2.10 cm     PV Vmax:       0.71 m/s AV Area (Vmean):   2.16 cm     PV Peak grad:  2.0 mmHg AV Area (VTI):     2.40 cm AV Vmax:           121.00 cm/s AV Vmean:          73.800 cm/s AV VTI:            0.265 m AV Peak Grad:      5.9 mmHg AV Mean Grad:       3.0 mmHg LVOT Vmax:         73.40 cm/s LVOT Vmean:        46.000 cm/s LVOT VTI:          0.184 m  LVOT/AV VTI ratio: 0.69  AORTA Ao Root diam: 3.60 cm MITRAL VALVE               TRICUSPID VALVE MV Area (PHT): 2.87 cm    TR Peak grad:   23.6 mmHg MV Area VTI:   2.54 cm    TR Vmax:        243.00 cm/s MV Peak grad:  2.9 mmHg MV Mean grad:  1.0 mmHg    SHUNTS MV Vmax:       0.85 m/s    Systemic VTI:  0.18 m MV Vmean:      43.8 cm/s   Systemic Diam: 2.10 cm MV Decel Time: 264 msec MV E velocity: 74.40 cm/s MV A velocity: 73.90 cm/s MV E/A ratio:  1.01 Lorine Bears MD Electronically signed by Lorine Bears MD Signature Date/Time: 09/27/2023/3:45:28 PM    Final    US Venous Img Lower Bilateral (DVT)  Result Date: 09/26/2023 CLINICAL DATA:  Pulmonary embolus. EXAM: BILATERAL LOWER EXTREMITY VENOUS DOPPLER ULTRASOUND TECHNIQUE: Gray-scale sonography with graded compression, as well as color Doppler and duplex ultrasound were performed to evaluate the lower extremity deep venous systems from the level of the common femoral vein and including the common femoral, femoral, profunda femoral, popliteal and calf veins including the posterior tibial, peroneal and gastrocnemius veins when visible. The superficial great saphenous vein was also interrogated. Spectral Doppler was utilized to evaluate flow at rest and with distal augmentation maneuvers in the common femoral, femoral and popliteal veins. COMPARISON:  None Available. FINDINGS: RIGHT LOWER EXTREMITY Common Femoral Vein: No evidence of thrombus. Normal compressibility, respiratory phasicity and response to augmentation. Saphenofemoral Junction: No evidence of thrombus. Normal compressibility and flow on color Doppler imaging. Profunda Femoral Vein: No evidence of thrombus. Normal compressibility and flow on color Doppler imaging. Femoral Vein: No evidence of thrombus. Normal compressibility, respiratory phasicity and response to augmentation. Popliteal Vein: No  evidence of thrombus. Normal compressibility, respiratory phasicity and response to augmentation. Calf Veins: No evidence for thrombus in the posterior tibial vein. Peroneal vein not well visualized. Other Findings:  None. LEFT LOWER EXTREMITY Common Femoral Vein: No evidence of thrombus. Normal compressibility, respiratory phasicity and response to augmentation. Saphenofemoral Junction: No evidence of thrombus. Normal compressibility and flow on color Doppler imaging. Profunda Femoral Vein: No evidence of thrombus. Normal compressibility and flow on color Doppler imaging. Femoral Vein: No evidence of thrombus. Normal compressibility, respiratory phasicity and response to augmentation. Popliteal Vein: Occlusive thrombus identified in the inferior popliteal vein. Calf Veins: No evidence of thrombus. Normal compressibility and flow on color Doppler imaging. Other Findings:  None. IMPRESSION: 1. Occlusive thrombus in the inferior left popliteal vein in this patient with known DVT by CTA chest yesterday. 2. No evidence for DVT in the right lower extremity. Electronically Signed   By: Kennith Center M.D.   On: 09/26/2023 13:12        Scheduled Meds:  insulin aspart  0-6 Units Subcutaneous TID WC   levothyroxine  125 mcg Oral Q0600   potassium chloride  40 mEq Oral BID   sodium chloride flush  3 mL Intravenous Q12H   spironolactone  25 mg Oral Daily   Continuous Infusions:  dextrose 5 % and 0.45 % NaCl with KCl 20 mEq/L 75 mL/hr at 09/28/23 0600   heparin 1,200 Units/hr (09/28/23 0600)   potassium chloride 10 mEq (09/28/23 1026)     LOS: 3 days     Silvano Bilis, MD [facility]  If 7PM-7AM, please contact night-coverage [email or web address] Password [facility] 09/28/2023, 10:56 AM

## 2023-09-28 NOTE — Care Management Important Message (Signed)
Important Message  Patient Details  Name: Daniel Kirby MRN: [id number] Date of Birth: May 18, 1946   Important Message Given:  N/A - LOS <3 / Initial given by admissions     Olegario Messier A Kaedyn Belardo 09/28/2023, 11:24 AM

## 2023-09-28 NOTE — Progress Notes (Signed)
PHARMACY - ANTICOAGULATION CONSULT NOTE  Pharmacy Consult for Heparin  Indication: pulmonary embolus  No Known Allergies  Patient Measurements: Height: 5\' 7"  (170.2 cm) Weight: 102.3 kg (225 lb 9.6 oz) IBW/kg (Calculated) : 66.1 Heparin Dosing Weight: 88.5   Vital Signs: Temp: 98.6 F (37 C) (11/12 0425) Temp Source: Oral (11/12 0425) BP: 137/52 (11/12 0425) Pulse Rate: 57 (11/12 0425)  Labs: Recent Labs    09/25/23 1738 09/25/23 2207 09/25/23 2207 09/26/23 0050 09/26/23 0319 09/26/23 0320 09/26/23 0910 09/26/23 1300 09/26/23 1640 09/26/23 2043 09/27/23 0719 09/28/23 0356  HGB 13.0  --   --   --   --   --   --   --   --   --  11.1* 11.2*  HCT 37.0*  --   --   --   --   --   --   --   --   --  31.7* 32.2*  PLT 330  --   --   --   --   --   --   --   --   --  296 276  APTT  --  30  --   --   --   --   --   --   --   --   --   --   LABPROT  --  13.7  --   --   --   --   --   --   --   --   --   --   INR  --  1.0  --   --   --   --   --   --   --   --   --   --   HEPARINUNFRC  --   --   --   --   --   --    < >  --  0.57  --  0.59 0.66  CREATININE 1.22  --   --   --   --  1.12  --   --   --  1.14 0.94 0.98  CKTOTAL  --  1,431*  --   --  1,151*  --   --   --   --   --  1,780*  --   TROPONINIHS  --  20*   < > 22*  --  22*  --  16  --   --   --   --    < > = values in this interval not displayed.    Estimated Creatinine Clearance: 72 mL/min (by C-G formula based on SCr of 0.98 mg/dL).   Medical History: Past Medical History:  Diagnosis Date   Abdominal pain    RUQ   Arthritis    right ankle   ED (erectile dysfunction)    HLD (hyperlipidemia)    Hypertension    Hypothyroid    Prostate cancer (HCC)    Vertigo    1 episode only, approx 1 month ago    Assessment: Patient admitted with acute PE. PMH includes HTN, hypothyroidism, DM, hx of prostate cancer. obesity. Pharmacy consulted to dose heparin for acute PE.  No prior anticoag noted. CrCl = 57.8 ml/min     Date Time Results Comments 11/10 0910 HL 0.36 therapeutic x1 11/10 1640 HL 0.57 therapeutic x2 11/11 0719 HL 0.59 Therapeutic at 1200 units/hr 11/12 0356 HL 0.66 Therapeutic x 4   Goal of Therapy:  Heparin level 0.3-0.7 units/ml Monitor platelets  by anticoagulation protocol: Yes   Plan:  Continue heparin infusion at 1200 units/hr Next heparin level tomorrow AM Continue to monitor H&H and platelets  Otelia Sergeant, PharmD, [facility] 09/28/2023 4:42 AM

## 2023-09-28 NOTE — Progress Notes (Signed)
Physical Therapy Treatment Patient Details Name: Daniel Kirby MRN: [id number] DOB: 1946-06-19 Today's Date: 09/28/2023   History of Present Illness Pt is 77 y/o admitted 09/25/23 for Acute Pulmonary Embolism. Pt has been experiencing low potassium levels. PmHx includes: Essential HTN, DVT, generalized weakness, and syncope/collapse episodes.    PT Comments  Pt received in bed on RA and agreed to PT session. Pt performs bed mobility CGA with verbal cues for technique/efficiency, STS with the use of RW (2wheels) MinA with VC necessary for RW management, and amb to recliner with RW CGA. VC necessary during amb for LLE sequencing while performing a step-to gait pattern. Pt completed session on RA and maintained Spo2 levels of 96-99%. HR vitals read 53-65 bpm throughout session, RN notified of mobility status and vitals. Pt tolerated Tx well today and will continue to benefit from skilled PT sessions to improve strength, endurance, functional mobility, and activity tolerance.    If plan is discharge home, recommend the following: A little help with walking and/or transfers;A little help with bathing/dressing/bathroom;Assist for transportation;Help with stairs or ramp for entrance   Can travel by private vehicle     Yes  Equipment Recommendations  Rolling walker (2 wheels)    Recommendations for Other Services       Precautions / Restrictions Precautions Precautions: Fall Restrictions Weight Bearing Restrictions: No     Mobility  Bed Mobility Overal bed mobility: Needs Assistance Bed Mobility: Supine to Sit     Supine to sit: Contact guard     General bed mobility comments: Pt performed bed mobility CGA without reports of s/sx relative to dizziness.    Transfers Overall transfer level: Needs assistance Equipment used: Rolling walker (2 wheels) Transfers: Sit to/from Stand Sit to Stand: Min assist           General transfer comment: Pt required MinA to perform STS with the  use of RW (2wheels). Pt did not report any s/sx relative to dizziness.    Ambulation/Gait Ambulation/Gait assistance: Contact guard assist Gait Distance (Feet): 5 Feet Assistive device: Rolling walker (2 wheels) Gait Pattern/deviations: Step-to pattern Gait velocity: decreased     General Gait Details: Pt amb to recliner with the use of RW (2wheels). VC necessary for step pattern sequencing for the LLE   Stairs             Wheelchair Mobility     Tilt Bed    Modified Rankin (Stroke Patients Only)       Balance Overall balance assessment: Needs assistance Sitting-balance support: Feet supported, Single extremity supported Sitting balance-Leahy Scale: Good     Standing balance support: Bilateral upper extremity supported, During functional activity Standing balance-Leahy Scale: Fair                              Cognition Arousal: Alert Behavior During Therapy: WFL for tasks assessed/performed Overall Cognitive Status: Within Functional Limits for tasks assessed                                 General Comments: AOx4. Pt pleasant and willing to participate in PT session.        Exercises      General Comments        Pertinent Vitals/Pain Pain Assessment Pain Assessment: No/denies pain    Home Living  Prior Function            PT Goals (current goals can now be found in the care plan section) Acute Rehab PT Goals Patient Stated Goal: To get better PT Goal Formulation: With patient Time For Goal Achievement: 10/11/23 Potential to Achieve Goals: Good Progress towards PT goals: Progressing toward goals    Frequency    Min 1X/week      PT Plan      Co-evaluation              AM-PAC PT "6 Clicks" Mobility   Outcome Measure  Help needed turning from your back to your side while in a flat bed without using bedrails?: A Little Help needed moving from lying on your back to  sitting on the side of a flat bed without using bedrails?: A Little Help needed moving to and from a bed to a chair (including a wheelchair)?: A Little Help needed standing up from a chair using your arms (e.g., wheelchair or bedside chair)?: A Little Help needed to walk in hospital room?: A Lot Help needed climbing 3-5 steps with a railing? : Total 6 Click Score: 15    End of Session Equipment Utilized During Treatment: Gait belt Activity Tolerance: Patient tolerated treatment well Patient left: in bed;with call bell/phone within reach;with chair alarm set Nurse Communication: Mobility status;Other (comment) (Pt vitals) PT Visit Diagnosis: Unsteadiness on feet (R26.81);Other abnormalities of gait and mobility (R26.89);Muscle weakness (generalized) (M62.81);Difficulty in walking, not elsewhere classified (R26.2);Pain     Time: 5621-3086 PT Time Calculation (min) (ACUTE ONLY): 21 min  Charges:    $Gait Training: 8-22 mins PT General Charges $$ ACUTE PT VISIT: 1 Visit                    Izael Bessinger Sauv[place] Howard SPT, LAT, ATC   Ulises Wolfinger Sauvignon-Howard 09/28/2023, 3:13 PM

## 2023-09-28 NOTE — Plan of Care (Signed)

## 2023-09-28 NOTE — Consult Note (Addendum)
PHARMACY - ANTICOAGULATION CONSULT NOTE  Pharmacy Consult for Apixaban Indication: pulmonary embolus  No Known Allergies  Patient Measurements: Height: 5\' 7"  (170.2 cm) Weight: 102.3 kg (225 lb 9.6 oz) IBW/kg (Calculated) : 66.1 Heparin Dosing Weight: NA  Vital Signs: Temp: 98.5 F (36.9 C) (11/12 0738) Temp Source: Oral (11/12 0738) BP: 133/64 (11/12 0738) Pulse Rate: 56 (11/12 0738)  Labs: Recent Labs    09/25/23 1738 09/25/23 2207 09/25/23 2207 09/26/23 0050 09/26/23 0319 09/26/23 0320 09/26/23 0910 09/26/23 1300 09/26/23 1640 09/26/23 2043 09/27/23 0719 09/28/23 0356  HGB 13.0  --   --   --   --   --   --   --   --   --  11.1* 11.2*  HCT 37.0*  --   --   --   --   --   --   --   --   --  31.7* 32.2*  PLT 330  --   --   --   --   --   --   --   --   --  296 276  APTT  --  30  --   --   --   --   --   --   --   --   --   --   LABPROT  --  13.7  --   --   --   --   --   --   --   --   --   --   INR  --  1.0  --   --   --   --   --   --   --   --   --   --   HEPARINUNFRC  --   --   --   --   --   --    < >  --  0.57  --  0.59 0.66  CREATININE 1.22  --   --   --   --  1.12  --   --   --  1.14 0.94 0.98  CKTOTAL  --  1,431*   < >  --  1,151*  --   --   --   --   --  1,780* 2,004*  TROPONINIHS  --  20*   < > 22*  --  22*  --  16  --   --   --   --    < > = values in this interval not displayed.    Estimated Creatinine Clearance: 72 mL/min (by C-G formula based on SCr of 0.98 mg/dL).   Medical History: Past Medical History:  Diagnosis Date   Abdominal pain    RUQ   Arthritis    right ankle   ED (erectile dysfunction)    HLD (hyperlipidemia)    Hypertension    Hypothyroid    Prostate cancer (HCC)    Vertigo    1 episode only, approx 1 month ago   Assessment: Daniel Kirby is a 77 yo M who presented with a several week history of weakness, lightheadedness and syncope. Imaging on 11/10 Chest CT found segmental pulmonary emboli in the right upper and lower  pulmonary arteries. Hemodynamically stable and transitioned from Heparin to apixaban today. Copay for apixaban is $25/30d supply. Starting at 10 mg BID x 7 days and then 5 mg BID.  Effie Shy, PharmD Pharmacy Resident  09/28/2023 11:41 AM

## 2023-09-28 NOTE — Progress Notes (Signed)
Progress Note  Patient Name: Daniel Kirby Date of Encounter: 09/28/2023  Primary Cardiologist: New - consult by Kirke Corin  Subjective   No chest pain, dyspnea, palpitations, dizziness, presyncope, or syncope. No further runs of polymorphic VT. Echo 11/11 showed preserved LVSF with normal wall motion. Potassium remains low at 3.0, currently being repleted via IV. Magnesium 2.0.   Inpatient Medications    Scheduled Meds:  insulin aspart  0-6 Units Subcutaneous TID WC   levothyroxine  125 mcg Oral Q0600   potassium chloride  40 mEq Oral BID   sodium chloride flush  3 mL Intravenous Q12H   spironolactone  25 mg Oral Daily   Continuous Infusions:  potassium chloride 10 mEq (09/28/23 1026)   PRN Meds: hydrALAZINE   Vital Signs    Vitals:   09/27/23 1959 09/27/23 2343 09/28/23 0425 09/28/23 0738  BP: 130/71 135/62 (!) 137/52 133/64  Pulse: 61 (!) 57 (!) 57 (!) 56  Resp: 18 20 20 20   Temp: 98.5 F (36.9 C) 98.4 F (36.9 C) 98.6 F (37 C) 98.5 F (36.9 C)  TempSrc:   Oral Oral  SpO2: 97% 95% 99% 99%  Weight:      Height:        Intake/Output Summary (Last 24 hours) at 09/28/2023 1103 Last data filed at 09/28/2023 0600 Gross per 24 hour  Intake 2290.09 ml  Output 1650 ml  Net 640.09 ml   Filed Weights   09/26/23 0041  Weight: 102.3 kg    Telemetry    SR with occasional isolated PVCs, with a rare episode of ventricular bigeminy, no NSVT or polymorphic VT - Personally Reviewed  ECG    No new tracings - Personally Reviewed  Physical Exam   GEN: No acute distress.   Neck: No JVD. Cardiac: RRR, no murmurs, rubs, or gallops.  Respiratory: Clear to auscultation bilaterally.  GI: Soft, nontender, non-distended.   MS: No edema; No deformity. Neuro:  Alert and oriented x 3; Nonfocal.  Psych: Normal affect.  Labs    Chemistry Recent Labs  Lab 09/25/23 2207 09/26/23 0320 09/26/23 1300 09/26/23 2043 09/27/23 0719 09/27/23 1623 09/28/23 0356  NA  --   136  --  135 136  --  135  K  --  2.6*   < > 2.6* 2.5* 3.1* 3.0*  CL  --  99  --  96* 97*  --  102  CO2  --  29  --  29 30  --  27  GLUCOSE  --  145*  --  134* 117*  --  148*  BUN  --  12  --  9 10  --  8  CREATININE  --  1.12  --  1.14 0.94  --  0.98  CALCIUM  --  7.9*  --  8.1* 8.0*  --  7.6*  PROT 6.8 6.4*  --   --   --   --   --   ALBUMIN 3.5 3.3*  --   --   --   --   --   AST 36 26  --   --   --   --   --   ALT 28 22  --   --   --   --   --   ALKPHOS 51 47  --   --   --   --   --   BILITOT 0.7 0.9  --   --   --   --   --  GFRNONAA  --  >60  --  >60 >60  --  >60  ANIONGAP  --  8  --  10 9  --  6   < > = values in this interval not displayed.     Hematology Recent Labs  Lab 09/25/23 1738 09/27/23 0719 09/28/23 0356  WBC 9.8 7.5 6.7  RBC 4.24 3.62* 3.63*  HGB 13.0 11.1* 11.2*  HCT 37.0* 31.7* 32.2*  MCV 87.3 87.6 88.7  MCH 30.7 30.7 30.9  MCHC 35.1 35.0 34.8  RDW 14.1 13.6 13.2  PLT 330 296 276    Cardiac EnzymesNo results for input(s): "TROPONINI" in the last 168 hours. No results for input(s): "TROPIPOC" in the last 168 hours.   BNPNo results for input(s): "BNP", "PROBNP" in the last 168 hours.   DDimer  Recent Labs  Lab 09/25/23 2207  DDIMER 9.18*     Radiology    US Venous Img Lower Bilateral (DVT)  Result Date: 09/26/2023 IMPRESSION: 1. Occlusive thrombus in the inferior left popliteal vein in this patient with known DVT by CTA chest yesterday. 2. No evidence for DVT in the right lower extremity. Electronically Signed   By: Kennith Center M.D.   On: 09/26/2023 13:12    Cardiac Studies   2D echo 09/27/2023: 1. Left ventricular ejection fraction, by estimation, is 55 to 60%. The  left ventricle has normal function. The left ventricle has no regional  wall motion abnormalities. There is mild asymmetric left ventricular  hypertrophy of the basal-septal segment.  Left ventricular diastolic parameters were normal.   2. Right ventricular systolic function  is normal. The right ventricular  size is normal. There is normal pulmonary artery systolic pressure.   3. The mitral valve is normal in structure. No evidence of mitral valve  regurgitation. No evidence of mitral stenosis.   4. The aortic valve is normal in structure. Aortic valve regurgitation is  not visualized. No aortic stenosis is present.   5. The inferior vena cava is normal in size with greater than 50%  respiratory variability, suggesting right atrial pressure of 3 mmHg.   6. Agitated saline contrast bubble study was negative, with no evidence  of any interatrial shunt.   Patient Profile     77 y.o. male with history of DM2, HTN, and untreated hypothyroidism who was admitted with bilateral PE and we are seeing for evaluation of polymorphic VT.   Assessment & Plan    1. Polymorphic VT and frequent PVCs: -Improved PVC burden with no further episodes of polymorphic VT -Likely in the setting of prolonged QT with possible U waves on EKG secondary to severe hypokalemia with severe hypothyroidism  -Echo with preserved LVSF and normal wall motion  -Replete potassium to goal 4.0 -Magnesium at goal -Would plan for ischemic evaluation, ideally in the outpatient setting given acute PE, hypothyroidism, and electrolyte abnormalities, and in the context of preserved LVSF, normal wall motion, and lack of angina -Underlying bradycardia precludes addition of beta blocker at this time  2. Hypothyroidism: -Consider endocrinology consultation to see if IV levothyroxine or concomitant IV steroids are needed considering his systemic manifestations with bradycardia, mild rhabdomyolysis and severe electrolyte abnormalities  -On replacement therapy  -Per IM  3. Severe hypokalemia: -Improving -Add spironolactone 25 mg daily -Being repleted via IV -Magnesium at goal  4. Pulmonary embolism: -Currently on heparin gtt -No evidence of right heart strain on echo    For questions or updates,  please contact [facility] Please consult  [email or web address] for contact info under Cardiology/STEMI.    Signed, Eula Listen, PA-C [facility] Pager: [phone] 09/28/2023, 11:03 AM

## 2023-09-28 NOTE — Consult Note (Addendum)
PHARMACY CONSULT NOTE - ELECTROLYTES  Pharmacy Consult for Electrolyte Monitoring and Replacement   Recent Labs: Height: 5\' 7"  (170.2 cm) Weight: 102.3 kg (225 lb 9.6 oz) IBW/kg (Calculated) : 66.1 Estimated Creatinine Clearance: 72 mL/min (by C-G formula based on SCr of 0.98 mg/dL). Potassium (mmol/L)  Date Value  09/28/2023 3.0 (L)   Magnesium (mg/dL)  Date Value  44/01/4741 2.0   Calcium (mg/dL)  Date Value  59/56/3875 7.6 (L)   Albumin (g/dL)  Date Value  64/33/2951 3.3 (L)  01/14/2023 4.5   Phosphorus (mg/dL)  Date Value  88/41/6606 2.6   Sodium (mmol/L)  Date Value  09/28/2023 135  01/14/2023 135   Corrected Ca: 8.5 mg/dL  Assessment  Daniel Kirby is a 77 y.o. male presenting with lightheadedness and syncope. PMH significant for PE, T2DM, hypothyroidism. Pharmacy has been consulted to monitor and replace electrolytes.  Diet: Carb Modified; Thin MIVF: D51/2NS and 20 meq KCL @ 53ml/hr x 1 day Pertinent medications: NA  Goal of Therapy: Electrolytes WNL  Plan:  K 3.0; will order Kcl x 1 dose and Kcl IV q1hr x 2 doses.All other electrolytes within normal limits. Check BMP and Mg with AM labs per MD orders  Thank you for allowing pharmacy to be a part of this patient's care.  Faisal Stradling Rodriguez-Guzman PharmD, BCPS 09/28/2023 7:44 AM

## 2023-09-28 NOTE — TOC Initial Note (Addendum)
Transition of Care [facility]) - Initial/Assessment Note    Patient Details  Name: Daniel Kirby MRN: [id number] Date of Birth: 1946-02-09  [facility] CM/SW Contact:    Darolyn Rua, LCSW Phone Number: 09/28/2023, 10:24 AM  Clinical Narrative:                  CSW met with patient at bedside to review SNF recommendations by PT/OT, patient is in agreement as he reports he lives home alone and realizes he is too weak to care for  himself at home currently.   Patient lives in [place], agreeable for referrals to be sent out.   Referrals sent, pending bed offers at this time.   PASRR number is unable to be acquired at this time as patient's social he has reported is coming up under a different person on PASRR end, they are requesting a copy of patient's SSN be faxed to [phone]  Patient reports he will get his son to look for this when he visits, does acknowledge having issues in the past with his SSN.  Expected Discharge Plan: Skilled Nursing Facility Barriers to Discharge: Continued Medical Work up   Patient Goals and CMS Choice Patient states their goals for this hospitalization and ongoing recovery are:: to go home CMS Medicare.gov Compare Post Acute Care list provided to:: Patient Choice offered to / list presented to : Patient      Expected Discharge Plan and Services       Living arrangements for the past 2 months: Single Family Home                                      Prior Living Arrangements/Services Living arrangements for the past 2 months: Single Family Home Lives with:: Self                   Activities of Daily Living      Permission Sought/Granted                  Emotional Assessment Appearance:: Appears stated age Attitude/Demeanor/Rapport: Gracious Affect (typically observed): Calm Orientation: : Oriented to Place, Oriented to  Time, Oriented to Self, Oriented to Situation Alcohol / Substance Use: Not  Applicable Psych Involvement: No (comment)  Admission diagnosis:  Syncope and collapse [R55] Hypokalemia [E87.6] Weakness [R53.1] Patient Active Problem List   Diagnosis Date Noted   Electrolyte abnormality 09/26/2023   Acute pulmonary embolism (HCC) 09/26/2023   Obesity (BMI 30-39.9) 09/26/2023   DVT (deep venous thrombosis) (HCC) 09/26/2023   Generalized weakness 09/25/2023   Dizziness 09/25/2023   Syncope and collapse 09/25/2023   Type 2 diabetes mellitus without complication, without long-term current use of insulin (HCC) 01/18/2018   Chronic low back pain without sciatica 06/08/2017   Essential hypertension 12/08/2016   Adult hypothyroidism 12/08/2016   Arthritis 12/08/2016   Acute anxiety 12/08/2016   Cervical radiculopathy 12/08/2016   Mixed hyperlipidemia 12/08/2016   Special screening for malignant neoplasms, colon    Benign neoplasm of ascending colon    Major depressive disorder with single episode, in partial remission (HCC) 08/20/2015   Erectile dysfunction 06/04/2015   History of prostate cancer 06/04/2015   PCP:  Duanne Limerick, MD Pharmacy:   [facility] [address] [place] - [address] [place] [address] Phone: [phone] Fax: [phone]  [facility] -  [place], [place] - [address] [place] [address] Phone: [phone] Fax: [phone]  [facility] - [place], [place] - [address] [place] [address] Phone: [phone] Fax: [phone]     Social Determinants of Health (SDOH) Social History: SDOH Screenings   Food Insecurity: Food Insecurity Present (11/23/2022)  Housing: Low Risk  (11/23/2022)  Transportation Needs: No Transportation Needs (11/23/2022)  Utilities: Not At Risk (11/23/2022)  Alcohol Screen: Low Risk  (11/05/2021)  Depression (PHQ2-9): Low Risk  (05/14/2023)  Financial Resource Strain: Low Risk   (11/23/2022)  Physical Activity: Inactive (11/23/2022)  Social Connections: Socially Isolated (11/23/2022)  Stress: No Stress Concern Present (11/23/2022)  Tobacco Use: High Risk (09/25/2023)   SDOH Interventions:     Readmission Risk Interventions     No data to display

## 2023-09-29 DIAGNOSIS — E876 Hypokalemia: Principal | ICD-10-CM

## 2023-09-29 DIAGNOSIS — R531 Weakness: Secondary | ICD-10-CM | POA: Diagnosis not present

## 2023-09-29 DIAGNOSIS — R42 Dizziness and giddiness: Secondary | ICD-10-CM

## 2023-09-29 DIAGNOSIS — I2699 Other pulmonary embolism without acute cor pulmonale: Secondary | ICD-10-CM | POA: Diagnosis not present

## 2023-09-29 DIAGNOSIS — E119 Type 2 diabetes mellitus without complications: Secondary | ICD-10-CM

## 2023-09-29 LAB — BASIC METABOLIC PANEL
Anion gap: 11 (ref 5–15)
BUN: 9 mg/dL (ref 8–23)
CO2: 26 mmol/L (ref 22–32)
Calcium: 7.8 mg/dL — ABNORMAL LOW (ref 8.9–10.3)
Chloride: 101 mmol/L (ref 98–111)
Creatinine, Ser: 0.9 mg/dL (ref 0.61–1.24)
GFR, Estimated: >60 mL/min (ref >60)
Glucose, Bld: 128 mg/dL — ABNORMAL HIGH (ref 70–99)
Potassium: 3.5 mmol/L (ref 3.5–5.1)
Sodium: 138 mmol/L (ref 135–145)

## 2023-09-29 LAB — CBC
HCT: 32.3 % — ABNORMAL LOW (ref 39.0–52.0)
Hemoglobin: 11 g/dL — ABNORMAL LOW (ref 13.0–17.0)
MCH: 30.3 pg (ref 26.0–34.0)
MCHC: 34.1 g/dL (ref 30.0–36.0)
MCV: 89 fL (ref 80.0–100.0)
Platelets: 270 10*3/uL (ref 150–400)
RBC: 3.63 MIL/uL — ABNORMAL LOW (ref 4.22–5.81)
RDW: 13.3 % (ref 11.5–15.5)
WBC: 6.6 10*3/uL (ref 4.0–10.5)
nRBC: 0 % (ref 0.0–0.2)

## 2023-09-29 LAB — GLUCOSE, CAPILLARY
Glucose-Capillary: 134 mg/dL — ABNORMAL HIGH (ref 70–99)
Glucose-Capillary: 136 mg/dL — ABNORMAL HIGH (ref 70–99)
Glucose-Capillary: 168 mg/dL — ABNORMAL HIGH (ref 70–99)
Glucose-Capillary: 187 mg/dL — ABNORMAL HIGH (ref 70–99)

## 2023-09-29 LAB — CK: Total CK: 2129 U/L — ABNORMAL HIGH (ref 49–397)

## 2023-09-29 LAB — MAGNESIUM: Magnesium: 1.9 mg/dL (ref 1.7–2.4)

## 2023-09-29 MED ORDER — POTASSIUM CHLORIDE CRYS ER 20 MEQ PO TBCR
20.0000 meq | EXTENDED_RELEASE_TABLET | Freq: Once | ORAL | Status: AC
  Administered 2023-09-29: 20 meq via ORAL
  Filled 2023-09-29: qty 1

## 2023-09-29 NOTE — TOC Progression Note (Signed)
Transition of Care 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 He reports wanting to go home with home health services and getting a RW and 3in1  CSW received call from spouse Daniel Kirby at [phone] she is noted to not be on patient's contact chart, CSW spoke with patient received verbal permission to talk with her.   Daniel Kirby reports they are still married but separated, reports patient's home is not in good shape as he's been unable to care for himself and incontinent, lives alone. Reports he has to go to a facility, CSW informed her patient is still able to make his own decisions and encouraged her and family to discuss disposition with patient more, hoping for him to gain insight into needing to go to snf.   Daniel Kirby is agreeable with plan, reports that she will also follow up in regards to [facility] card, aware that we need this in order to obtain PASRR number which is required for patient to go to any snf.   List of bed offers also provided to Champ.   Expected Discharge Plan: Skilled Nursing Facility Barriers to Discharge: Continued Medical Work up  Expected Discharge Plan and Services       Living arrangements for the past 2 months: Single Family Home                                       Social Determinants of Health (SDOH) Interventions SDOH Screenings   Food Insecurity: Food Insecurity Present (11/23/2022)  Housing: Low Risk  (11/23/2022)  Transportation Needs: No Transportation Needs (11/23/2022)  Utilities: Not At Risk (11/23/2022)  Alcohol Screen: Low Risk  (11/05/2021)  Depression (PHQ2-9): Low Risk  (05/14/2023)  Financial Resource Strain: Low  Risk  (11/23/2022)  Physical Activity: Inactive (11/23/2022)  Social Connections: Socially Isolated (11/23/2022)  Stress: No Stress Concern Present (11/23/2022)  Tobacco Use: High Risk (09/25/2023)    Readmission Risk Interventions     No data to display

## 2023-09-29 NOTE — Progress Notes (Signed)
PROGRESS NOTE    Daniel Kirby  WJX:[id number] DOB: 05/27/1946 DOA: 09/25/2023 PCP: Duanne Limerick, MD    Brief Narrative:   Presented with several weeks weakness and lightneadedness and syncope with ambulation and difficulty getting up from the ground and lower extremity swelling, found to have bilateral PE    Assessment & Plan:   Principal Problem:   Acute pulmonary embolism (HCC) Active Problems:   Syncope and collapse   Essential hypertension   Adult hypothyroidism   Type 2 diabetes mellitus without complication, without long-term current use of insulin (HCC)   Generalized weakness   Dizziness   Electrolyte abnormality   Obesity (BMI 30-39.9)   DVT (deep venous thrombosis) (HCC)   Ventricular tachycardia (HCC)  # Acute pulmonary embolism # DVT B/l, no evidence heart strain, also left popliteal DVT.  Hemodynamically stable. TTE unremarkable -Continue apixaban   # Debility - PT advising SNF, patient is agreeable, bed search initiated   # History prostate cancer PSA undetectable   # T2DM A1c in the 7s, euglycemic here - hold home orals - SSI   # Hypokalemia # Hypomagnesemia Denies vomiting/diarrhea, is on hydrochlorothiazide at home, also hypothyroid - replete as needed   # Hypothyroid Uncontrolled with elevated tsh and low t4, patient reports noncompliance w/ home synthroid. No obtundation, hypothermia, hyponatremia, hypotension to suggest myxedema coma, discussed w/ ICU attending today and he agrees. - resume home synthroid, repeat TFTs 4-6 wks   # Adrenal nodule, incidental - outpt f/u   # Elevated CK Most likely 2/2 acute dvt/pe, did have recent falls as well with inability to get up. CK stable 1-2k, no aki. No muscle pain or tenderness. -Under 3000.  No need to trend   # HTN  Here normotensive - home losartan/hdtz on hold   # History prior strokes Seen on MRI, nothing acute - LDL is 130 so needs higher intensity statin but for now statin on  hold 2/2 elevated Ck   # Obesity noted      DVT prophylaxis: Apixaban Code Status: Full Family Communication: None Disposition Plan: Status is: Inpatient Remains inpatient appropriate because: Unsafe discharge plan.  Pending skilled nursing facility bed.   Level of care: Progressive  Consultants:  Cardiology  Procedures:  None  Antimicrobials: None   Subjective: Seen and examined.  Sitting up eating breakfast.  No visible distress.  No complaints of pain.  Objective: Vitals:   09/29/23 0825 09/29/23 0828 09/29/23 0830 09/29/23 1212  BP: 133/69 (!) 145/66 (!) 145/85 (!) 142/72  Pulse: (!) 52 (!) 58 77 (!) 57  Resp: 20   20  Temp: 98.6 F (37 C)   98.2 F (36.8 C)  TempSrc: Oral   Oral  SpO2: 95% 98% 100% 100%  Weight:      Height:        Intake/Output Summary (Last 24 hours) at 09/29/2023 1438 Last data filed at 09/29/2023 1212 Gross per 24 hour  Intake 240 ml  Output 2500 ml  Net -2260 ml   Filed Weights   09/26/23 0041 09/29/23 0500  Weight: 102.3 kg 98.7 kg    Examination:  General exam: Appears calm and comfortable  Respiratory system: Clear to auscultation. Respiratory effort normal. Cardiovascular system: S1-2, RRR, no murmurs, no pedal edema Gastrointestinal system: Soft, NT/ND, normal bowel sounds Central nervous system: Alert and oriented. No focal neurological deficits. Extremities: Symmetric 5 x 5 power. Skin: No rashes, lesions or ulcers Psychiatry: Judgement and insight appear normal. Mood &  affect appropriate.     Data Reviewed: I have personally reviewed following labs and imaging studies  CBC: Recent Labs  Lab 09/25/23 1738 09/27/23 0719 09/28/23 0356 09/29/23 0449  WBC 9.8 7.5 6.7 6.6  HGB 13.0 11.1* 11.2* 11.0*  HCT 37.0* 31.7* 32.2* 32.3*  MCV 87.3 87.6 88.7 89.0  PLT 330 296 276 270   Basic Metabolic Panel: Recent Labs  Lab 09/26/23 0320 09/26/23 1300 09/26/23 2043 09/27/23 0719 09/27/23 1623 09/28/23 0356  09/29/23 0449  NA 136  --  135 136  --  135 138  K 2.6*   < > 2.6* 2.5* 3.1* 3.0* 3.5  CL 99  --  96* 97*  --  102 101  CO2 29  --  29 30  --  27 26  GLUCOSE 145*  --  134* 117*  --  148* 128*  BUN 12  --  9 10  --  8 9  CREATININE 1.12  --  1.14 0.94  --  0.98 0.90  CALCIUM 7.9*  --  8.1* 8.0*  --  7.6* 7.8*  MG 1.7  --  2.1 2.0  --  2.0 1.9  PHOS  --   --   --  2.6  --   --   --    < > = values in this interval not displayed.   GFR: Estimated Creatinine Clearance: 76.9 mL/min (by C-G formula based on SCr of 0.9 mg/dL). Liver Function Tests: Recent Labs  Lab 09/25/23 2207 09/26/23 0320  AST 36 26  ALT 28 22  ALKPHOS 51 47  BILITOT 0.7 0.9  PROT 6.8 6.4*  ALBUMIN 3.5 3.3*   No results for input(s): "LIPASE", "AMYLASE" in the last 168 hours. No results for input(s): "AMMONIA" in the last 168 hours. Coagulation Profile: Recent Labs  Lab 09/25/23 2207  INR 1.0   Cardiac Enzymes: Recent Labs  Lab 09/25/23 2207 09/26/23 0319 09/27/23 0719 09/28/23 0356 09/29/23 0449  CKTOTAL 1,431* 1,151* 1,780* 2,004* 2,129*   BNP (last 3 results) No results for input(s): "PROBNP" in the last 8760 hours. HbA1C: No results for input(s): "HGBA1C" in the last 72 hours. CBG: Recent Labs  Lab 09/28/23 1146 09/28/23 1605 09/28/23 2047 09/29/23 0821 09/29/23 1209  GLUCAP 178* 138* 132* 134* 136*   Lipid Profile: Recent Labs    09/27/23 0719  CHOL 198  HDL 42  LDLCALC 130*  TRIG 131  CHOLHDL 4.7   Thyroid Function Tests: No results for input(s): "TSH", "T4TOTAL", "FREET4", "T3FREE", "THYROIDAB" in the last 72 hours. Anemia Panel: No results for input(s): "VITAMINB12", "FOLATE", "FERRITIN", "TIBC", "IRON", "RETICCTPCT" in the last 72 hours. Sepsis Labs: Recent Labs  Lab 09/25/23 2207 09/26/23 0055 09/27/23 0719  PROCALCITON <0.10  --   --   LATICACIDVEN 3.4* 2.9* 1.8    No results found for this or any previous visit (from the past 240 hour(s)).        Radiology Studies: ECHOCARDIOGRAM COMPLETE BUBBLE STUDY  Result Date: 09/27/2023    ECHOCARDIOGRAM REPORT   Patient Name:   Daniel Kirby Date of Exam: 09/27/2023 Medical Rec #:  [id number]     Height:       67.0 in Accession #:    [id number]    Weight:       225.6 lb Date of Birth:  November 03, 1946     BSA:          2.128 m Patient Age:    77 years  BP:           148/104 mmHg Patient Gender: M             HR:           76 bpm. Exam Location:  [facility] Procedure: 2D Echo, Cardiac Doppler, Color Doppler, Strain Analysis and Saline            Contrast Bubble Study Indications:     Syncope  History:         Patient has no prior history of Echocardiogram examinations.                  Signs/Symptoms:Syncope and Dizziness/Lightheadedness; Risk                  Factors:Hypertension, Diabetes and Dyslipidemia. Pulmonary                  embolus.  Sonographer:     Mikki Harbor Referring Phys:  [id number] Eliezer Mccoy PATEL Diagnosing Phys: Lorine Bears MD  Sonographer Comments: Global longitudinal strain was attempted. IMPRESSIONS  1. Left ventricular ejection fraction, by estimation, is 55 to 60%. The left ventricle has normal function. The left ventricle has no regional wall motion abnormalities. There is mild asymmetric left ventricular hypertrophy of the basal-septal segment. Left ventricular diastolic parameters were normal.  2. Right ventricular systolic function is normal. The right ventricular size is normal. There is normal pulmonary artery systolic pressure.  3. The mitral valve is normal in structure. No evidence of mitral valve regurgitation. No evidence of mitral stenosis.  4. The aortic valve is normal in structure. Aortic valve regurgitation is not visualized. No aortic stenosis is present.  5. The inferior vena cava is normal in size with greater than 50% respiratory variability, suggesting right atrial pressure of 3 mmHg.  6. Agitated saline contrast bubble study was negative, with no evidence of any  interatrial shunt. FINDINGS  Left Ventricle: Left ventricular ejection fraction, by estimation, is 55 to 60%. The left ventricle has normal function. The left ventricle has no regional wall motion abnormalities. Global longitudinal strain performed but not reported based on interpreter judgement due to suboptimal tracking. The left ventricular internal cavity size was normal in size. There is mild asymmetric left ventricular hypertrophy of the basal-septal segment. Left ventricular diastolic parameters were normal. Right Ventricle: The right ventricular size is normal. No increase in right ventricular wall thickness. Right ventricular systolic function is normal. There is normal pulmonary artery systolic pressure. The tricuspid regurgitant velocity is 2.43 m/s, and  with an assumed right atrial pressure of 3 mmHg, the estimated right ventricular systolic pressure is 26.6 mmHg. Left Atrium: Left atrial size was normal in size. Right Atrium: Right atrial size was normal in size. Pericardium: There is no evidence of pericardial effusion. Mitral Valve: The mitral valve is normal in structure. No evidence of mitral valve regurgitation. No evidence of mitral valve stenosis. MV peak gradient, 2.9 mmHg. The mean mitral valve gradient is 1.0 mmHg. Tricuspid Valve: The tricuspid valve is normal in structure. Tricuspid valve regurgitation is trivial. No evidence of tricuspid stenosis. Aortic Valve: The aortic valve is normal in structure. Aortic valve regurgitation is not visualized. No aortic stenosis is present. Aortic valve mean gradient measures 3.0 mmHg. Aortic valve peak gradient measures 5.9 mmHg. Aortic valve area, by VTI measures 2.40 cm. Pulmonic Valve: The pulmonic valve was normal in structure. Pulmonic valve regurgitation is trivial. No evidence of pulmonic stenosis. Aorta: The aortic root is  normal in size and structure. Venous: The inferior vena cava is normal in size with greater than 50% respiratory  variability, suggesting right atrial pressure of 3 mmHg. IAS/Shunts: No atrial level shunt detected by color flow Doppler. Agitated saline contrast was given intravenously to evaluate for intracardiac shunting. Agitated saline contrast bubble study was negative, with no evidence of any interatrial shunt.  LEFT VENTRICLE PLAX 2D LVIDd:         4.40 cm     Diastology LVIDs:         2.70 cm     LV e' medial:    8.70 cm/s LV PW:         1.10 cm     LV E/e' medial:  8.6 LV IVS:        1.10 cm     LV e' lateral:   9.57 cm/s LVOT diam:     2.10 cm     LV E/e' lateral: 7.8 LV SV:         64 LV SV Index:   30 LVOT Area:     3.46 cm  LV Volumes (MOD) LV vol d, MOD A2C: 51.7 ml LV vol d, MOD A4C: 60.7 ml LV vol s, MOD A2C: 30.3 ml LV vol s, MOD A4C: 31.3 ml LV SV MOD A2C:     21.4 ml LV SV MOD A4C:     60.7 ml LV SV MOD BP:      27.0 ml RIGHT VENTRICLE RV Basal diam:  3.45 cm RV Mid diam:    3.30 cm RV S prime:     17.70 cm/s TAPSE (M-mode): 2.1 cm LEFT ATRIUM             Index        RIGHT ATRIUM           Index LA diam:        3.30 cm 1.55 cm/m   RA Area:     15.90 cm LA Vol (A2C):   50.9 ml 23.91 ml/m  RA Volume:   42.10 ml  19.78 ml/m LA Vol (A4C):   21.4 ml 10.05 ml/m LA Biplane Vol: 34.2 ml 16.07 ml/m  AORTIC VALVE                    PULMONIC VALVE AV Area (Vmax):    2.10 cm     PV Vmax:       0.71 m/s AV Area (Vmean):   2.16 cm     PV Peak grad:  2.0 mmHg AV Area (VTI):     2.40 cm AV Vmax:           121.00 cm/s AV Vmean:          73.800 cm/s AV VTI:            0.265 m AV Peak Grad:      5.9 mmHg AV Mean Grad:      3.0 mmHg LVOT Vmax:         73.40 cm/s LVOT Vmean:        46.000 cm/s LVOT VTI:          0.184 m LVOT/AV VTI ratio: 0.69  AORTA Ao Root diam: 3.60 cm MITRAL VALVE               TRICUSPID VALVE MV Area (PHT): 2.87 cm    TR Peak grad:   23.6 mmHg MV Area VTI:   2.54 cm  TR Vmax:        243.00 cm/s MV Peak grad:  2.9 mmHg MV Mean grad:  1.0 mmHg    SHUNTS MV Vmax:       0.85 m/s    Systemic VTI:   0.18 m MV Vmean:      43.8 cm/s   Systemic Diam: 2.10 cm MV Decel Time: 264 msec MV E velocity: 74.40 cm/s MV A velocity: 73.90 cm/s MV E/A ratio:  1.01 Lorine Bears MD Electronically signed by Lorine Bears MD Signature Date/Time: 09/27/2023/3:45:28 PM    Final         Scheduled Meds:  apixaban  10 mg Oral BID   Followed by   Melene Muller ON 10/05/2023] apixaban  5 mg Oral BID   insulin aspart  0-6 Units Subcutaneous TID WC   levothyroxine  125 mcg Oral Q0600   sodium chloride flush  3 mL Intravenous Q12H   spironolactone  25 mg Oral Daily   Continuous Infusions:   LOS: 4 days    Tresa Moore, MD [facility]   If 7PM-7AM, please contact night-coverage  09/29/2023, 2:38 PM

## 2023-09-29 NOTE — Progress Notes (Signed)
Progress Note  Patient Name: Daniel Kirby Date of Encounter: 09/29/2023  Primary Cardiologist: New - consult by Kirke Corin  Subjective   No chest pain, dyspnea, palpitations, dizziness, presyncope, or syncope. No further runs of polymorphic VT. Echo 11/11 showed preserved LVSF with normal wall motion. Potassium improving to 3.5 today. Magnesium 1.9. Telemetry with sinus bradycardia in the 50s bpm with a rare isolated PVC.  Inpatient Medications    Scheduled Meds:  apixaban  10 mg Oral BID   Followed by   Melene Muller ON 10/05/2023] apixaban  5 mg Oral BID   insulin aspart  0-6 Units Subcutaneous TID WC   levothyroxine  125 mcg Oral Q0600   potassium chloride  20 mEq Oral Once   sodium chloride flush  3 mL Intravenous Q12H   spironolactone  25 mg Oral Daily   Continuous Infusions:   PRN Meds: hydrALAZINE   Vital Signs    Vitals:   09/29/23 0500 09/29/23 0825 09/29/23 0828 09/29/23 0830  BP:  133/69 (!) 145/66 (!) 145/85  Pulse:  (!) 52 (!) 58 77  Resp:  20    Temp:  98.6 F (37 C)    TempSrc:  Oral    SpO2:  95% 98% 100%  Weight: 98.7 kg     Height:        Intake/Output Summary (Last 24 hours) at 09/29/2023 0919 Last data filed at 09/29/2023 0320 Gross per 24 hour  Intake 240 ml  Output 1550 ml  Net -1310 ml   Filed Weights   09/26/23 0041 09/29/23 0500  Weight: 102.3 kg 98.7 kg    Telemetry    Sinus bradycardia with rare isolated PVC, no NSVT or polymorphic VT - Personally Reviewed  ECG    No new tracings - Personally Reviewed  Physical Exam   GEN: No acute distress.   Neck: No JVD. Cardiac: RRR, no murmurs, rubs, or gallops.  Respiratory: Clear to auscultation bilaterally.  GI: Soft, nontender, non-distended.   MS: No edema; No deformity. Neuro:  Alert and oriented x 3; Nonfocal.  Psych: Normal affect.  Labs    Chemistry Recent Labs  Lab 09/25/23 2207 09/26/23 0320 09/26/23 1300 09/27/23 0719 09/27/23 1623 09/28/23 0356 09/29/23 0449   NA  --  136   < > 136  --  135 138  K  --  2.6*   < > 2.5* 3.1* 3.0* 3.5  CL  --  99   < > 97*  --  102 101  CO2  --  29   < > 30  --  27 26  GLUCOSE  --  145*   < > 117*  --  148* 128*  BUN  --  12   < > 10  --  8 9  CREATININE  --  1.12   < > 0.94  --  0.98 0.90  CALCIUM  --  7.9*   < > 8.0*  --  7.6* 7.8*  PROT 6.8 6.4*  --   --   --   --   --   ALBUMIN 3.5 3.3*  --   --   --   --   --   AST 36 26  --   --   --   --   --   ALT 28 22  --   --   --   --   --   ALKPHOS 51 47  --   --   --   --   --  BILITOT 0.7 0.9  --   --   --   --   --   GFRNONAA  --  >60   < > >60  --  >60 >60  ANIONGAP  --  8   < > 9  --  6 11   < > = values in this interval not displayed.     Hematology Recent Labs  Lab 09/27/23 0719 09/28/23 0356 09/29/23 0449  WBC 7.5 6.7 6.6  RBC 3.62* 3.63* 3.63*  HGB 11.1* 11.2* 11.0*  HCT 31.7* 32.2* 32.3*  MCV 87.6 88.7 89.0  MCH 30.7 30.9 30.3  MCHC 35.0 34.8 34.1  RDW 13.6 13.2 13.3  PLT 296 276 270    Cardiac EnzymesNo results for input(s): "TROPONINI" in the last 168 hours. No results for input(s): "TROPIPOC" in the last 168 hours.   BNPNo results for input(s): "BNP", "PROBNP" in the last 168 hours.   DDimer  Recent Labs  Lab 09/25/23 2207  DDIMER 9.18*     Radiology    US Venous Img Lower Bilateral (DVT)  Result Date: 09/26/2023 IMPRESSION: 1. Occlusive thrombus in the inferior left popliteal vein in this patient with known DVT by CTA chest yesterday. 2. No evidence for DVT in the right lower extremity. Electronically Signed   By: Kennith Center M.D.   On: 09/26/2023 13:12    Cardiac Studies   2D echo 09/27/2023: 1. Left ventricular ejection fraction, by estimation, is 55 to 60%. The  left ventricle has normal function. The left ventricle has no regional  wall motion abnormalities. There is mild asymmetric left ventricular  hypertrophy of the basal-septal segment.  Left ventricular diastolic parameters were normal.   2. Right  ventricular systolic function is normal. The right ventricular  size is normal. There is normal pulmonary artery systolic pressure.   3. The mitral valve is normal in structure. No evidence of mitral valve  regurgitation. No evidence of mitral stenosis.   4. The aortic valve is normal in structure. Aortic valve regurgitation is  not visualized. No aortic stenosis is present.   5. The inferior vena cava is normal in size with greater than 50%  respiratory variability, suggesting right atrial pressure of 3 mmHg.   6. Agitated saline contrast bubble study was negative, with no evidence  of any interatrial shunt.   Patient Profile     77 y.o. male with history of DM2, HTN, and untreated hypothyroidism who was admitted with bilateral PE and we are seeing for evaluation of polymorphic VT.   Assessment & Plan    1. Polymorphic VT and frequent PVCs: -Improved PVC burden with no further episodes of polymorphic VT -Likely in the setting of prolonged QT with possible U waves on EKG secondary to severe hypokalemia with severe hypothyroidism  -Echo with preserved LVSF and normal wall motion  -Continue to replete potassium to goal 4.0 -Magnesium normal -Would plan for ischemic evaluation, ideally in the outpatient setting given acute PE, hypothyroidism, and electrolyte abnormalities, and in the context of preserved LVSF, normal wall motion, and lack of angina -Underlying bradycardia precludes addition of beta blocker at this time  2. Hypothyroidism: -Consider endocrinology consultation to see if IV levothyroxine or concomitant IV steroids are needed considering his systemic manifestations with bradycardia, mild rhabdomyolysis and severe electrolyte abnormalities  -On replacement therapy  -Per IM  3. Severe hypokalemia: -Improving -Continue recently started spironolactone 25 mg daily -Being repleted via IV -Magnesium normal -Continue to hold HCTZ  4. Pulmonary  embolism: -Currently on heparin  gtt -No evidence of right heart strain on echo    For questions or updates, please contact [facility] Please consult [email or web address] for contact info under Cardiology/STEMI.    Signed, Eula Listen, PA-C [facility] Pager: [phone] 09/29/2023, 9:19 AM

## 2023-09-29 NOTE — Consult Note (Signed)
PHARMACY CONSULT NOTE - ELECTROLYTES  Pharmacy Consult for Electrolyte Monitoring and Replacement   Recent Labs: Height: 5\' 7"  (170.2 cm) Weight: 98.7 kg (217 lb 9.5 oz) IBW/kg (Calculated) : 66.1 Estimated Creatinine Clearance: 76.9 mL/min (by C-G formula based on SCr of 0.9 mg/dL). Potassium (mmol/L)  Date Value  09/29/2023 3.5   Magnesium (mg/dL)  Date Value  30/86/5784 1.9   Calcium (mg/dL)  Date Value  69/62/9528 7.8 (L)   Albumin (g/dL)  Date Value  41/32/4401 3.3 (L)  01/14/2023 4.5   Phosphorus (mg/dL)  Date Value  02/72/5366 2.6   Sodium (mmol/L)  Date Value  09/29/2023 138  01/14/2023 135   Corrected Ca: 8.5 mg/dL  Assessment  Daniel Kirby is a 77 y.o. male presenting with lightheadedness and syncope. PMH significant for PE, T2DM, hypothyroidism. Pharmacy has been consulted to monitor and replace electrolytes.  Diet: Carb Modified; Thin MIVF:IV lock Pertinent medications: spironolactone  Goal of Therapy: Electrolytes WNL  Plan:  K 3.5 today. Need to optimize K to 4 per cardiology recommendations. Will give Kcl 20 meq po x 1 dose today. Spironolactone started yesterday 11/2 with 1 dose received so far Follow AM labs and replace as needed.  Thank you for allowing pharmacy to be a part of this patient's care.  Daniel Kirby PharmD, BCPS 09/29/2023 9:58 AM

## 2023-09-29 NOTE — Care Management Important Message (Signed)
Important Message  Patient Details  Name: Daniel Kirby MRN: [id number] Date of Birth: 1946-03-29   Important Message Given:  Yes - Medicare IM     [name] [name] 09/29/2023, 2:37 PM

## 2023-09-30 ENCOUNTER — Ambulatory Visit: Payer: Medicare PPO | Admitting: Family Medicine

## 2023-09-30 DIAGNOSIS — I2699 Other pulmonary embolism without acute cor pulmonale: Secondary | ICD-10-CM | POA: Diagnosis not present

## 2023-09-30 LAB — BASIC METABOLIC PANEL
Anion gap: 9 (ref 5–15)
BUN: 11 mg/dL (ref 8–23)
CO2: 24 mmol/L (ref 22–32)
Calcium: 7.8 mg/dL — ABNORMAL LOW (ref 8.9–10.3)
Chloride: 103 mmol/L (ref 98–111)
Creatinine, Ser: 0.85 mg/dL (ref 0.61–1.24)
GFR, Estimated: >60 mL/min (ref >60)
Glucose, Bld: 134 mg/dL — ABNORMAL HIGH (ref 70–99)
Potassium: 3.7 mmol/L (ref 3.5–5.1)
Sodium: 136 mmol/L (ref 135–145)

## 2023-09-30 LAB — CBC
HCT: 29.8 % — ABNORMAL LOW (ref 39.0–52.0)
Hemoglobin: 10.4 g/dL — ABNORMAL LOW (ref 13.0–17.0)
MCH: 31.9 pg (ref 26.0–34.0)
MCHC: 34.9 g/dL (ref 30.0–36.0)
MCV: 91.4 fL (ref 80.0–100.0)
Platelets: 204 10*3/uL (ref 150–400)
RBC: 3.26 MIL/uL — ABNORMAL LOW (ref 4.22–5.81)
RDW: 13.3 % (ref 11.5–15.5)
WBC: 6 10*3/uL (ref 4.0–10.5)
nRBC: 0 % (ref 0.0–0.2)

## 2023-09-30 LAB — MAGNESIUM: Magnesium: 1.9 mg/dL (ref 1.7–2.4)

## 2023-09-30 LAB — GLUCOSE, CAPILLARY
Glucose-Capillary: 143 mg/dL — ABNORMAL HIGH (ref 70–99)
Glucose-Capillary: 138 mg/dL — ABNORMAL HIGH (ref 70–99)
Glucose-Capillary: 161 mg/dL — ABNORMAL HIGH (ref 70–99)
Glucose-Capillary: 141 mg/dL — ABNORMAL HIGH (ref 70–99)
Glucose-Capillary: 142 mg/dL — ABNORMAL HIGH (ref 70–99)

## 2023-09-30 MED ORDER — POTASSIUM CHLORIDE CRYS ER 20 MEQ PO TBCR
20.0000 meq | EXTENDED_RELEASE_TABLET | Freq: Once | ORAL | Status: AC
  Administered 2023-09-30: 20 meq via ORAL
  Filled 2023-09-30: qty 1

## 2023-09-30 MED ORDER — MIRABEGRON ER 50 MG PO TB24
50.0000 mg | ORAL_TABLET | Freq: Every day | ORAL | Status: DC
  Administered 2023-09-30 – 2023-10-01 (×2): 50 mg via ORAL
  Filled 2023-09-30 (×2): qty 1

## 2023-09-30 NOTE — TOC Progression Note (Addendum)
Transition of Care [facility]) - Progression Note    Patient Details  Name: Daniel Kirby MRN: [id number] Date of Birth: 1946-08-08  [facility]) CM/SW Contact  Darolyn Rua, [place] Phone Number: 09/30/2023, 1:22 PM  Clinical Narrative:      Update 1:55 pm: Daniel Kirby reports not having a place for patient to stay, daughter Daniel Kirby called CSW said she spoke with patient he's agreeable to snf preference Daniel Kirby, CSW confirmed with patient. CSW has faxed [place] patient's social security card to be abel to get pasrr number. Daniel Kirby admissions informed and insurance auth started.        Per PT patient has improved today and requesting home with [facility], believes he can stay with his sister Daniel Kirby.   CSW spoke with patient he reports he has not spoke with Daniel Kirby about his plan but believes he can stay with her. Reports no other family would be able to assist, reports his son or brother will pick up at discharge.   CSW has lvm with Daniel Kirby to confirm discharge plan pending call back. Patient is also going to try to get ahold of Daniel Kirby.   RW and 3in1 ordered via [employer] for bedside delivery.   Patient is set up with [facility] for PT and OT   Expected Discharge Plan: Skilled Nursing Facility Barriers to Discharge: Continued Medical Work up  Expected Discharge Plan and Services       Living arrangements for the past 2 months: Single Family Home                                       Social Determinants of Health (SDOH) Interventions SDOH Screenings   Food Insecurity: Food Insecurity Present (11/23/2022)  Housing: Low Risk  (11/23/2022)  Transportation Needs: No Transportation Needs (11/23/2022)  Utilities: Not At Risk (11/23/2022)  Alcohol Screen: Low Risk  (11/05/2021)  Depression (PHQ2-9): Low Risk  (05/14/2023)  Financial Resource Strain: Low Risk  (11/23/2022)  Physical Activity: Inactive (11/23/2022)  Social Connections: Socially Isolated (11/23/2022)  Stress: No Stress  Concern Present (11/23/2022)  Tobacco Use: High Risk (09/25/2023)    Readmission Risk Interventions     No data to display

## 2023-09-30 NOTE — Progress Notes (Signed)
Patient is not able to walk the distance required to go the bathroom, or he/she is unable to safely negotiate stairs required to access the bathroom.  A 3in1 BSC will alleviate this problem  Wanda Cellucci Holcomb, LCSW, MSW, MHA [phone]  

## 2023-09-30 NOTE — Progress Notes (Signed)
PROGRESS NOTE    Daniel Kirby  WUJ:[id number] DOB: 01-20-1946 DOA: 09/25/2023 PCP: Duanne Limerick, MD    Brief Narrative:   Presented with several weeks weakness and lightneadedness and syncope with ambulation and difficulty getting up from the ground and lower extremity swelling, found to have bilateral PE    Assessment & Plan:   Principal Problem:   Acute pulmonary embolism (HCC) Active Problems:   Syncope and collapse   Essential hypertension   Adult hypothyroidism   Type 2 diabetes mellitus without complication, without long-term current use of insulin (HCC)   Weakness   Dizziness   Electrolyte abnormality   Obesity (BMI 30-39.9)   DVT (deep venous thrombosis) (HCC)   Ventricular tachycardia (HCC)   Hypokalemia  # Acute pulmonary embolism # DVT B/l, no evidence heart strain, also left popliteal DVT.  Hemodynamically stable. TTE unremarkable -Continue apixaban   # Debility -Therapy advising SNF however patient is reluctant.  Will order home health PT and OT and case decision made to go home.  If patient is unable to go home her family unwilling to care for him we will need to get bed choices and starts SNF search   # History prostate cancer PSA undetectable   # T2DM A1c in the 7s, euglycemic here - hold home orals - SSI   # Hypokalemia # Hypomagnesemia Denies vomiting/diarrhea, is on hydrochlorothiazide at home, also hypothyroid - replete as needed   # Hypothyroid Uncontrolled with elevated tsh and low t4, patient reports noncompliance w/ home synthroid. No obtundation, hypothermia, hyponatremia, hypotension to suggest myxedema coma, discussed w/ ICU attending today and he agrees. - resume home synthroid, repeat TFTs 4-6 wks   # Adrenal nodule, incidental - outpt f/u   # Elevated CK Most likely 2/2 acute dvt/pe, did have recent falls as well with inability to get up. CK stable 1-2k, no aki. No muscle pain or tenderness. -Under 3000.  No need to trend    # HTN  Here normotensive - home losartan/hdtz on hold   # History prior strokes Seen on MRI, nothing acute - LDL is 130 so needs higher intensity statin but for now statin on hold 2/2 elevated Ck   # Obesity noted      DVT prophylaxis: Apixaban Code Status: Full Family Communication: None Disposition Plan: Status is: Inpatient Remains inpatient appropriate because: Unsafe discharge plan.  Pending skilled nursing facility bed.   Level of care: Progressive  Consultants:  Cardiology  Procedures:  None  Antimicrobials: None   Subjective: Seen and examined.  Lying in bed.  No visible distress  Objective: Vitals:   09/30/23 0905 09/30/23 0908 09/30/23 0913 09/30/23 1158  BP: (!) 147/77 (!) 132/93 (!) 152/92 (!) 154/69  Pulse: 64 83 78 (!) 52  Resp:    20  Temp:    99 F (37.2 C)  TempSrc:    Oral  SpO2: 94% 92% 96% 98%  Weight:      Height:        Intake/Output Summary (Last 24 hours) at 09/30/2023 1331 Last data filed at 09/30/2023 1200 Gross per 24 hour  Intake 956 ml  Output 3000 ml  Net -2044 ml   Filed Weights   09/26/23 0041 09/29/23 0500  Weight: 102.3 kg 98.7 kg    Examination:  General exam: NAD Respiratory system: Lungs clear.  Normal work of breathing.  Room air Cardiovascular system: S1-2, RRR, no murmurs, no pedal edema Gastrointestinal system: Soft, NT/ND, normal bowel sounds  Central nervous system: Alert and oriented. No focal neurological deficits. Extremities: Symmetric 5 x 5 power. Skin: No rashes, lesions or ulcers Psychiatry: Judgement and insight appear normal. Mood & affect appropriate.     Data Reviewed: I have personally reviewed following labs and imaging studies  CBC: Recent Labs  Lab 09/25/23 1738 09/27/23 0719 09/28/23 0356 09/29/23 0449 09/30/23 0541  WBC 9.8 7.5 6.7 6.6 6.0  HGB 13.0 11.1* 11.2* 11.0* 10.4*  HCT 37.0* 31.7* 32.2* 32.3* 29.8*  MCV 87.3 87.6 88.7 89.0 91.4  PLT 330 296 276 270 204    Basic Metabolic Panel: Recent Labs  Lab 09/26/23 2043 09/27/23 0719 09/27/23 1623 09/28/23 0356 09/29/23 0449 09/30/23 0541  NA 135 136  --  135 138 136  K 2.6* 2.5* 3.1* 3.0* 3.5 3.7  CL 96* 97*  --  102 101 103  CO2 29 30  --  27 26 24   GLUCOSE 134* 117*  --  148* 128* 134*  BUN 9 10  --  8 9 11   CREATININE 1.14 0.94  --  0.98 0.90 0.85  CALCIUM 8.1* 8.0*  --  7.6* 7.8* 7.8*  MG 2.1 2.0  --  2.0 1.9 1.9  PHOS  --  2.6  --   --   --   --    GFR: Estimated Creatinine Clearance: 81.4 mL/min (by C-G formula based on SCr of 0.85 mg/dL). Liver Function Tests: Recent Labs  Lab 09/25/23 2207 09/26/23 0320  AST 36 26  ALT 28 22  ALKPHOS 51 47  BILITOT 0.7 0.9  PROT 6.8 6.4*  ALBUMIN 3.5 3.3*   No results for input(s): "LIPASE", "AMYLASE" in the last 168 hours. No results for input(s): "AMMONIA" in the last 168 hours. Coagulation Profile: Recent Labs  Lab 09/25/23 2207  INR 1.0   Cardiac Enzymes: Recent Labs  Lab 09/25/23 2207 09/26/23 0319 09/27/23 0719 09/28/23 0356 09/29/23 0449  CKTOTAL 1,431* 1,151* 1,780* 2,004* 2,129*   BNP (last 3 results) No results for input(s): "PROBNP" in the last 8760 hours. HbA1C: No results for input(s): "HGBA1C" in the last 72 hours. CBG: Recent Labs  Lab 09/29/23 1509 09/29/23 2028 09/30/23 0405 09/30/23 0859 09/30/23 1155  GLUCAP 168* 187* 143* 138* 161*   Lipid Profile: No results for input(s): "CHOL", "HDL", "LDLCALC", "TRIG", "CHOLHDL", "LDLDIRECT" in the last 72 hours.  Thyroid Function Tests: No results for input(s): "TSH", "T4TOTAL", "FREET4", "T3FREE", "THYROIDAB" in the last 72 hours. Anemia Panel: No results for input(s): "VITAMINB12", "FOLATE", "FERRITIN", "TIBC", "IRON", "RETICCTPCT" in the last 72 hours. Sepsis Labs: Recent Labs  Lab 09/25/23 2207 09/26/23 0055 09/27/23 0719  PROCALCITON <0.10  --   --   LATICACIDVEN 3.4* 2.9* 1.8    No results found for this or any previous visit (from the  past 240 hour(s)).       Radiology Studies: No results found.      Scheduled Meds:  apixaban  10 mg Oral BID   Followed by   Melene Muller ON 10/05/2023] apixaban  5 mg Oral BID   insulin aspart  0-6 Units Subcutaneous TID WC   levothyroxine  125 mcg Oral Q0600   mirabegron ER  50 mg Oral Daily   sodium chloride flush  3 mL Intravenous Q12H   spironolactone  25 mg Oral Daily   Continuous Infusions:   LOS: 5 days    Tresa Moore, MD [facility]   If 7PM-7AM, please contact night-coverage  09/30/2023, 1:31 PM

## 2023-09-30 NOTE — Progress Notes (Signed)
Physical Therapy Treatment Patient Details Name: Daniel Kirby MRN: [id number] DOB: Feb 23, 1946 Today's Date: 09/30/2023   History of Present Illness Pt is 77 y/o admitted 09/25/23 for Acute Pulmonary Embolism. Pt has been experiencing low potassium levels. PmHx includes: Essential HTN, DVT, generalized weakness, and syncope/collapse episodes.    PT Comments  Patient is agreeable to PT session. He was able to ambulate a short distance in the room with CGA using rolling walker. Heavy reliance on the walker while standing and limited standing activity tolerance. No shortness of breath noted, however patient fatigued with minimal activity. Sp02 97% on room air after walking. I have asked mobility team to work with the patient for more mobility opportunities. The patient reports he may be able to go to his sister's home where he could have assistance if needed at discharge. Recommend to continue PT to maximize independence and facilitate return to prior level of function. Consider rehabilitation <3 hours/day if patient is willing.     If plan is discharge home, recommend the following: A little help with walking and/or transfers;A little help with bathing/dressing/bathroom;Assist for transportation;Help with stairs or ramp for entrance   Can travel by private vehicle     Yes  Equipment Recommendations  Rolling walker (2 wheels)    Recommendations for Other Services       Precautions / Restrictions Precautions Precautions: Fall Restrictions Weight Bearing Restrictions: No     Mobility  Bed Mobility               General bed mobility comments: not assessed as patient sitting up on arrival and post session    Transfers Overall transfer level: Needs assistance Equipment used: Rolling walker (2 wheels) Transfers: Sit to/from Stand Sit to Stand: Contact guard assist           General transfer comment: no physical lifting assistance required for standing     Ambulation/Gait Ambulation/Gait assistance: Contact guard assist Gait Distance (Feet): 15 Feet Assistive device: Rolling walker (2 wheels) Gait Pattern/deviations: Step-through pattern Gait velocity: decreased     General Gait Details: patient relying heavily on rolling walker for support. reinforced importance to use rolling walker rather than the cane for support at this time for safety and fall prevention. no shortness of breath with activity but low endurance overall. Sp02 97% on room air after walking   Stairs             Wheelchair Mobility     Tilt Bed    Modified Rankin (Stroke Patients Only)       Balance Overall balance assessment: Needs assistance Sitting-balance support: Feet supported, Single extremity supported Sitting balance-Leahy Scale: Good     Standing balance support: Bilateral upper extremity supported, During functional activity Standing balance-Leahy Scale: Poor Standing balance comment: heavy relinace on rolling walker for support                            Cognition Arousal: Alert Behavior During Therapy: WFL for tasks assessed/performed Overall Cognitive Status: Within Functional Limits for tasks assessed                                          Exercises      General Comments        Pertinent Vitals/Pain Pain Assessment Pain Assessment: No/denies pain    Home Living  Prior Function            PT Goals (current goals can now be found in the care plan section) Acute Rehab PT Goals Patient Stated Goal: To get better PT Goal Formulation: With patient Time For Goal Achievement: 10/11/23 Potential to Achieve Goals: Good Progress towards PT goals: Progressing toward goals    Frequency    Min 1X/week      PT Plan      Co-evaluation              AM-PAC PT "6 Clicks" Mobility   Outcome Measure  Help needed turning from your back to your side  while in a flat bed without using bedrails?: A Little Help needed moving from lying on your back to sitting on the side of a flat bed without using bedrails?: A Little Help needed moving to and from a bed to a chair (including a wheelchair)?: A Little Help needed standing up from a chair using your arms (e.g., wheelchair or bedside chair)?: A Little Help needed to walk in hospital room?: A Little Help needed climbing 3-5 steps with a railing? : Total 6 Click Score: 16    End of Session   Activity Tolerance: Patient limited by fatigue;Patient tolerated treatment well Patient left: in chair Nurse Communication: Mobility status PT Visit Diagnosis: Unsteadiness on feet (R26.81);Other abnormalities of gait and mobility (R26.89);Muscle weakness (generalized) (M62.81);Difficulty in walking, not elsewhere classified (R26.2);Pain     Time: 1610-9604 PT Time Calculation (min) (ACUTE ONLY): 21 min  Charges:    $Therapeutic Activity: 8-22 mins PT General Charges $$ ACUTE PT VISIT: 1 Visit                     Daniel Kirby, PT, MPT    Daniel Kirby 09/30/2023, 12:43 PM

## 2023-09-30 NOTE — Plan of Care (Signed)
  Problem: Education: Goal: Knowledge of condition and prescribed therapy will improve Outcome: Progressing   Problem: Clinical Measurements: Goal: Will remain free from infection Outcome: Progressing   Problem: Clinical Measurements: Goal: Respiratory complications will improve Outcome: Progressing   Problem: Clinical Measurements: Goal: Cardiovascular complication will be avoided Outcome: Progressing   Problem: Activity: Goal: Risk for activity intolerance will decrease Outcome: Progressing   Problem: Pain Management: Goal: General experience of comfort will improve Outcome: Progressing   Problem: Safety: Goal: Ability to remain free from injury will improve Outcome: Progressing

## 2023-09-30 NOTE — Consult Note (Signed)
PHARMACY CONSULT NOTE - ELECTROLYTES  Pharmacy Consult for Electrolyte Monitoring and Replacement   Recent Labs: Height: 5\' 7"  (170.2 cm) Weight: 98.7 kg (217 lb 9.5 oz) IBW/kg (Calculated) : 66.1 Estimated Creatinine Clearance: 81.4 mL/min (by C-G formula based on SCr of 0.85 mg/dL). Potassium (mmol/L)  Date Value  09/30/2023 3.7   Magnesium (mg/dL)  Date Value  16/08/9603 1.9   Calcium (mg/dL)  Date Value  54/07/8118 7.8 (L)   Albumin (g/dL)  Date Value  14/78/2956 3.3 (L)  01/14/2023 4.5   Phosphorus (mg/dL)  Date Value  21/30/8657 2.6   Sodium (mmol/L)  Date Value  09/30/2023 136  01/14/2023 135   Corrected Ca: 8.5 mg/dL  Assessment  Daniel Kirby is a 77 y.o. male presenting with lightheadedness and syncope. PMH significant for PE, T2DM, hypothyroidism. Pharmacy has been consulted to monitor and replace electrolytes.  Diet:  Carb Modified; Thin MIVF:  IV lock Pertinent medications:  spironolactone  Goal of Therapy: Electrolytes WNL Goal K > 4.0 Goal Mg > 2.0  Plan:  K 3.7 today. Need to optimize K to 4 per cardiology recommendations. Will give Kcl 20 meq po x 1 dose today. Spironolactone started yesterday 11/2 with 1 dose received so far Follow AM labs and replace as needed.  Thank you for allowing pharmacy to be a part of this patient's care.  Effie Shy, PharmD Pharmacy Resident  09/30/2023 7:05 AM

## 2023-10-01 DIAGNOSIS — M6259 Muscle wasting and atrophy, not elsewhere classified, multiple sites: Secondary | ICD-10-CM | POA: Diagnosis not present

## 2023-10-01 DIAGNOSIS — R29898 Other symptoms and signs involving the musculoskeletal system: Secondary | ICD-10-CM | POA: Diagnosis not present

## 2023-10-01 DIAGNOSIS — E785 Hyperlipidemia, unspecified: Secondary | ICD-10-CM | POA: Diagnosis not present

## 2023-10-01 DIAGNOSIS — C61 Malignant neoplasm of prostate: Secondary | ICD-10-CM | POA: Diagnosis not present

## 2023-10-01 DIAGNOSIS — M6281 Muscle weakness (generalized): Secondary | ICD-10-CM | POA: Diagnosis not present

## 2023-10-01 DIAGNOSIS — I2699 Other pulmonary embolism without acute cor pulmonale: Secondary | ICD-10-CM | POA: Diagnosis not present

## 2023-10-01 DIAGNOSIS — E119 Type 2 diabetes mellitus without complications: Secondary | ICD-10-CM | POA: Diagnosis not present

## 2023-10-01 DIAGNOSIS — I10 Essential (primary) hypertension: Secondary | ICD-10-CM | POA: Diagnosis not present

## 2023-10-01 DIAGNOSIS — R531 Weakness: Secondary | ICD-10-CM | POA: Diagnosis not present

## 2023-10-01 DIAGNOSIS — I82432 Acute embolism and thrombosis of left popliteal vein: Secondary | ICD-10-CM | POA: Diagnosis not present

## 2023-10-01 DIAGNOSIS — Z7401 Bed confinement status: Secondary | ICD-10-CM | POA: Diagnosis not present

## 2023-10-01 DIAGNOSIS — R2681 Unsteadiness on feet: Secondary | ICD-10-CM | POA: Diagnosis not present

## 2023-10-01 DIAGNOSIS — I2609 Other pulmonary embolism with acute cor pulmonale: Secondary | ICD-10-CM | POA: Diagnosis not present

## 2023-10-01 DIAGNOSIS — Z741 Need for assistance with personal care: Secondary | ICD-10-CM | POA: Diagnosis not present

## 2023-10-01 DIAGNOSIS — E039 Hypothyroidism, unspecified: Secondary | ICD-10-CM | POA: Diagnosis not present

## 2023-10-01 LAB — GLUCOSE, CAPILLARY
Glucose-Capillary: 139 mg/dL — ABNORMAL HIGH (ref 70–99)
Glucose-Capillary: 165 mg/dL — ABNORMAL HIGH (ref 70–99)

## 2023-10-01 MED ORDER — BISACODYL 10 MG RE SUPP: 10.0000 mg | Freq: Every day | RECTAL | Status: DC | PRN

## 2023-10-01 MED ORDER — POLYETHYLENE GLYCOL 3350 17 G PO PACK
17.0000 g | PACK | Freq: Every day | ORAL | Status: DC
  Filled 2023-10-01: qty 1

## 2023-10-01 MED ORDER — SENNOSIDES-DOCUSATE SODIUM 8.6-50 MG PO TABS
1.0000 | ORAL_TABLET | Freq: Two times a day (BID) | ORAL | Status: DC
  Filled 2023-10-01: qty 1

## 2023-10-01 MED ORDER — APIXABAN 5 MG PO TABS: ORAL_TABLET | ORAL | Status: DC

## 2023-10-01 MED ORDER — SPIRONOLACTONE 25 MG PO TABS: 25.0000 mg | ORAL_TABLET | Freq: Every day | ORAL | Status: AC

## 2023-10-01 NOTE — Discharge Summary (Signed)
Physician Discharge Summary  Daniel Kirby:[id number] DOB: Aug 23, 1946 DOA: 09/25/2023  PCP: Duanne Limerick, MD  Admit date: 09/25/2023 Discharge date: 10/01/2023  Admitted From: Home Disposition:  SNF  Recommendations for Outpatient Follow-up:  Follow up with PCP in 1-2 weeks Follow up with cardiology 1-2 weeks  Home Health:No  Equipment/Devices:None   Discharge Condition:Stable  CODE STATUS:FULL  Diet recommendation: Heart/carb  Brief/Interim Summary:  Presented with several weeks weakness and lightneadedness and syncope with ambulation and difficulty getting up from the ground and lower extremity swelling, found to have bilateral PE     Discharge Diagnoses:  Principal Problem:   Acute pulmonary embolism (HCC) Active Problems:   Syncope and collapse   Essential hypertension   Adult hypothyroidism   Type 2 diabetes mellitus without complication, without long-term current use of insulin (HCC)   Weakness   Dizziness   Electrolyte abnormality   Obesity (BMI 30-39.9)   DVT (deep venous thrombosis) (HCC)   Ventricular tachycardia (HCC)   Hypokalemia # Acute pulmonary embolism # DVT B/l, no evidence heart strain, also left popliteal DVT.  Hemodynamically stable. TTE unremarkable -Continue apixaban 10 mg p.o. twice daily x 7 days followed by 5 mg p.o. twice daily for at least 3 months.   # Debility -Therapy advising SNF however patient is reluctant.  After discussion with TOC and patient's family decision made to proceed with skilled nursing facility placement.     Discharge Instructions  Discharge Instructions     Diet - low sodium heart healthy   Complete by: As directed    Increase activity slowly   Complete by: As directed       Allergies as of 10/01/2023   No Known Allergies      Medication List     STOP taking these medications    losartan-hydrochlorothiazide 100-25 MG tablet Commonly known as: HYZAAR   potassium chloride 10 MEQ  tablet Commonly known as: KLOR-CON M   pravastatin 10 MG tablet Commonly known as: PRAVACHOL       TAKE these medications    apixaban 5 MG Tabs tablet Commonly known as: ELIQUIS Take 2 tablets (10 mg total) by mouth 2 (two) times daily for 5 days, THEN 1 tablet (5 mg total) 2 (two) times daily. Start taking on: October 01, 2023   glimepiride 2 MG tablet Commonly known as: AMARYL One a day   levothyroxine 125 MCG tablet Commonly known as: SYNTHROID Take 1 tablet by mouth once daily   metFORMIN 500 MG tablet Commonly known as: GLUCOPHAGE Take 1 tablet (500 mg total) by mouth 2 (two) times daily with a meal.   mirabegron ER 50 MG Tb24 tablet Commonly known as: MYRBETRIQ Take 1 tablet (50 mg total) by mouth daily.   NONFORMULARY OR COMPOUNDED ITEM Trimix (30/1/10)-(Pap/Phent/PGE)  Dosage: Inject 0.5 cc per injection   Vial 1ml  Qty #10 Refills 6  [facility] [phone] Fax [phone]   spironolactone 25 MG tablet Commonly known as: ALDACTONE Take 1 tablet (25 mg total) by mouth daily. Start taking on: October 02, 2023               Durable Medical Equipment  (From admission, onward)           Start     Ordered   09/30/23 1329  For home use only DME 3 n 1  Once        09/30/23 1328   09/30/23 1329  For home use only DME Dan Humphreys  rolling  Once       Question Answer Comment  Walker: With 5 Inch Wheels   Patient needs a walker to treat with the following condition Weakness      09/30/23 1328            Follow-up Information     Duanne Limerick, MD. Schedule an appointment as soon as possible for a visit in 1 week(s).   Specialty: Family Medicine Contact information: [address] [place] [address] [phone]         Iran Ouch, MD. Schedule an appointment as soon as possible for a visit in 2 week(s).   Specialty: Cardiology Contact information: [address] [place]  [address] [phone]                No Known Allergies  Consultations: Cardiology   Procedures/Studies: ECHOCARDIOGRAM COMPLETE BUBBLE STUDY  Result Date: 09/27/2023    ECHOCARDIOGRAM REPORT   Patient Name:   Daniel Kirby Date of Exam: 09/27/2023 Medical Rec #:  [id number]     Height:       67.0 in Accession #:    [id number]    Weight:       225.6 lb Date of Birth:  01/30/1946     BSA:          2.128 m Patient Age:    77 years      BP:           148/104 mmHg Patient Gender: M             HR:           76 bpm. Exam Location:  [facility] Procedure: 2D Echo, Cardiac Doppler, Color Doppler, Strain Analysis and Saline            Contrast Bubble Study Indications:     Syncope  History:         Patient has no prior history of Echocardiogram examinations.                  Signs/Symptoms:Syncope and Dizziness/Lightheadedness; Risk                  Factors:Hypertension, Diabetes and Dyslipidemia. Pulmonary                  embolus.  Sonographer:     Mikki Harbor Referring Phys:  [id number] Eliezer Mccoy PATEL Diagnosing Phys: Lorine Bears MD  Sonographer Comments: Global longitudinal strain was attempted. IMPRESSIONS  1. Left ventricular ejection fraction, by estimation, is 55 to 60%. The left ventricle has normal function. The left ventricle has no regional wall motion abnormalities. There is mild asymmetric left ventricular hypertrophy of the basal-septal segment. Left ventricular diastolic parameters were normal.  2. Right ventricular systolic function is normal. The right ventricular size is normal. There is normal pulmonary artery systolic pressure.  3. The mitral valve is normal in structure. No evidence of mitral valve regurgitation. No evidence of mitral stenosis.  4. The aortic valve is normal in structure. Aortic valve regurgitation is not visualized. No aortic stenosis is present.  5. The inferior vena cava is normal in size with greater than 50% respiratory variability, suggesting right atrial pressure of  3 mmHg.  6. Agitated saline contrast bubble study was negative, with no evidence of any interatrial shunt. FINDINGS  Left Ventricle: Left ventricular ejection fraction, by estimation, is 55 to 60%. The left ventricle has normal function. The left  ventricle has no regional wall motion abnormalities. Global longitudinal strain performed but not reported based on interpreter judgement due to suboptimal tracking. The left ventricular internal cavity size was normal in size. There is mild asymmetric left ventricular hypertrophy of the basal-septal segment. Left ventricular diastolic parameters were normal. Right Ventricle: The right ventricular size is normal. No increase in right ventricular wall thickness. Right ventricular systolic function is normal. There is normal pulmonary artery systolic pressure. The tricuspid regurgitant velocity is 2.43 m/s, and  with an assumed right atrial pressure of 3 mmHg, the estimated right ventricular systolic pressure is 26.6 mmHg. Left Atrium: Left atrial size was normal in size. Right Atrium: Right atrial size was normal in size. Pericardium: There is no evidence of pericardial effusion. Mitral Valve: The mitral valve is normal in structure. No evidence of mitral valve regurgitation. No evidence of mitral valve stenosis. MV peak gradient, 2.9 mmHg. The mean mitral valve gradient is 1.0 mmHg. Tricuspid Valve: The tricuspid valve is normal in structure. Tricuspid valve regurgitation is trivial. No evidence of tricuspid stenosis. Aortic Valve: The aortic valve is normal in structure. Aortic valve regurgitation is not visualized. No aortic stenosis is present. Aortic valve mean gradient measures 3.0 mmHg. Aortic valve peak gradient measures 5.9 mmHg. Aortic valve area, by VTI measures 2.40 cm. Pulmonic Valve: The pulmonic valve was normal in structure. Pulmonic valve regurgitation is trivial. No evidence of pulmonic stenosis. Aorta: The aortic root is normal in size and structure.  Venous: The inferior vena cava is normal in size with greater than 50% respiratory variability, suggesting right atrial pressure of 3 mmHg. IAS/Shunts: No atrial level shunt detected by color flow Doppler. Agitated saline contrast was given intravenously to evaluate for intracardiac shunting. Agitated saline contrast bubble study was negative, with no evidence of any interatrial shunt.  LEFT VENTRICLE PLAX 2D LVIDd:         4.40 cm     Diastology LVIDs:         2.70 cm     LV e' medial:    8.70 cm/s LV PW:         1.10 cm     LV E/e' medial:  8.6 LV IVS:        1.10 cm     LV e' lateral:   9.57 cm/s LVOT diam:     2.10 cm     LV E/e' lateral: 7.8 LV SV:         64 LV SV Index:   30 LVOT Area:     3.46 cm  LV Volumes (MOD) LV vol d, MOD A2C: 51.7 ml LV vol d, MOD A4C: 60.7 ml LV vol s, MOD A2C: 30.3 ml LV vol s, MOD A4C: 31.3 ml LV SV MOD A2C:     21.4 ml LV SV MOD A4C:     60.7 ml LV SV MOD BP:      27.0 ml RIGHT VENTRICLE RV Basal diam:  3.45 cm RV Mid diam:    3.30 cm RV S prime:     17.70 cm/s TAPSE (M-mode): 2.1 cm LEFT ATRIUM             Index        RIGHT ATRIUM           Index LA diam:        3.30 cm 1.55 cm/m   RA Area:     15.90 cm LA Vol (A2C):   50.9 ml 23.91 ml/m  RA Volume:   42.10 ml  19.78 ml/m LA Vol (A4C):   21.4 ml 10.05 ml/m LA Biplane Vol: 34.2 ml 16.07 ml/m  AORTIC VALVE                    PULMONIC VALVE AV Area (Vmax):    2.10 cm     PV Vmax:       0.71 m/s AV Area (Vmean):   2.16 cm     PV Peak grad:  2.0 mmHg AV Area (VTI):     2.40 cm AV Vmax:           121.00 cm/s AV Vmean:          73.800 cm/s AV VTI:            0.265 m AV Peak Grad:      5.9 mmHg AV Mean Grad:      3.0 mmHg LVOT Vmax:         73.40 cm/s LVOT Vmean:        46.000 cm/s LVOT VTI:          0.184 m LVOT/AV VTI ratio: 0.69  AORTA Ao Root diam: 3.60 cm MITRAL VALVE               TRICUSPID VALVE MV Area (PHT): 2.87 cm    TR Peak grad:   23.6 mmHg MV Area VTI:   2.54 cm    TR Vmax:        243.00 cm/s MV Peak grad:  2.9  mmHg MV Mean grad:  1.0 mmHg    SHUNTS MV Vmax:       0.85 m/s    Systemic VTI:  0.18 m MV Vmean:      43.8 cm/s   Systemic Diam: 2.10 cm MV Decel Time: 264 msec MV E velocity: 74.40 cm/s MV A velocity: 73.90 cm/s MV E/A ratio:  1.01 Lorine Bears MD Electronically signed by Lorine Bears MD Signature Date/Time: 09/27/2023/3:45:28 PM    Final    US Venous Img Lower Bilateral (DVT)  Result Date: 09/26/2023 CLINICAL DATA:  Pulmonary embolus. EXAM: BILATERAL LOWER EXTREMITY VENOUS DOPPLER ULTRASOUND TECHNIQUE: Gray-scale sonography with graded compression, as well as color Doppler and duplex ultrasound were performed to evaluate the lower extremity deep venous systems from the level of the common femoral vein and including the common femoral, femoral, profunda femoral, popliteal and calf veins including the posterior tibial, peroneal and gastrocnemius veins when visible. The superficial great saphenous vein was also interrogated. Spectral Doppler was utilized to evaluate flow at rest and with distal augmentation maneuvers in the common femoral, femoral and popliteal veins. COMPARISON:  None Available. FINDINGS: RIGHT LOWER EXTREMITY Common Femoral Vein: No evidence of thrombus. Normal compressibility, respiratory phasicity and response to augmentation. Saphenofemoral Junction: No evidence of thrombus. Normal compressibility and flow on color Doppler imaging. Profunda Femoral Vein: No evidence of thrombus. Normal compressibility and flow on color Doppler imaging. Femoral Vein: No evidence of thrombus. Normal compressibility, respiratory phasicity and response to augmentation. Popliteal Vein: No evidence of thrombus. Normal compressibility, respiratory phasicity and response to augmentation. Calf Veins: No evidence for thrombus in the posterior tibial vein. Peroneal vein not well visualized. Other Findings:  None. LEFT LOWER EXTREMITY Common Femoral Vein: No evidence of thrombus. Normal compressibility,  respiratory phasicity and response to augmentation. Saphenofemoral Junction: No evidence of thrombus. Normal compressibility and flow on color Doppler imaging. Profunda Femoral Vein: No evidence of thrombus. Normal compressibility and flow  on color Doppler imaging. Femoral Vein: No evidence of thrombus. Normal compressibility, respiratory phasicity and response to augmentation. Popliteal Vein: Occlusive thrombus identified in the inferior popliteal vein. Calf Veins: No evidence of thrombus. Normal compressibility and flow on color Doppler imaging. Other Findings:  None. IMPRESSION: 1. Occlusive thrombus in the inferior left popliteal vein in this patient with known DVT by CTA chest yesterday. 2. No evidence for DVT in the right lower extremity. Electronically Signed   By: Kennith Center M.D.   On: 09/26/2023 13:12   CT Angio Chest PE W and/or Wo Contrast  Result Date: 09/26/2023 CLINICAL DATA:  Elevated D-dimer.  Dizziness.  Syncope. EXAM: CT ANGIOGRAPHY CHEST WITH CONTRAST TECHNIQUE: Multidetector CT imaging of the chest was performed using the standard protocol during bolus administration of intravenous contrast. Multiplanar CT image reconstructions and MIPs were obtained to evaluate the vascular anatomy. RADIATION DOSE REDUCTION: This exam was performed according to the departmental dose-optimization program which includes automated exposure control, adjustment of the mA and/or kV according to patient size and/or use of iterative reconstruction technique. CONTRAST:  OMNIPAQUE IOHEXOL 350 MG/ML SOLN COMPARISON:  Chest radiograph earlier today FINDINGS: Cardiovascular: Lobar and segmental pulmonary emboli in the right upper and lower lobe pulmonary arteries. Segmental pulmonary emboli in the lingular and left lower lobe pulmonary arteries. No evidence of right heart strain. No pericardial effusion. Mediastinum/Nodes: Trachea and esophagus are unremarkable. No thoracic adenopathy. Lungs/Pleura: Expiratory  phase exam. Bronchial wall thickening and mucous plugging. Bibasilar atelectasis. No pleural effusion or pneumothorax. Upper Abdomen: No acute abnormality. 1.5 cm left adrenal nodule with Hounsfield units of 29. Musculoskeletal: No acute fracture. Review of the MIP images confirms the above findings. IMPRESSION: 1. Bilateral lobar and segmental pulmonary emboli. No evidence of right heart strain. 2. Bronchial wall thickening and mucous plugging. 3. 1.5 cm left adrenal nodule. This is indeterminate but likely represents a benign adenoma. Follow-up in 12 months with adrenal protocol CT is recommended. Critical Value/emergent results were called by telephone at the time of interpretation on 09/26/2023 at 12:19 am to provider Manuela Schwartz, NP, who verbally acknowledged these results. Electronically Signed   By: Minerva Fester M.D.   On: 09/26/2023 00:19   MR BRAIN WO CONTRAST  Result Date: 09/25/2023 CLINICAL DATA:  Initial evaluation for syncope/presyncope, dizziness. EXAM: MRI HEAD WITHOUT CONTRAST TECHNIQUE: Multiplanar, multiecho pulse sequences of the brain and surrounding structures were obtained without intravenous contrast. COMPARISON:  Prior CT from earlier the same day. FINDINGS: Brain: Mild age-related cerebral atrophy with chronic small vessel ischemic disease. Small remote infarcts involving the right frontal lobe and right cerebellum. No evidence for acute or subacute ischemia. No acute intracranial hemorrhage. Few punctate chronic micro hemorrhages noted, likely hypertensive/small vessel related. No mass lesion, midline shift or mass effect. No hydrocephalus or extra-axial fluid collection. Pituitary gland within normal limits. Vascular: Major intracranial vascular flow voids are maintained. Skull and upper cervical spine: Craniocervical junction within normal limits. Bone marrow signal intensity within normal limits. No scalp soft tissue abnormality. Chronic mucosal thickening present about the  ethmoidal air cells and left greater than right maxillary sinuses. No mastoid effusion. Other: None. IMPRESSION: 1. No acute intracranial abnormality. 2. Mild age-related cerebral atrophy with chronic small vessel ischemic disease, with small remote infarcts involving the right frontal lobe and right cerebellum. Electronically Signed   By: Rise Mu M.D.   On: 09/25/2023 23:40   DG Chest Port 1 View  Result Date: 09/25/2023 CLINICAL DATA:  Syncope EXAM:  PORTABLE CHEST 1 VIEW COMPARISON:  Chest x-ray 12/19/2018 FINDINGS: The heart size and mediastinal contours are within normal limits. Both lungs are clear. The visualized skeletal structures are unremarkable. IMPRESSION: No active disease. Electronically Signed   By: Darliss Cheney M.D.   On: 09/25/2023 22:48   CT HEAD WO CONTRAST  Result Date: 09/25/2023 CLINICAL DATA:  dizziness EXAM: CT HEAD WITHOUT CONTRAST TECHNIQUE: Contiguous axial images were obtained from the base of the skull through the vertex without intravenous contrast. RADIATION DOSE REDUCTION: This exam was performed according to the departmental dose-optimization program which includes automated exposure control, adjustment of the mA and/or kV according to patient size and/or use of iterative reconstruction technique. COMPARISON:  None Available. FINDINGS: Brain: Patchy and confluent areas of decreased attenuation are noted throughout the deep and periventricular white matter of the cerebral hemispheres bilaterally, compatible with chronic microvascular ischemic disease. Right cerebellar lacunar infarction. No evidence of large-territorial acute infarction. No parenchymal hemorrhage. No mass lesion. No extra-axial collection. No mass effect or midline shift. No hydrocephalus. Basilar cisterns are patent. Vascular: No hyperdense vessel. Atherosclerotic calcifications are present within the cavernous internal carotid and vertebral arteries. Skull: No acute fracture or focal lesion.  Sinuses/Orbits: Left maxillary sinus mucosal thickening with wall hypertrophy. Otherwise paranasal sinuses and mastoid air cells are clear. Left orbital lens replacement. Otherwise the orbits are unremarkable. Other: None. IMPRESSION: 1. No acute intracranial abnormality. 2. Chronic left maxillary sinus disease. Electronically Signed   By: Tish Frederickson M.D.   On: 09/25/2023 19:34      Subjective: Seen and examined on the day of discharge.  Stable no distress.  Prefer discharge home.  Discharge Exam: Vitals:   10/01/23 0700 10/01/23 1100  BP: 128/72 120/77  Pulse: 63 62  Resp: 20 20  Temp: 99 F (37.2 C) 99.1 F (37.3 C)  SpO2: 99% 98%   Vitals:   09/30/23 2309 10/01/23 0347 10/01/23 0700 10/01/23 1100  BP: (!) 163/75 (!) 154/74 128/72 120/77  Pulse: 79 (!) 57 63 62  Resp:  18 20 20   Temp:  98.9 F (37.2 C) 99 F (37.2 C) 99.1 F (37.3 C)  TempSrc:  Oral Oral Oral  SpO2:  100% 99% 98%  Weight:      Height:        General: Pt is alert, awake, not in acute distress Cardiovascular: RRR, S1/S2 +, no rubs, no gallops Respiratory: CTA bilaterally, no wheezing, no rhonchi Abdominal: Soft, NT, ND, bowel sounds + Extremities: no edema, no cyanosis    The results of significant diagnostics from this hospitalization (including imaging, microbiology, ancillary and laboratory) are listed below for reference.     Microbiology: No results found for this or any previous visit (from the past 240 hour(s)).   Labs: BNP (last 3 results) No results for input(s): "BNP" in the last 8760 hours. Basic Metabolic Panel: Recent Labs  Lab 09/26/23 2043 09/27/23 0719 09/27/23 1623 09/28/23 0356 09/29/23 0449 09/30/23 0541  NA 135 136  --  135 138 136  K 2.6* 2.5* 3.1* 3.0* 3.5 3.7  CL 96* 97*  --  102 101 103  CO2 29 30  --  27 26 24   GLUCOSE 134* 117*  --  148* 128* 134*  BUN 9 10  --  8 9 11   CREATININE 1.14 0.94  --  0.98 0.90 0.85  CALCIUM 8.1* 8.0*  --  7.6* 7.8* 7.8*  MG  2.1 2.0  --  2.0 1.9 1.9  PHOS  --  2.6  --   --   --   --    Liver Function Tests: Recent Labs  Lab 09/25/23 2207 09/26/23 0320  AST 36 26  ALT 28 22  ALKPHOS 51 47  BILITOT 0.7 0.9  PROT 6.8 6.4*  ALBUMIN 3.5 3.3*   No results for input(s): "LIPASE", "AMYLASE" in the last 168 hours. No results for input(s): "AMMONIA" in the last 168 hours. CBC: Recent Labs  Lab 09/25/23 1738 09/27/23 0719 09/28/23 0356 09/29/23 0449 09/30/23 0541  WBC 9.8 7.5 6.7 6.6 6.0  HGB 13.0 11.1* 11.2* 11.0* 10.4*  HCT 37.0* 31.7* 32.2* 32.3* 29.8*  MCV 87.3 87.6 88.7 89.0 91.4  PLT 330 296 276 270 204   Cardiac Enzymes: Recent Labs  Lab 09/25/23 2207 09/26/23 0319 09/27/23 0719 09/28/23 0356 09/29/23 0449  CKTOTAL 1,431* 1,151* 1,780* 2,004* 2,129*   BNP: Invalid input(s): "POCBNP" CBG: Recent Labs  Lab 09/30/23 1155 09/30/23 1552 09/30/23 2127 10/01/23 0814 10/01/23 1155  GLUCAP 161* 141* 142* 139* 165*   D-Dimer No results for input(s): "DDIMER" in the last 72 hours. Hgb A1c No results for input(s): "HGBA1C" in the last 72 hours. Lipid Profile No results for input(s): "CHOL", "HDL", "LDLCALC", "TRIG", "CHOLHDL", "LDLDIRECT" in the last 72 hours. Thyroid function studies No results for input(s): "TSH", "T4TOTAL", "T3FREE", "THYROIDAB" in the last 72 hours.  Invalid input(s): "FREET3" Anemia work up No results for input(s): "VITAMINB12", "FOLATE", "FERRITIN", "TIBC", "IRON", "RETICCTPCT" in the last 72 hours. Urinalysis    Component Value Date/Time   COLORURINE YELLOW (A) 09/26/2023 0320   APPEARANCEUR CLEAR (A) 09/26/2023 0320   APPEARANCEUR Clear 01/06/2018 1503   LABSPEC 1.021 09/26/2023 0320   PHURINE 7.0 09/26/2023 0320   GLUCOSEU 50 (A) 09/26/2023 0320   HGBUR SMALL (A) 09/26/2023 0320   BILIRUBINUR NEGATIVE 09/26/2023 0320   BILIRUBINUR Negative 01/06/2018 1503   KETONESUR NEGATIVE 09/26/2023 0320   PROTEINUR NEGATIVE 09/26/2023 0320   UROBILINOGEN 0.2  11/05/2017 1609   NITRITE NEGATIVE 09/26/2023 0320   LEUKOCYTESUR NEGATIVE 09/26/2023 0320   Sepsis Labs Recent Labs  Lab 09/27/23 0719 09/28/23 0356 09/29/23 0449 09/30/23 0541  WBC 7.5 6.7 6.6 6.0   Microbiology No results found for this or any previous visit (from the past 240 hour(s)).   Time coordinating discharge: Over 30 minutes  SIGNED:   Tresa Moore, MD  [facility] 10/01/2023, 2:00 PM Pager   If 7PM-7AM, please contact night-coverage

## 2023-10-01 NOTE — TOC Transition Note (Signed)
Transition of Care [facility]) - CM/SW Discharge Note   Patient Details  Name: Daniel Kirby MRN: [id number] Date of Birth: [birth year]  [facility] CM/SW Contact:  Darolyn Rua, LCSW Phone Number: 10/01/2023, 2:06 PM   Clinical Narrative:     Patient will DC to: [name] Anticipated DC date: 10/01/23 Transport by: Wendie Simmer  Per MD patient ready for DC to [facility]. RN, patient, patient's family, and facility notified of DC. Discharge Summary sent to facility. RN given number for report  [phone] Room 103. DC packet on chart. Ambulance transport requested for patient.  CSW signing off.     Final next level of care: Skilled Nursing Facility Barriers to Discharge: No Barriers Identified   Patient Goals and CMS Choice CMS Medicare.gov Compare Post Acute Care list provided to:: Patient Choice offered to / list presented to : Patient  Discharge Placement                Patient [facility] at[name] Patient to be transferred to facility by: [facility]   Patient and family notified of of transfer: 10/01/23  Discharge Plan and Services Additional resources added to the After Visit Summary for                                       Social Determinants of Health (SDOH) Interventions SDOH Screenings   Food Insecurity: Food Insecurity Present (11/23/2022)  Housing: Low Risk  (11/23/2022)  Transportation Needs: No Transportation Needs (11/23/2022)  Utilities: Not At Risk (11/23/2022)  Alcohol Screen: Low Risk  (11/05/2021)  Depression (PHQ2-9): Low Risk  (05/14/2023)  Financial Resource Strain: Low Risk  (11/23/2022)  Physical Activity: Inactive (11/23/2022)  Social Connections: Socially Isolated (11/23/2022)  Stress: No Stress Concern Present (11/23/2022)  Tobacco Use: High Risk (09/25/2023)     Readmission Risk Interventions     No data to display

## 2023-10-01 NOTE — Progress Notes (Signed)
PROGRESS NOTE    Daniel Kirby  NWG:[id number] DOB: [birth year] DOA: 09/25/2023 PCP: Duanne Limerick, MD    Brief Narrative:   Presented with several weeks weakness and lightneadedness and syncope with ambulation and difficulty getting up from the ground and lower extremity swelling, found to have bilateral PE    Assessment & Plan:   Principal Problem:   Acute pulmonary embolism (HCC) Active Problems:   Syncope and collapse   Essential hypertension   Adult hypothyroidism   Type 2 diabetes mellitus without complication, without long-term current use of insulin (HCC)   Weakness   Dizziness   Electrolyte abnormality   Obesity (BMI 30-39.9)   DVT (deep venous thrombosis) (HCC)   Ventricular tachycardia (HCC)   Hypokalemia  # Acute pulmonary embolism # DVT B/l, no evidence heart strain, also left popliteal DVT.  Hemodynamically stable. TTE unremarkable -Continue apixaban, at least 74-month course   # Debility -Therapy advising SNF however patient is reluctant.  After discussions with patient's family and TOC decision made to proceed with skilled nursing facility search.  Patient is medically ready for discharge when SNF bed is found.   # History prostate cancer PSA undetectable   # T2DM A1c in the 7s, euglycemic here - hold home orals - SSI   # Hypokalemia # Hypomagnesemia Denies vomiting/diarrhea, is on hydrochlorothiazide at home, also hypothyroid - replete as needed   # Hypothyroid Uncontrolled with elevated tsh and low t4, patient reports noncompliance w/ home synthroid. No obtundation, hypothermia, hyponatremia, hypotension to suggest myxedema coma, discussed w/ ICU attending today and he agrees. - resume home synthroid, repeat TFTs 4-6 wks   # Adrenal nodule, incidental - outpt f/u   # Elevated CK Most likely 2/2 acute dvt/pe, did have recent falls as well with inability to get up. CK stable 1-2k, no aki. No muscle pain or tenderness. -Under 3000.  No need to  trend   # HTN  Here normotensive - home losartan/hdtz on hold   # History prior strokes Seen on MRI, nothing acute - LDL is 130 so needs higher intensity statin but for now statin on hold 2/2 elevated Ck   # Obesity noted      DVT prophylaxis: Apixaban Code Status: Full Family Communication: None Disposition Plan: Status is: Inpatient Remains inpatient appropriate because: Unsafe discharge plan.  Pending skilled nursing facility bed.   Level of care: Progressive  Consultants:  Cardiology  Procedures:  None  Antimicrobials: None   Subjective: Seen and examined.  Sitting up eating breakfast.  No distress.  No complaints  Objective: Vitals:   09/30/23 2309 10/01/23 0347 10/01/23 0700 10/01/23 1100  BP: (!) 163/75 (!) 154/74 128/72 120/77  Pulse: 79 (!) 57 63 62  Resp:  18 20 20   Temp:  98.9 F (37.2 C) 99 F (37.2 C) 99.1 F (37.3 C)  TempSrc:  Oral Oral Oral  SpO2:  100% 99% 98%  Weight:      Height:        Intake/Output Summary (Last 24 hours) at 10/01/2023 1315 Last data filed at 10/01/2023 1132 Gross per 24 hour  Intake 946 ml  Output 2000 ml  Net -1054 ml   Filed Weights   09/26/23 0041 09/29/23 0500  Weight: 102.3 kg 98.7 kg    Examination:  General exam: No acute distress Respiratory system: Lungs clear.  Normal work of breathing.  Room air Cardiovascular system: S1-2, RRR, no murmurs, no pedal edema Gastrointestinal system: Soft, NT/ND, normal bowel  sounds Central nervous system: Alert and oriented. No focal neurological deficits. Extremities: Symmetric 5 x 5 power. Skin: No rashes, lesions or ulcers Psychiatry: Judgement and insight appear normal. Mood & affect flattened.     Data Reviewed: I have personally reviewed following labs and imaging studies  CBC: Recent Labs  Lab 09/25/23 1738 09/27/23 0719 09/28/23 0356 09/29/23 0449 09/30/23 0541  WBC 9.8 7.5 6.7 6.6 6.0  HGB 13.0 11.1* 11.2* 11.0* 10.4*  HCT 37.0* 31.7*  32.2* 32.3* 29.8*  MCV 87.3 87.6 88.7 89.0 91.4  PLT 330 296 276 270 204   Basic Metabolic Panel: Recent Labs  Lab 09/26/23 2043 09/27/23 0719 09/27/23 1623 09/28/23 0356 09/29/23 0449 09/30/23 0541  NA 135 136  --  135 138 136  K 2.6* 2.5* 3.1* 3.0* 3.5 3.7  CL 96* 97*  --  102 101 103  CO2 29 30  --  27 26 24   GLUCOSE 134* 117*  --  148* 128* 134*  BUN 9 10  --  8 9 11   CREATININE 1.14 0.94  --  0.98 0.90 0.85  CALCIUM 8.1* 8.0*  --  7.6* 7.8* 7.8*  MG 2.1 2.0  --  2.0 1.9 1.9  PHOS  --  2.6  --   --   --   --    GFR: Estimated Creatinine Clearance: 81.4 mL/min (by C-G formula based on SCr of 0.85 mg/dL). Liver Function Tests: Recent Labs  Lab 09/25/23 2207 09/26/23 0320  AST 36 26  ALT 28 22  ALKPHOS 51 47  BILITOT 0.7 0.9  PROT 6.8 6.4*  ALBUMIN 3.5 3.3*   No results for input(s): "LIPASE", "AMYLASE" in the last 168 hours. No results for input(s): "AMMONIA" in the last 168 hours. Coagulation Profile: Recent Labs  Lab 09/25/23 2207  INR 1.0   Cardiac Enzymes: Recent Labs  Lab 09/25/23 2207 09/26/23 0319 09/27/23 0719 09/28/23 0356 09/29/23 0449  CKTOTAL 1,431* 1,151* 1,780* 2,004* 2,129*   BNP (last 3 results) No results for input(s): "PROBNP" in the last 8760 hours. HbA1C: No results for input(s): "HGBA1C" in the last 72 hours. CBG: Recent Labs  Lab 09/30/23 1155 09/30/23 1552 09/30/23 2127 10/01/23 0814 10/01/23 1155  GLUCAP 161* 141* 142* 139* 165*   Lipid Profile: No results for input(s): "CHOL", "HDL", "LDLCALC", "TRIG", "CHOLHDL", "LDLDIRECT" in the last 72 hours.  Thyroid Function Tests: No results for input(s): "TSH", "T4TOTAL", "FREET4", "T3FREE", "THYROIDAB" in the last 72 hours. Anemia Panel: No results for input(s): "VITAMINB12", "FOLATE", "FERRITIN", "TIBC", "IRON", "RETICCTPCT" in the last 72 hours. Sepsis Labs: Recent Labs  Lab 09/25/23 2207 09/26/23 0055 09/27/23 0719  PROCALCITON <0.10  --   --   LATICACIDVEN 3.4*  2.9* 1.8    No results found for this or any previous visit (from the past 240 hour(s)).       Radiology Studies: No results found.      Scheduled Meds:  apixaban  10 mg Oral BID   Followed by   Melene Muller ON 10/05/2023] apixaban  5 mg Oral BID   insulin aspart  0-6 Units Subcutaneous TID WC   levothyroxine  125 mcg Oral Q0600   mirabegron ER  50 mg Oral Daily   polyethylene glycol  17 g Oral Daily   senna-docusate  1 tablet Oral BID   spironolactone  25 mg Oral Daily   Continuous Infusions:   LOS: 6 days    Tresa Moore, MD [facility]   If 7PM-7AM, please  contact night-coverage  10/01/2023, 1:15 PM

## 2023-10-01 NOTE — TOC Progression Note (Addendum)
Transition of Care [facility]) - Progression Note    Patient Details  Name: Daniel Kirby MRN: [id number] Date of Birth: [birth year]  Transition of [facility]) CM/SW Contact  Darolyn Rua, [place] Phone Number: 10/01/2023, 8:43 AM  Clinical Narrative:     Update 1:42 pm: PASRR Number: [id number]    Patient is approved for [facility] auth ID: [id number] Auth ID: [id number] 11/15-11/19  Pending PASRR number at this time, CSW has called [facility] they report that patient SS card fax did not come through yesterday, requested re fax to [phone], sent.    Expected Discharge Plan: Skilled Nursing Facility Barriers to Discharge: Continued Medical Work up  Expected Discharge Plan and Services       Living arrangements for the past 2 months: Single Family Home                                       Social Determinants of Health (SDOH) Interventions SDOH Screenings   Food Insecurity: Food Insecurity Present (11/23/2022)  Housing: Low Risk  (11/23/2022)  Transportation Needs: No Transportation Needs (11/23/2022)  Utilities: Not At Risk (11/23/2022)  Alcohol Screen: Low Risk  (11/05/2021)  Depression (PHQ2-9): Low Risk  (05/14/2023)  Financial Resource Strain: Low Risk  (11/23/2022)  Physical Activity: Inactive (11/23/2022)  Social Connections: Socially Isolated (11/23/2022)  Stress: No Stress Concern Present (11/23/2022)  Tobacco Use: High Risk (09/25/2023)    Readmission Risk Interventions     No data to display

## 2023-10-01 NOTE — Plan of Care (Signed)
  Problem: Education: Goal: Knowledge of condition and prescribed therapy will improve Outcome: Progressing   Problem: Health Behavior/Discharge Planning: Goal: Ability to manage health-related needs will improve Outcome: Progressing   Problem: Clinical Measurements: Goal: Will remain free from infection Outcome: Progressing   Problem: Clinical Measurements: Goal: Respiratory complications will improve Outcome: Progressing   Problem: Clinical Measurements: Goal: Cardiovascular complication will be avoided Outcome: Progressing   Problem: Pain Management: Goal: General experience of comfort will improve Outcome: Progressing   Problem: Safety: Goal: Ability to remain free from injury will improve Outcome: Progressing

## 2023-10-04 DIAGNOSIS — R531 Weakness: Secondary | ICD-10-CM | POA: Diagnosis not present

## 2023-10-04 DIAGNOSIS — E039 Hypothyroidism, unspecified: Secondary | ICD-10-CM | POA: Diagnosis not present

## 2023-10-04 DIAGNOSIS — I2699 Other pulmonary embolism without acute cor pulmonale: Secondary | ICD-10-CM | POA: Diagnosis not present

## 2023-10-04 DIAGNOSIS — E119 Type 2 diabetes mellitus without complications: Secondary | ICD-10-CM | POA: Diagnosis not present

## 2023-10-04 DIAGNOSIS — I82432 Acute embolism and thrombosis of left popliteal vein: Secondary | ICD-10-CM | POA: Diagnosis not present

## 2023-10-04 NOTE — Consult Note (Signed)
[facility] Liaison Note  10/04/2023  RIPLEY STADNIK 10-30-1946 [id number]  Location: RN Hospital Liaison screened the patient remotely at [facility]  Insurance: [employer] HMO   Dustyn SEMIR SCIMECA is a 77 y.o. male who is a Primary Care Patient of Duanne Limerick, MD- [facility] and Sports Medicine at [facility]. The patient was screened for readmission hospitalization with noted low risk score for unplanned readmission risk with 1 IP in 6 months.  The patient was assessed for potential Care Management service needs for post hospital transition for care coordination. Review of patient's electronic medical record reveals patient was admitted with Acute pulmonary embolism. Pt discharged to SNF level of care. Facility will continue to address pt's ongoing needs.   [facility] Care Management/Population Health does not replace or interfere with any arrangements made by the Inpatient Transition of Care team.   For questions contact:   Elliot Cousin, RN, [facility] Liaison Warden   [facility], Population Health Office Hours MTWF  8:00 am-6:00 pm Direct Dial: [phone] mobile [phone] [Office toll free line] Office Hours are M-F 8:30 - 5 pm [email or web address]

## 2023-10-05 DIAGNOSIS — C61 Malignant neoplasm of prostate: Secondary | ICD-10-CM | POA: Diagnosis not present

## 2023-10-05 DIAGNOSIS — E119 Type 2 diabetes mellitus without complications: Secondary | ICD-10-CM | POA: Diagnosis not present

## 2023-10-05 DIAGNOSIS — E039 Hypothyroidism, unspecified: Secondary | ICD-10-CM | POA: Diagnosis not present

## 2023-10-05 DIAGNOSIS — M6281 Muscle weakness (generalized): Secondary | ICD-10-CM | POA: Diagnosis not present

## 2023-10-05 DIAGNOSIS — I10 Essential (primary) hypertension: Secondary | ICD-10-CM | POA: Diagnosis not present

## 2023-10-05 DIAGNOSIS — M6259 Muscle wasting and atrophy, not elsewhere classified, multiple sites: Secondary | ICD-10-CM | POA: Diagnosis not present

## 2023-10-05 DIAGNOSIS — E785 Hyperlipidemia, unspecified: Secondary | ICD-10-CM | POA: Diagnosis not present

## 2023-10-05 DIAGNOSIS — I82432 Acute embolism and thrombosis of left popliteal vein: Secondary | ICD-10-CM | POA: Diagnosis not present

## 2023-10-05 DIAGNOSIS — Z741 Need for assistance with personal care: Secondary | ICD-10-CM | POA: Diagnosis not present

## 2023-10-05 DIAGNOSIS — I2609 Other pulmonary embolism with acute cor pulmonale: Secondary | ICD-10-CM | POA: Diagnosis not present

## 2023-10-05 DIAGNOSIS — R2681 Unsteadiness on feet: Secondary | ICD-10-CM | POA: Diagnosis not present

## 2023-10-06 DIAGNOSIS — E039 Hypothyroidism, unspecified: Secondary | ICD-10-CM | POA: Diagnosis not present

## 2023-10-06 DIAGNOSIS — I82432 Acute embolism and thrombosis of left popliteal vein: Secondary | ICD-10-CM | POA: Diagnosis not present

## 2023-10-06 DIAGNOSIS — R531 Weakness: Secondary | ICD-10-CM | POA: Diagnosis not present

## 2023-10-06 DIAGNOSIS — I2699 Other pulmonary embolism without acute cor pulmonale: Secondary | ICD-10-CM | POA: Diagnosis not present

## 2023-10-06 DIAGNOSIS — E119 Type 2 diabetes mellitus without complications: Secondary | ICD-10-CM | POA: Diagnosis not present

## 2023-10-08 DIAGNOSIS — E119 Type 2 diabetes mellitus without complications: Secondary | ICD-10-CM | POA: Diagnosis not present

## 2023-10-08 DIAGNOSIS — I82432 Acute embolism and thrombosis of left popliteal vein: Secondary | ICD-10-CM | POA: Diagnosis not present

## 2023-10-08 DIAGNOSIS — R2681 Unsteadiness on feet: Secondary | ICD-10-CM | POA: Diagnosis not present

## 2023-10-08 DIAGNOSIS — I2609 Other pulmonary embolism with acute cor pulmonale: Secondary | ICD-10-CM | POA: Diagnosis not present

## 2023-10-08 DIAGNOSIS — I2699 Other pulmonary embolism without acute cor pulmonale: Secondary | ICD-10-CM | POA: Diagnosis not present

## 2023-10-08 DIAGNOSIS — R531 Weakness: Secondary | ICD-10-CM | POA: Diagnosis not present

## 2023-10-08 DIAGNOSIS — E039 Hypothyroidism, unspecified: Secondary | ICD-10-CM | POA: Diagnosis not present

## 2023-10-08 DIAGNOSIS — Z741 Need for assistance with personal care: Secondary | ICD-10-CM | POA: Diagnosis not present

## 2023-10-08 DIAGNOSIS — M6281 Muscle weakness (generalized): Secondary | ICD-10-CM | POA: Diagnosis not present

## 2023-10-11 DIAGNOSIS — R531 Weakness: Secondary | ICD-10-CM | POA: Diagnosis not present

## 2023-10-11 DIAGNOSIS — I82432 Acute embolism and thrombosis of left popliteal vein: Secondary | ICD-10-CM | POA: Diagnosis not present

## 2023-10-11 DIAGNOSIS — E119 Type 2 diabetes mellitus without complications: Secondary | ICD-10-CM | POA: Diagnosis not present

## 2023-10-11 DIAGNOSIS — E039 Hypothyroidism, unspecified: Secondary | ICD-10-CM | POA: Diagnosis not present

## 2023-10-11 DIAGNOSIS — I2699 Other pulmonary embolism without acute cor pulmonale: Secondary | ICD-10-CM | POA: Diagnosis not present

## 2023-10-12 ENCOUNTER — Telehealth: Payer: Self-pay | Admitting: Family Medicine

## 2023-10-12 DIAGNOSIS — E119 Type 2 diabetes mellitus without complications: Secondary | ICD-10-CM | POA: Diagnosis not present

## 2023-10-12 DIAGNOSIS — I82432 Acute embolism and thrombosis of left popliteal vein: Secondary | ICD-10-CM | POA: Diagnosis not present

## 2023-10-12 DIAGNOSIS — E039 Hypothyroidism, unspecified: Secondary | ICD-10-CM | POA: Diagnosis not present

## 2023-10-12 DIAGNOSIS — R531 Weakness: Secondary | ICD-10-CM | POA: Diagnosis not present

## 2023-10-12 DIAGNOSIS — I2699 Other pulmonary embolism without acute cor pulmonale: Secondary | ICD-10-CM | POA: Diagnosis not present

## 2023-10-12 NOTE — Telephone Encounter (Signed)
Copied from CRM [id number]. Topic: Appointment Scheduling - Scheduling Inquiry for Clinic >> Oct 12, 2023  3:32 PM Lennox Pippins wrote: Camelia Eng, Social Worker from [facility] called to schedule Hospital Follow Up. Patient was seen in [facility] 09/25/2023-10/01/2023 and went straight into [facility] and rehab.   No HFU with PCP within the 14 day period, please advise when patient needs to be scheduled for HFU and contact patient and Terri back.   Terri requesting a call back about it too at phone # [phone]  Patient's callback # [phone]

## 2023-10-18 DIAGNOSIS — E039 Hypothyroidism, unspecified: Secondary | ICD-10-CM | POA: Diagnosis not present

## 2023-10-18 DIAGNOSIS — E669 Obesity, unspecified: Secondary | ICD-10-CM | POA: Diagnosis not present

## 2023-10-18 DIAGNOSIS — E119 Type 2 diabetes mellitus without complications: Secondary | ICD-10-CM | POA: Diagnosis not present

## 2023-10-18 DIAGNOSIS — I82432 Acute embolism and thrombosis of left popliteal vein: Secondary | ICD-10-CM | POA: Diagnosis not present

## 2023-10-18 DIAGNOSIS — I10 Essential (primary) hypertension: Secondary | ICD-10-CM | POA: Diagnosis not present

## 2023-10-18 DIAGNOSIS — I2699 Other pulmonary embolism without acute cor pulmonale: Secondary | ICD-10-CM | POA: Diagnosis not present

## 2023-10-18 DIAGNOSIS — I472 Ventricular tachycardia, unspecified: Secondary | ICD-10-CM | POA: Diagnosis not present

## 2023-10-18 DIAGNOSIS — E785 Hyperlipidemia, unspecified: Secondary | ICD-10-CM | POA: Diagnosis not present

## 2023-10-18 DIAGNOSIS — C61 Malignant neoplasm of prostate: Secondary | ICD-10-CM | POA: Diagnosis not present

## 2023-10-19 ENCOUNTER — Ambulatory Visit: Payer: Medicare PPO | Attending: Medical | Admitting: Medical

## 2023-10-19 NOTE — Progress Notes (Unsigned)
Cardiology Office Note:    Date:  10/19/2023   ID:  Daniel Kirby, DOB [birth year], MRN [id number]  PCP:  Duanne Limerick, MD  [facility] Cardiologist:  None  [facility] Electrophysiologist:  None   Referring MD: Duanne Limerick, MD   Chief Complaint: Hospital follow-up  History of Present Illness:    Daniel Kirby is a 77 y.o. male with a hx of diabetes, hypertension, hypothyroidism who is being seen for hospital follow-up.  The patient was admitted in November 2020 for with dizziness, syncope due to orthostatic dizziness.  He denies chest pain.  He had stopped taking hypothyroid medication.  Potassium was 2 and TSH was severely elevated.  Chest CT showed bilateral pulmonary embolism and he was started on heparin.  He was noted to have PVCs and a run of polymorphic VT  Past Medical History:  Diagnosis Date   Abdominal pain    RUQ   Arthritis    right ankle   ED (erectile dysfunction)    HLD (hyperlipidemia)    Hypertension    Hypothyroid    Prostate cancer (HCC)    Vertigo    1 episode only, approx 1 month ago    Past Surgical History:  Procedure Laterality Date   CATARACT EXTRACTION W/PHACO Left 06/04/2021   Procedure: CATARACT EXTRACTION PHACO AND INTRAOCULAR LENS PLACEMENT (IOC) LEFT DIABETIC 5.51 01:10.7;  Surgeon: Lockie Mola, MD;  Location[facility];  Service: Ophthalmology;  Laterality: Left;   COLONOSCOPY  2006   normal   COLONOSCOPY WITH PROPOFOL N/A 09/06/2015   Procedure: COLONOSCOPY WITH PROPOFOL;  Surgeon: Midge Minium, MD;  Location[facility];  Service: Endoscopy;  Laterality: N/A;   POLYPECTOMY  09/06/2015   Procedure: POLYPECTOMY;  Surgeon: Midge Minium, MD;  Location: [facility];  Service: Endoscopy;;   PROSTATE SURGERY     robotic prostatectomy  2006    Current Medications: No outpatient medications have been marked as taking for the 10/19/23 encounter (Appointment) with Fransico Michael, Lititia Sen H, PA-C.      Allergies:   Patient has no known allergies.   Social History   Socioeconomic History   Marital status: Legally Separated    Spouse name: Not on file   Number of children: 3   Years of education: Not on file   Highest education level: 12th grade  Occupational History   Occupation: Retired  Tobacco Use   Smoking status: Former    Current packs/day: 1.00    Average packs/day: 1 pack/day for 16.0 years (16.0 ttl pk-yrs)    Types: Cigars, Cigarettes   Smokeless tobacco: Current    Types: Snuff   Tobacco comments:    Quit 2007. Smoking cessation materials not required  Vaping Use   Vaping status: Never Used  Substance and Sexual Activity   Alcohol use: Not Currently    Alcohol/week: 0.0 standard drinks of alcohol    Comment: occasional, 1-2 drinks per year   Drug use: No   Sexual activity: Yes  Other Topics Concern   Not on file  Social History Narrative   Lives alone.    Social Determinants of Health   Financial Resource Strain: Low Risk  (11/23/2022)   Overall Financial Resource Strain (CARDIA)    Difficulty of Paying Living Expenses: Not hard at all  Food Insecurity: Food Insecurity Present (11/23/2022)   Hunger Vital Sign    Worried About Running Out of Food in the Last Year: Often true    Ran  Out of Food in the Last Year: Often true  Transportation Needs: No Transportation Needs (11/23/2022)   PRAPARE - Administrator, Civil Service (Medical): No    Lack of Transportation (Non-Medical): No  Physical Activity: Inactive (11/23/2022)   Exercise Vital Sign    Days of Exercise per Week: 0 days    Minutes of Exercise per Session: 0 min  Stress: No Stress Concern Present (11/23/2022)   [employer] of Occupational Health - Occupational Stress Questionnaire    Feeling of Stress : Not at all  Social Connections: Socially Isolated (11/23/2022)   Social Connection and Isolation Panel [NHANES]    Frequency of Communication with Friends and Family: More than three  times a week    Frequency of Social Gatherings with Friends and Family: Once a week    Attends Religious Services: Never    Database administrator or Organizations: No    Attends Engineer, structural: Never    Marital Status: Separated     Family History: The patient's ***family history includes Diabetes in his mother; Heart attack in his father; Kidney disease in his brother.  ROS:   Please see the history of present illness.    *** All other systems reviewed and are negative.  EKGs/Labs/Other Studies Reviewed:    The following studies were reviewed today: ***  EKG:  EKG is *** ordered today.  The ekg ordered today demonstrates ***  Recent Labs: 09/25/2023: TSH 35.582 09/26/2023: ALT 22 09/30/2023: BUN 11; Creatinine, Ser 0.85; Hemoglobin 10.4; Magnesium 1.9; Platelets 204; Potassium 3.7; Sodium 136  Recent Lipid Panel    Component Value Date/Time   CHOL 198 09/27/2023 0719   CHOL 116 01/14/2023 1504   TRIG 131 09/27/2023 0719   HDL 42 09/27/2023 0719   HDL 31 (L) 01/14/2023 1504   CHOLHDL 4.7 09/27/2023 0719   VLDL 26 09/27/2023 0719   LDLCALC 130 (H) 09/27/2023 0719   LDLCALC 56 01/14/2023 1504     Risk Assessment/Calculations:   {Does this patient have ATRIAL FIBRILLATION?:[id number]}   Physical Exam:    VS:  There were no vitals taken for this visit.    Wt Readings from Last 3 Encounters:  09/29/23 217 lb 9.5 oz (98.7 kg)  08/16/23 197 lb (89.4 kg)  05/14/23 215 lb (97.5 kg)     GEN: *** Well nourished, well developed in no acute distress HEENT: Normal NECK: No JVD; No carotid bruits LYMPHATICS: No lymphadenopathy CARDIAC: ***RRR, no murmurs, rubs, gallops RESPIRATORY:  Clear to auscultation without rales, wheezing or rhonchi  ABDOMEN: Soft, non-tender, non-distended MUSCULOSKELETAL:  No edema; No deformity  SKIN: Warm and dry NEUROLOGIC:  Alert and oriented x 3 PSYCHIATRIC:  Normal affect   ASSESSMENT:    No diagnosis found. PLAN:     In order of problems listed above:  ***  Disposition: Follow up {follow up:15908} with ***   Shared Decision Making/Informed Consent   {Are you ordering a CV Procedure (e.g. stress test, cath, DCCV, TEE, etc)?   Press F2        :[id number]}    Signed, Faheem Ziemann David Stall, PA-C  10/19/2023 7:30 AM    [facility]

## 2023-10-26 ENCOUNTER — Encounter: Payer: Self-pay | Admitting: Family Medicine

## 2023-10-26 ENCOUNTER — Other Ambulatory Visit: Payer: Self-pay | Admitting: Family Medicine

## 2023-10-26 ENCOUNTER — Ambulatory Visit (INDEPENDENT_AMBULATORY_CARE_PROVIDER_SITE_OTHER): Payer: Medicare PPO | Admitting: Family Medicine

## 2023-10-26 VITALS — BP 138/68 | HR 95 | Ht 67.0 in | Wt 201.0 lb

## 2023-10-26 DIAGNOSIS — E876 Hypokalemia: Secondary | ICD-10-CM | POA: Diagnosis not present

## 2023-10-26 DIAGNOSIS — E782 Mixed hyperlipidemia: Secondary | ICD-10-CM

## 2023-10-26 NOTE — Progress Notes (Signed)
Date:  10/26/2023   Name:  Daniel Kirby   DOB:  08-25-1946   MRN:  [id number]   Chief Complaint: Follow-up (Hypokalemia, weakness, pt feels better)  Patient is a 77 year old male who presents for a recheck hospitalization/hypokalemia exam. The patient reports the following problems: overall stamina and strength. Health maintenance has been reviewed up to date      Lab Results  Component Value Date   NA 136 09/30/2023   K 3.7 09/30/2023   CO2 24 09/30/2023   GLUCOSE 134 (H) 09/30/2023   BUN 11 09/30/2023   CREATININE 0.85 09/30/2023   CALCIUM 7.8 (L) 09/30/2023   EGFR 70 01/14/2023   GFRNONAA >60 09/30/2023   Lab Results  Component Value Date   CHOL 198 09/27/2023   HDL 42 09/27/2023   LDLCALC 130 (H) 09/27/2023   TRIG 131 09/27/2023   CHOLHDL 4.7 09/27/2023   Lab Results  Component Value Date   TSH 35.582 (H) 09/25/2023   Lab Results  Component Value Date   HGBA1C 7.2 (H) 09/25/2023   Lab Results  Component Value Date   WBC 6.0 09/30/2023   HGB 10.4 (L) 09/30/2023   HCT 29.8 (L) 09/30/2023   MCV 91.4 09/30/2023   PLT 204 09/30/2023   Lab Results  Component Value Date   ALT 22 09/26/2023   AST 26 09/26/2023   ALKPHOS 47 09/26/2023   BILITOT 0.9 09/26/2023   No results found for: "25OHVITD2", "25OHVITD3", "VD25OH"   Review of Systems  Constitutional:  Negative for chills and fever.  HENT:  Negative for drooling, ear discharge, ear pain and sore throat.   Respiratory:  Negative for cough, shortness of breath and wheezing.   Cardiovascular:  Negative for chest pain, palpitations and leg swelling.  Gastrointestinal:  Negative for abdominal pain, blood in stool, constipation, diarrhea and nausea.  Endocrine: Negative for polydipsia.  Genitourinary:  Negative for dysuria, frequency, hematuria and urgency.  Musculoskeletal:  Negative for back pain, myalgias and neck pain.  Skin:  Negative for rash.  Allergic/Immunologic: Negative for environmental  allergies.  Neurological:  Negative for dizziness and headaches.       Over all weakness  Hematological:  Does not bruise/bleed easily.  Psychiatric/Behavioral:  Negative for suicidal ideas. The patient is not nervous/anxious.     Patient Active Problem List   Diagnosis Date Noted   Hypokalemia 09/29/2023   Ventricular tachycardia (HCC) 09/28/2023   Electrolyte abnormality 09/26/2023   Acute pulmonary embolism (HCC) 09/26/2023   Obesity (BMI 30-39.9) 09/26/2023   DVT (deep venous thrombosis) (HCC) 09/26/2023   Weakness 09/25/2023   Dizziness 09/25/2023   Syncope and collapse 09/25/2023   Type 2 diabetes mellitus without complication, without long-term current use of insulin (HCC) 01/18/2018   Chronic low back pain without sciatica 06/08/2017   Essential hypertension 12/08/2016   Adult hypothyroidism 12/08/2016   Arthritis 12/08/2016   Acute anxiety 12/08/2016   Cervical radiculopathy 12/08/2016   Mixed hyperlipidemia 12/08/2016   Special screening for malignant neoplasms, colon    Benign neoplasm of ascending colon    Major depressive disorder with single episode, in partial remission (HCC) 08/20/2015   Erectile dysfunction 06/04/2015   History of prostate cancer 06/04/2015    No Known Allergies  Past Surgical History:  Procedure Laterality Date   CATARACT EXTRACTION W/PHACO Left 06/04/2021   Procedure: CATARACT EXTRACTION PHACO AND INTRAOCULAR LENS PLACEMENT (IOC) LEFT DIABETIC 5.51 01:10.7;  Surgeon: Lockie Mola, MD;  Location: [facility];  Service: Ophthalmology;  Laterality: Left;   COLONOSCOPY  2006   normal   COLONOSCOPY WITH PROPOFOL N/A 09/06/2015   Procedure: COLONOSCOPY WITH PROPOFOL;  Surgeon: Midge Minium, MD;  Location: [facility];  Service: Endoscopy;  Laterality: N/A;   POLYPECTOMY  09/06/2015   Procedure: POLYPECTOMY;  Surgeon: Midge Minium, MD;  Location: [facility];  Service: Endoscopy;;   PROSTATE SURGERY     robotic  prostatectomy  2006    Social History   Tobacco Use   Smoking status: Former    Current packs/day: 1.00    Average packs/day: 1 pack/day for 16.0 years (16.0 ttl pk-yrs)    Types: Cigars, Cigarettes   Smokeless tobacco: Current    Types: Snuff   Tobacco comments:    Quit 2007. Smoking cessation materials not required  Vaping Use   Vaping status: Never Used  Substance Use Topics   Alcohol use: Not Currently    Alcohol/week: 0.0 standard drinks of alcohol    Comment: occasional, 1-2 drinks per year   Drug use: No     Medication list has been reviewed and updated.  Current Meds  Medication Sig   apixaban (ELIQUIS) 5 MG TABS tablet Take 2 tablets (10 mg total) by mouth 2 (two) times daily for 5 days, THEN 1 tablet (5 mg total) 2 (two) times daily.   glimepiride (AMARYL) 2 MG tablet One a day (Patient taking differently: One a day)   levothyroxine (SYNTHROID) 125 MCG tablet Take 1 tablet by mouth once daily   metFORMIN (GLUCOPHAGE) 500 MG tablet Take 1 tablet (500 mg total) by mouth 2 (two) times daily with a meal.   mirabegron ER (MYRBETRIQ) 50 MG TB24 tablet Take 1 tablet (50 mg total) by mouth daily.   spironolactone (ALDACTONE) 25 MG tablet Take 1 tablet (25 mg total) by mouth daily. (Patient taking differently: Take 25 mg by mouth daily.)       10/26/2023    2:47 PM 05/14/2023    3:19 PM 01/14/2023    2:32 PM 12/10/2022    2:14 PM  GAD 7 : Generalized Anxiety Score  Nervous, Anxious, on Edge 0 0 0 0  Control/stop worrying 0 0 0 0  Worry too much - different things 0 0 0 0  Trouble relaxing 0 0 0 0  Restless 0 0 0 0  Easily annoyed or irritable 0 0 0 0  Afraid - awful might happen 0 0 0 0  Total GAD 7 Score 0 0 0 0  Anxiety Difficulty Not difficult at all Not difficult at all Not difficult at all Not difficult at all       10/26/2023    2:46 PM 05/14/2023    3:19 PM 01/14/2023    2:32 PM  Depression screen PHQ 2/9  Decreased Interest 0 0 0  Down, Depressed,  Hopeless 0 0 0  PHQ - 2 Score 0 0 0  Altered sleeping 0 0 0  Tired, decreased energy 0 0 0  Change in appetite 0 0 0  Feeling bad or failure about yourself  0 0 0  Trouble concentrating 0 0 0  Moving slowly or fidgety/restless 0 0 0  Suicidal thoughts 0 0 0  PHQ-9 Score 0 0 0  Difficult doing work/chores Not difficult at all Not difficult at all Not difficult at all    BP Readings from Last 3 Encounters:  10/26/23 138/68  10/01/23 120/77  08/16/23 113/74    Physical Exam  Vitals and nursing note reviewed.  HENT:     Head: Normocephalic.     Right Ear: Tympanic membrane and external ear normal.     Left Ear: Tympanic membrane and external ear normal.     Nose: Nose normal.     Mouth/Throat:     Mouth: Mucous membranes are moist.  Eyes:     General: No scleral icterus.       Right eye: No discharge.        Left eye: No discharge.     Conjunctiva/sclera: Conjunctivae normal.     Pupils: Pupils are equal, round, and reactive to light.  Neck:     Thyroid: No thyromegaly.     Vascular: No JVD.     Trachea: No tracheal deviation.  Cardiovascular:     Rate and Rhythm: Normal rate and regular rhythm.     Heart sounds: Normal heart sounds. No murmur heard.    No friction rub. No gallop.  Pulmonary:     Effort: No respiratory distress.     Breath sounds: Normal breath sounds. No wheezing or rales.  Abdominal:     General: Bowel sounds are normal.     Palpations: Abdomen is soft. There is no mass.     Tenderness: There is no abdominal tenderness. There is no guarding or rebound.  Musculoskeletal:        General: No tenderness. Normal range of motion.     Cervical back: Normal range of motion and neck supple.  Lymphadenopathy:     Cervical: No cervical adenopathy.  Skin:    General: Skin is warm.     Findings: No rash.  Neurological:     Mental Status: He is alert.     Deep Tendon Reflexes: Reflexes are normal and symmetric.     Wt Readings from Last 3 Encounters:   10/26/23 201 lb (91.2 kg)  09/29/23 217 lb 9.5 oz (98.7 kg)  08/16/23 197 lb (89.4 kg)    BP 138/68   Pulse 95   Ht 5\' 7"  (1.702 m)   Wt 201 lb (91.2 kg)   SpO2 95%   BMI 31.48 kg/m   Assessment and Plan:  1. Hypokalemia New onset.  Corrected.  Stable.  Patient is doing well on all medications that he is taking.  He is gradually increasing his strength but is still not at baseline.  We have discussed that it is important that he continue with physical therapy and that we have also discussed future plans for medical care.  Patient has been given instructions that we will proceed with medical care in the future.  For the time being we will check patient's potassium to see current level of control and will adjust supplementation accordingly. - Renal Function Panel    Elizabeth Sauer, MD

## 2023-10-27 ENCOUNTER — Encounter: Payer: Self-pay | Admitting: Family Medicine

## 2023-10-27 DIAGNOSIS — I10 Essential (primary) hypertension: Secondary | ICD-10-CM | POA: Diagnosis not present

## 2023-10-27 DIAGNOSIS — I2699 Other pulmonary embolism without acute cor pulmonale: Secondary | ICD-10-CM | POA: Diagnosis not present

## 2023-10-27 DIAGNOSIS — C61 Malignant neoplasm of prostate: Secondary | ICD-10-CM | POA: Diagnosis not present

## 2023-10-27 DIAGNOSIS — I472 Ventricular tachycardia, unspecified: Secondary | ICD-10-CM | POA: Diagnosis not present

## 2023-10-27 DIAGNOSIS — I82432 Acute embolism and thrombosis of left popliteal vein: Secondary | ICD-10-CM | POA: Diagnosis not present

## 2023-10-27 DIAGNOSIS — E119 Type 2 diabetes mellitus without complications: Secondary | ICD-10-CM | POA: Diagnosis not present

## 2023-10-27 DIAGNOSIS — E039 Hypothyroidism, unspecified: Secondary | ICD-10-CM | POA: Diagnosis not present

## 2023-10-27 DIAGNOSIS — E785 Hyperlipidemia, unspecified: Secondary | ICD-10-CM | POA: Diagnosis not present

## 2023-10-27 DIAGNOSIS — E669 Obesity, unspecified: Secondary | ICD-10-CM | POA: Diagnosis not present

## 2023-10-27 LAB — RENAL FUNCTION PANEL
Albumin: 4.6 g/dL (ref 3.8–4.8)
BUN/Creatinine Ratio: 14 (ref 10–24)
BUN: 15 mg/dL (ref 8–27)
CO2: 20 mmol/L (ref 20–29)
Calcium: 10.2 mg/dL (ref 8.6–10.2)
Chloride: 103 mmol/L (ref 96–106)
Creatinine, Ser: 1.05 mg/dL (ref 0.76–1.27)
Glucose: 142 mg/dL — ABNORMAL HIGH (ref 70–99)
Phosphorus: 3.4 mg/dL (ref 2.8–4.1)
Potassium: 3.9 mmol/L (ref 3.5–5.2)
Sodium: 140 mmol/L (ref 134–144)
eGFR: 73 mL/min/{1.73_m2} (ref >59)

## 2023-10-28 ENCOUNTER — Ambulatory Visit: Payer: Self-pay | Admitting: *Deleted

## 2023-10-28 NOTE — Telephone Encounter (Signed)
  Chief Complaint: Daniel Kirby SW from [facility] services calling to inquire patient status of independent living and management of medications.  Symptoms: na Frequency: na Pertinent Negatives: Patient denies na Disposition: [] ED /[] Urgent Care (no appt availability in office) / [] Appointment(In office/virtual)/ []  [place] [facility]/ [] Home Care/ [] Refused Recommended Disposition /[] [place]/ [x]  Follow-up with PCP Additional Notes:   Please advise if PCP has any concerns regarding patients cognitive state with independent living, medication management or safety issues. Please advise and call back at #[phone], Daniel Kirby , SW.       Reason for Disposition  [1] Caller requesting NON-URGENT health information AND [2] PCP's office is the best resource  Answer Assessment - Initial Assessment Questions 1. REASON FOR CALL or QUESTION: "What is your reason for calling today?" or "How can I best help you?" or "What question do you have that I can help answer?"     Adult Protective services calling to ask if PCP has any concerns or information regarding patient living independently and management and compliance of medications independently .  Protocols used: Information Only Call - No Triage-A-AH

## 2023-10-29 DIAGNOSIS — E039 Hypothyroidism, unspecified: Secondary | ICD-10-CM | POA: Diagnosis not present

## 2023-10-29 DIAGNOSIS — I82432 Acute embolism and thrombosis of left popliteal vein: Secondary | ICD-10-CM | POA: Diagnosis not present

## 2023-10-29 DIAGNOSIS — I2699 Other pulmonary embolism without acute cor pulmonale: Secondary | ICD-10-CM | POA: Diagnosis not present

## 2023-10-29 DIAGNOSIS — I472 Ventricular tachycardia, unspecified: Secondary | ICD-10-CM | POA: Diagnosis not present

## 2023-10-29 DIAGNOSIS — C61 Malignant neoplasm of prostate: Secondary | ICD-10-CM | POA: Diagnosis not present

## 2023-10-29 DIAGNOSIS — I10 Essential (primary) hypertension: Secondary | ICD-10-CM | POA: Diagnosis not present

## 2023-10-29 DIAGNOSIS — E669 Obesity, unspecified: Secondary | ICD-10-CM | POA: Diagnosis not present

## 2023-10-29 DIAGNOSIS — E119 Type 2 diabetes mellitus without complications: Secondary | ICD-10-CM | POA: Diagnosis not present

## 2023-10-29 DIAGNOSIS — E785 Hyperlipidemia, unspecified: Secondary | ICD-10-CM | POA: Diagnosis not present

## 2023-11-02 DIAGNOSIS — E039 Hypothyroidism, unspecified: Secondary | ICD-10-CM | POA: Diagnosis not present

## 2023-11-02 DIAGNOSIS — I472 Ventricular tachycardia, unspecified: Secondary | ICD-10-CM | POA: Diagnosis not present

## 2023-11-02 DIAGNOSIS — I82432 Acute embolism and thrombosis of left popliteal vein: Secondary | ICD-10-CM | POA: Diagnosis not present

## 2023-11-02 DIAGNOSIS — E669 Obesity, unspecified: Secondary | ICD-10-CM | POA: Diagnosis not present

## 2023-11-02 DIAGNOSIS — I10 Essential (primary) hypertension: Secondary | ICD-10-CM | POA: Diagnosis not present

## 2023-11-02 DIAGNOSIS — E785 Hyperlipidemia, unspecified: Secondary | ICD-10-CM | POA: Diagnosis not present

## 2023-11-02 DIAGNOSIS — E119 Type 2 diabetes mellitus without complications: Secondary | ICD-10-CM | POA: Diagnosis not present

## 2023-11-02 DIAGNOSIS — C61 Malignant neoplasm of prostate: Secondary | ICD-10-CM | POA: Diagnosis not present

## 2023-11-02 DIAGNOSIS — I2699 Other pulmonary embolism without acute cor pulmonale: Secondary | ICD-10-CM | POA: Diagnosis not present

## 2023-11-03 NOTE — Telephone Encounter (Signed)
Archie Patten called again this afternoon asking to speak to Dr Yetta Barre about patients well being, and cognitive health. She said she cannot speak to anyone but the provider about this case.   Callback [phone]

## 2023-11-04 NOTE — Telephone Encounter (Signed)
Called Tanya back and left VM informing her to call office and ask to speak to Dr Yetta Barre about this patient. Waiting for call back. Please send call to Dr Yetta Barre when she calls.

## 2023-11-08 DIAGNOSIS — I2699 Other pulmonary embolism without acute cor pulmonale: Secondary | ICD-10-CM | POA: Diagnosis not present

## 2023-11-08 DIAGNOSIS — E669 Obesity, unspecified: Secondary | ICD-10-CM | POA: Diagnosis not present

## 2023-11-08 DIAGNOSIS — E119 Type 2 diabetes mellitus without complications: Secondary | ICD-10-CM | POA: Diagnosis not present

## 2023-11-08 DIAGNOSIS — I10 Essential (primary) hypertension: Secondary | ICD-10-CM | POA: Diagnosis not present

## 2023-11-08 DIAGNOSIS — E785 Hyperlipidemia, unspecified: Secondary | ICD-10-CM | POA: Diagnosis not present

## 2023-11-08 DIAGNOSIS — E039 Hypothyroidism, unspecified: Secondary | ICD-10-CM | POA: Diagnosis not present

## 2023-11-08 DIAGNOSIS — I82432 Acute embolism and thrombosis of left popliteal vein: Secondary | ICD-10-CM | POA: Diagnosis not present

## 2023-11-08 DIAGNOSIS — C61 Malignant neoplasm of prostate: Secondary | ICD-10-CM | POA: Diagnosis not present

## 2023-11-08 DIAGNOSIS — I472 Ventricular tachycardia, unspecified: Secondary | ICD-10-CM | POA: Diagnosis not present

## 2023-11-09 DIAGNOSIS — I82432 Acute embolism and thrombosis of left popliteal vein: Secondary | ICD-10-CM | POA: Diagnosis not present

## 2023-11-09 DIAGNOSIS — E785 Hyperlipidemia, unspecified: Secondary | ICD-10-CM | POA: Diagnosis not present

## 2023-11-09 DIAGNOSIS — E119 Type 2 diabetes mellitus without complications: Secondary | ICD-10-CM | POA: Diagnosis not present

## 2023-11-09 DIAGNOSIS — C61 Malignant neoplasm of prostate: Secondary | ICD-10-CM | POA: Diagnosis not present

## 2023-11-09 DIAGNOSIS — E669 Obesity, unspecified: Secondary | ICD-10-CM | POA: Diagnosis not present

## 2023-11-09 DIAGNOSIS — I10 Essential (primary) hypertension: Secondary | ICD-10-CM | POA: Diagnosis not present

## 2023-11-09 DIAGNOSIS — I472 Ventricular tachycardia, unspecified: Secondary | ICD-10-CM | POA: Diagnosis not present

## 2023-11-09 DIAGNOSIS — E039 Hypothyroidism, unspecified: Secondary | ICD-10-CM | POA: Diagnosis not present

## 2023-11-09 DIAGNOSIS — I2699 Other pulmonary embolism without acute cor pulmonale: Secondary | ICD-10-CM | POA: Diagnosis not present

## 2023-11-17 DIAGNOSIS — I10 Essential (primary) hypertension: Secondary | ICD-10-CM | POA: Diagnosis not present

## 2023-11-17 DIAGNOSIS — I472 Ventricular tachycardia, unspecified: Secondary | ICD-10-CM | POA: Diagnosis not present

## 2023-11-17 DIAGNOSIS — I2699 Other pulmonary embolism without acute cor pulmonale: Secondary | ICD-10-CM | POA: Diagnosis not present

## 2023-11-17 DIAGNOSIS — E669 Obesity, unspecified: Secondary | ICD-10-CM | POA: Diagnosis not present

## 2023-11-17 DIAGNOSIS — I82432 Acute embolism and thrombosis of left popliteal vein: Secondary | ICD-10-CM | POA: Diagnosis not present

## 2023-11-17 DIAGNOSIS — E785 Hyperlipidemia, unspecified: Secondary | ICD-10-CM | POA: Diagnosis not present

## 2023-11-17 DIAGNOSIS — E039 Hypothyroidism, unspecified: Secondary | ICD-10-CM | POA: Diagnosis not present

## 2023-11-17 DIAGNOSIS — E119 Type 2 diabetes mellitus without complications: Secondary | ICD-10-CM | POA: Diagnosis not present

## 2023-11-17 DIAGNOSIS — C61 Malignant neoplasm of prostate: Secondary | ICD-10-CM | POA: Diagnosis not present

## 2023-11-18 DIAGNOSIS — I472 Ventricular tachycardia, unspecified: Secondary | ICD-10-CM | POA: Diagnosis not present

## 2023-11-18 DIAGNOSIS — E119 Type 2 diabetes mellitus without complications: Secondary | ICD-10-CM | POA: Diagnosis not present

## 2023-11-18 DIAGNOSIS — E039 Hypothyroidism, unspecified: Secondary | ICD-10-CM | POA: Diagnosis not present

## 2023-11-18 DIAGNOSIS — I2699 Other pulmonary embolism without acute cor pulmonale: Secondary | ICD-10-CM | POA: Diagnosis not present

## 2023-11-18 DIAGNOSIS — E785 Hyperlipidemia, unspecified: Secondary | ICD-10-CM | POA: Diagnosis not present

## 2023-11-18 DIAGNOSIS — C61 Malignant neoplasm of prostate: Secondary | ICD-10-CM | POA: Diagnosis not present

## 2023-11-18 DIAGNOSIS — I10 Essential (primary) hypertension: Secondary | ICD-10-CM | POA: Diagnosis not present

## 2023-11-18 DIAGNOSIS — E669 Obesity, unspecified: Secondary | ICD-10-CM | POA: Diagnosis not present

## 2023-11-18 DIAGNOSIS — I82432 Acute embolism and thrombosis of left popliteal vein: Secondary | ICD-10-CM | POA: Diagnosis not present

## 2023-11-19 DIAGNOSIS — I472 Ventricular tachycardia, unspecified: Secondary | ICD-10-CM | POA: Diagnosis not present

## 2023-11-19 DIAGNOSIS — E039 Hypothyroidism, unspecified: Secondary | ICD-10-CM | POA: Diagnosis not present

## 2023-11-19 DIAGNOSIS — E119 Type 2 diabetes mellitus without complications: Secondary | ICD-10-CM | POA: Diagnosis not present

## 2023-11-19 DIAGNOSIS — I82432 Acute embolism and thrombosis of left popliteal vein: Secondary | ICD-10-CM | POA: Diagnosis not present

## 2023-11-19 DIAGNOSIS — C61 Malignant neoplasm of prostate: Secondary | ICD-10-CM | POA: Diagnosis not present

## 2023-11-19 DIAGNOSIS — I2699 Other pulmonary embolism without acute cor pulmonale: Secondary | ICD-10-CM | POA: Diagnosis not present

## 2023-11-19 DIAGNOSIS — E669 Obesity, unspecified: Secondary | ICD-10-CM | POA: Diagnosis not present

## 2023-11-19 DIAGNOSIS — E785 Hyperlipidemia, unspecified: Secondary | ICD-10-CM | POA: Diagnosis not present

## 2023-11-19 DIAGNOSIS — I10 Essential (primary) hypertension: Secondary | ICD-10-CM | POA: Diagnosis not present

## 2023-11-22 DIAGNOSIS — E785 Hyperlipidemia, unspecified: Secondary | ICD-10-CM | POA: Diagnosis not present

## 2023-11-22 DIAGNOSIS — C61 Malignant neoplasm of prostate: Secondary | ICD-10-CM | POA: Diagnosis not present

## 2023-11-22 DIAGNOSIS — E039 Hypothyroidism, unspecified: Secondary | ICD-10-CM | POA: Diagnosis not present

## 2023-11-22 DIAGNOSIS — E119 Type 2 diabetes mellitus without complications: Secondary | ICD-10-CM | POA: Diagnosis not present

## 2023-11-22 DIAGNOSIS — I82432 Acute embolism and thrombosis of left popliteal vein: Secondary | ICD-10-CM | POA: Diagnosis not present

## 2023-11-22 DIAGNOSIS — E669 Obesity, unspecified: Secondary | ICD-10-CM | POA: Diagnosis not present

## 2023-11-22 DIAGNOSIS — I472 Ventricular tachycardia, unspecified: Secondary | ICD-10-CM | POA: Diagnosis not present

## 2023-11-22 DIAGNOSIS — I2699 Other pulmonary embolism without acute cor pulmonale: Secondary | ICD-10-CM | POA: Diagnosis not present

## 2023-11-22 DIAGNOSIS — I10 Essential (primary) hypertension: Secondary | ICD-10-CM | POA: Diagnosis not present

## 2023-11-23 ENCOUNTER — Ambulatory Visit: Payer: Self-pay

## 2023-11-23 DIAGNOSIS — I10 Essential (primary) hypertension: Secondary | ICD-10-CM | POA: Diagnosis not present

## 2023-11-23 DIAGNOSIS — I472 Ventricular tachycardia, unspecified: Secondary | ICD-10-CM | POA: Diagnosis not present

## 2023-11-23 DIAGNOSIS — C61 Malignant neoplasm of prostate: Secondary | ICD-10-CM | POA: Diagnosis not present

## 2023-11-23 DIAGNOSIS — I2699 Other pulmonary embolism without acute cor pulmonale: Secondary | ICD-10-CM | POA: Diagnosis not present

## 2023-11-23 DIAGNOSIS — E119 Type 2 diabetes mellitus without complications: Secondary | ICD-10-CM | POA: Diagnosis not present

## 2023-11-23 DIAGNOSIS — E785 Hyperlipidemia, unspecified: Secondary | ICD-10-CM | POA: Diagnosis not present

## 2023-11-23 DIAGNOSIS — I82432 Acute embolism and thrombosis of left popliteal vein: Secondary | ICD-10-CM | POA: Diagnosis not present

## 2023-11-23 DIAGNOSIS — E669 Obesity, unspecified: Secondary | ICD-10-CM | POA: Diagnosis not present

## 2023-11-23 DIAGNOSIS — E039 Hypothyroidism, unspecified: Secondary | ICD-10-CM | POA: Diagnosis not present

## 2023-11-23 NOTE — Telephone Encounter (Signed)
  Chief Complaint: medication assistance  Symptoms: NA Frequency:  Pertinent Negatives: Pt denies any sx Disposition: [] ED /[] Urgent Care (no appt availability in office) / [] Appointment(In office/virtual)/ []  [facility] [facility]/ [] Home Care/ [] Refused Recommended Disposition /[] [facility]/ []  Follow-up with PCP Additional Notes: pt asking if he suppose to take Spironolactone  and if so what is it for. Advised him looks like ED provider prescribed at [facility]. Pt states he was taking for swelling while in hospital. Advised him best to call Cardiology office and ask since he had appt on 10/19/23 but missed appt and they would advise. Pt verbalized understanding, denies having any swelling since being home and hasn't been taking the medication as of yet.   Summary: med management   Pt asked what medication spironolactone  (ALDACTONE ) 25 MG tablet is for. Asking if he needs to continue the medication.  Seeking clinical advice.         Reason for Disposition  [1] Caller has medicine question about med NOT prescribed by PCP AND [2] triager unable to answer question (e.g., compatibility with other med, storage)  Answer Assessment - Initial Assessment Questions 1. NAME of MEDICINE: What medicine(s) are you calling about?     spironolactone  2. QUESTION: What is your question? (e.g., double dose of medicine, side effect)     Pt wanting to know if something he needs to be taking and what for  3. PRESCRIBER: Who prescribed the medicine? Reason: if prescribed by specialist, call should be referred to that group.     ED provider 4. SYMPTOMS: Do you have any symptoms? If Yes, ask: What symptoms are you having?  How bad are the symptoms (e.g., mild, moderate, severe)     NA  Protocols used: Medication Question Call-A-AH

## 2023-11-25 DIAGNOSIS — E669 Obesity, unspecified: Secondary | ICD-10-CM | POA: Diagnosis not present

## 2023-11-25 DIAGNOSIS — I10 Essential (primary) hypertension: Secondary | ICD-10-CM | POA: Diagnosis not present

## 2023-11-25 DIAGNOSIS — C61 Malignant neoplasm of prostate: Secondary | ICD-10-CM | POA: Diagnosis not present

## 2023-11-25 DIAGNOSIS — E119 Type 2 diabetes mellitus without complications: Secondary | ICD-10-CM | POA: Diagnosis not present

## 2023-11-25 DIAGNOSIS — I2699 Other pulmonary embolism without acute cor pulmonale: Secondary | ICD-10-CM | POA: Diagnosis not present

## 2023-11-25 DIAGNOSIS — I472 Ventricular tachycardia, unspecified: Secondary | ICD-10-CM | POA: Diagnosis not present

## 2023-11-25 DIAGNOSIS — I82432 Acute embolism and thrombosis of left popliteal vein: Secondary | ICD-10-CM | POA: Diagnosis not present

## 2023-11-25 DIAGNOSIS — E785 Hyperlipidemia, unspecified: Secondary | ICD-10-CM | POA: Diagnosis not present

## 2023-11-25 DIAGNOSIS — E039 Hypothyroidism, unspecified: Secondary | ICD-10-CM | POA: Diagnosis not present

## 2023-11-28 DIAGNOSIS — E119 Type 2 diabetes mellitus without complications: Secondary | ICD-10-CM | POA: Diagnosis not present

## 2023-11-28 DIAGNOSIS — I82432 Acute embolism and thrombosis of left popliteal vein: Secondary | ICD-10-CM | POA: Diagnosis not present

## 2023-11-28 DIAGNOSIS — E669 Obesity, unspecified: Secondary | ICD-10-CM | POA: Diagnosis not present

## 2023-11-28 DIAGNOSIS — C61 Malignant neoplasm of prostate: Secondary | ICD-10-CM | POA: Diagnosis not present

## 2023-11-28 DIAGNOSIS — I2699 Other pulmonary embolism without acute cor pulmonale: Secondary | ICD-10-CM | POA: Diagnosis not present

## 2023-11-28 DIAGNOSIS — I10 Essential (primary) hypertension: Secondary | ICD-10-CM | POA: Diagnosis not present

## 2023-11-28 DIAGNOSIS — I472 Ventricular tachycardia, unspecified: Secondary | ICD-10-CM | POA: Diagnosis not present

## 2023-11-28 DIAGNOSIS — E785 Hyperlipidemia, unspecified: Secondary | ICD-10-CM | POA: Diagnosis not present

## 2023-11-28 DIAGNOSIS — E039 Hypothyroidism, unspecified: Secondary | ICD-10-CM | POA: Diagnosis not present

## 2023-12-07 ENCOUNTER — Ambulatory Visit: Payer: Medicare PPO | Admitting: Family Medicine

## 2023-12-07 ENCOUNTER — Encounter: Payer: Self-pay | Admitting: Family Medicine

## 2023-12-07 VITALS — BP 100/52 | HR 103 | Ht 67.0 in | Wt 207.0 lb

## 2023-12-07 DIAGNOSIS — E119 Type 2 diabetes mellitus without complications: Secondary | ICD-10-CM | POA: Diagnosis not present

## 2023-12-07 DIAGNOSIS — E782 Mixed hyperlipidemia: Secondary | ICD-10-CM

## 2023-12-07 DIAGNOSIS — Z7984 Long term (current) use of oral hypoglycemic drugs: Secondary | ICD-10-CM

## 2023-12-07 DIAGNOSIS — I10 Essential (primary) hypertension: Secondary | ICD-10-CM | POA: Diagnosis not present

## 2023-12-07 DIAGNOSIS — E039 Hypothyroidism, unspecified: Secondary | ICD-10-CM | POA: Diagnosis not present

## 2023-12-07 MED ORDER — PRAVASTATIN SODIUM 10 MG PO TABS: 10.0000 mg | ORAL_TABLET | Freq: Every day | ORAL | 1 refills | Status: DC

## 2023-12-07 MED ORDER — LOSARTAN POTASSIUM-HCTZ 50-12.5 MG PO TABS: 1.0000 | ORAL_TABLET | Freq: Every day | ORAL | 0 refills | Status: DC

## 2023-12-07 MED ORDER — GLIMEPIRIDE 2 MG PO TABS: ORAL_TABLET | ORAL | 1 refills | Status: DC

## 2023-12-07 MED ORDER — LEVOTHYROXINE SODIUM 125 MCG PO TABS: 125.0000 ug | ORAL_TABLET | Freq: Every day | ORAL | 1 refills | Status: DC

## 2023-12-07 MED ORDER — METFORMIN HCL 500 MG PO TABS: 500.0000 mg | ORAL_TABLET | Freq: Two times a day (BID) | ORAL | 1 refills | Status: DC

## 2023-12-07 NOTE — Progress Notes (Signed)
Date:  12/07/2023   Name:  Daniel Kirby   DOB:  [birth year]   MRN:  [id number]   Chief Complaint: Diabetes, Hypertension, Hyperlipidemia, and hypokalemia  Diabetes He presents for his follow-up diabetic visit. He has type 2 diabetes mellitus. His disease course has been stable. There are no hypoglycemic associated symptoms. Pertinent negatives for hypoglycemia include no headaches, nervousness/anxiousness, sweats or tremors. There are no diabetic associated symptoms. Pertinent negatives for diabetes include no blurred vision, no chest pain, no fatigue, no foot paresthesias, no foot ulcerations, no polydipsia, no polyphagia, no polyuria, no visual change, no weakness and no weight loss. There are no hypoglycemic complications. Symptoms are stable. There are no diabetic complications. Pertinent negatives for diabetic complications include no CVA. There are no known risk factors for coronary artery disease. Current diabetic treatment includes oral agent (dual therapy). His weight is stable. He is following a generally healthy diet. Meal planning includes avoidance of concentrated sweets and carbohydrate counting. He participates in exercise daily.  Hypertension This is a chronic problem. The current episode started more than 1 year ago. The problem has been waxing and waning since onset. The problem is controlled. Pertinent negatives include no anxiety, blurred vision, chest pain, headaches, malaise/fatigue, neck pain, orthopnea, palpitations, peripheral edema, PND, shortness of breath or sweats. There are no associated agents to hypertension. Risk factors for coronary artery disease include dyslipidemia. Past treatments include angiotensin blockers and diuretics. The current treatment provides moderate improvement. There are no compliance problems.  There is no history of CAD/MI or CVA. pulmonary embolism. Identifiable causes of hypertension include a thyroid problem. There is no history of chronic renal  disease, a hypertension causing med or renovascular disease.  Hyperlipidemia This is a chronic problem. The problem is controlled. Recent lipid tests were reviewed and are normal. Exacerbating diseases include diabetes and hypothyroidism. He has no history of chronic renal disease, liver disease, obesity or nephrotic syndrome. There are no known factors aggravating his hyperlipidemia. Pertinent negatives include no chest pain, focal sensory loss, focal weakness, leg pain, myalgias or shortness of breath. Current antihyperlipidemic treatment includes statins. The current treatment provides no improvement of lipids. Risk factors for coronary artery disease include diabetes mellitus, dyslipidemia and hypertension.  Thyroid Problem Presents for follow-up visit. Patient reports no anxiety, cold intolerance, constipation, depressed mood, diaphoresis, diarrhea, dry skin, fatigue, hair loss, heat intolerance, hoarse voice, leg swelling, nail problem, palpitations, tremors, visual change, weight gain or weight loss. The symptoms have been stable. His past medical history is significant for diabetes and hyperlipidemia.    Lab Results  Component Value Date   NA 140 10/26/2023   K 3.9 10/26/2023   CO2 20 10/26/2023   GLUCOSE 142 (H) 10/26/2023   BUN 15 10/26/2023   CREATININE 1.05 10/26/2023   CALCIUM 10.2 10/26/2023   EGFR 73 10/26/2023   GFRNONAA >60 09/30/2023   Lab Results  Component Value Date   CHOL 198 09/27/2023   HDL 42 09/27/2023   LDLCALC 130 (H) 09/27/2023   TRIG 131 09/27/2023   CHOLHDL 4.7 09/27/2023   Lab Results  Component Value Date   TSH 35.582 (H) 09/25/2023   Lab Results  Component Value Date   HGBA1C 7.2 (H) 09/25/2023   Lab Results  Component Value Date   WBC 6.0 09/30/2023   HGB 10.4 (L) 09/30/2023   HCT 29.8 (L) 09/30/2023   MCV 91.4 09/30/2023   PLT 204 09/30/2023   Lab Results  Component Value Date  ALT 22 09/26/2023   AST 26 09/26/2023   ALKPHOS 47  09/26/2023   BILITOT 0.9 09/26/2023   No results found for: "25OHVITD2", "25OHVITD3", "VD25OH"   Review of Systems  Constitutional:  Negative for diaphoresis, fatigue, malaise/fatigue, weight gain and weight loss.  HENT:  Negative for ear discharge, ear pain, hoarse voice and tinnitus.   Eyes:  Negative for blurred vision, photophobia, redness and visual disturbance.  Respiratory:  Negative for cough, chest tightness, shortness of breath and wheezing.   Cardiovascular:  Negative for chest pain, palpitations, orthopnea and PND.  Gastrointestinal:  Negative for blood in stool, constipation and diarrhea.  Endocrine: Negative for cold intolerance, heat intolerance, polydipsia, polyphagia and polyuria.  Genitourinary:  Negative for hematuria.  Musculoskeletal:  Negative for myalgias and neck pain.  Neurological:  Negative for tremors, focal weakness, weakness and headaches.  Psychiatric/Behavioral:  The patient is not nervous/anxious.     Patient Active Problem List   Diagnosis Date Noted   Hypokalemia 09/29/2023   Ventricular tachycardia (HCC) 09/28/2023   Electrolyte abnormality 09/26/2023   Acute pulmonary embolism (HCC) 09/26/2023   Obesity (BMI 30-39.9) 09/26/2023   DVT (deep venous thrombosis) (HCC) 09/26/2023   Weakness 09/25/2023   Dizziness 09/25/2023   Syncope and collapse 09/25/2023   Type 2 diabetes mellitus without complication, without long-term current use of insulin (HCC) 01/18/2018   Chronic low back pain without sciatica 06/08/2017   Essential hypertension 12/08/2016   Adult hypothyroidism 12/08/2016   Arthritis 12/08/2016   Acute anxiety 12/08/2016   Cervical radiculopathy 12/08/2016   Mixed hyperlipidemia 12/08/2016   Special screening for malignant neoplasms, colon    Benign neoplasm of ascending colon    Major depressive disorder with single episode, in partial remission (HCC) 08/20/2015   Erectile dysfunction 06/04/2015   History of prostate cancer  06/04/2015    No Known Allergies  Past Surgical History:  Procedure Laterality Date   CATARACT EXTRACTION W/PHACO Left 06/04/2021   Procedure: CATARACT EXTRACTION PHACO AND INTRAOCULAR LENS PLACEMENT (IOC) LEFT DIABETIC 5.51 01:10.7;  Surgeon: Lockie Mola, MD;  Location: [facility];  Service: Ophthalmology;  Laterality: Left;   COLONOSCOPY  2006   normal   COLONOSCOPY WITH PROPOFOL N/A 09/06/2015   Procedure: COLONOSCOPY WITH PROPOFOL;  Surgeon: Midge Minium, MD;  Location: [facility];  Service: Endoscopy;  Laterality: N/A;   POLYPECTOMY  09/06/2015   Procedure: POLYPECTOMY;  Surgeon: Midge Minium, MD;  Location: [facility];  Service: Endoscopy;;   PROSTATE SURGERY     robotic prostatectomy  2006    Social History   Tobacco Use   Smoking status: Former    Current packs/day: 1.00    Average packs/day: 1 pack/day for 16.0 years (16.0 ttl pk-yrs)    Types: Cigars, Cigarettes   Smokeless tobacco: Current    Types: Snuff   Tobacco comments:    Quit 2007. Smoking cessation materials not required  Vaping Use   Vaping status: Never Used  Substance Use Topics   Alcohol use: Not Currently    Alcohol/week: 0.0 standard drinks of alcohol    Comment: occasional, 1-2 drinks per year   Drug use: No     Medication list has been reviewed and updated.  Current Meds  Medication Sig   apixaban (ELIQUIS) 5 MG TABS tablet Take 2 tablets (10 mg total) by mouth 2 (two) times daily for 5 days, THEN 1 tablet (5 mg total) 2 (two) times daily.   glimepiride (AMARYL) 2 MG tablet One  a day (Patient taking differently: One a day)   levothyroxine (SYNTHROID) 125 MCG tablet Take 1 tablet by mouth once daily   losartan-hydrochlorothiazide (HYZAAR) 100-25 MG tablet Take 1 tablet by mouth daily.   metFORMIN (GLUCOPHAGE) 500 MG tablet Take 1 tablet (500 mg total) by mouth 2 (two) times daily with a meal.   mirabegron ER (MYRBETRIQ) 50 MG TB24 tablet Take 1 tablet (50 mg  total) by mouth daily.   NONFORMULARY OR COMPOUNDED ITEM Trimix (30/1/10)-(Pap/Phent/PGE)  Dosage: Inject 0.5 cc per injection   Vial 1ml  Qty #10 Refills 6  [facility] [phone] Fax [phone]   pravastatin (PRAVACHOL) 10 MG tablet Take 1 tablet by mouth once daily   spironolactone (ALDACTONE) 25 MG tablet Take 1 tablet (25 mg total) by mouth daily. (Patient taking differently: Take 25 mg by mouth daily.)       12/07/2023    2:31 PM 10/26/2023    2:47 PM 05/14/2023    3:19 PM 01/14/2023    2:32 PM  GAD 7 : Generalized Anxiety Score  Nervous, Anxious, on Edge 0 0 0 0  Control/stop worrying 0 0 0 0  Worry too much - different things 0 0 0 0  Trouble relaxing 0 0 0 0  Restless 0 0 0 0  Easily annoyed or irritable 0 0 0 0  Afraid - awful might happen 0 0 0 0  Total GAD 7 Score 0 0 0 0  Anxiety Difficulty Not difficult at all Not difficult at all Not difficult at all Not difficult at all       12/07/2023    2:31 PM 10/26/2023    2:46 PM 05/14/2023    3:19 PM  Depression screen PHQ 2/9  Decreased Interest 0 0 0  Down, Depressed, Hopeless 0 0 0  PHQ - 2 Score 0 0 0  Altered sleeping 0 0 0  Tired, decreased energy 0 0 0  Change in appetite 0 0 0  Feeling bad or failure about yourself  0 0 0  Trouble concentrating 0 0 0  Moving slowly or fidgety/restless 0 0 0  Suicidal thoughts 0 0 0  PHQ-9 Score 0 0 0  Difficult doing work/chores Not difficult at all Not difficult at all Not difficult at all    BP Readings from Last 3 Encounters:  12/07/23 (!) 100/52  10/26/23 138/68  10/01/23 120/77    Physical Exam Vitals and nursing note reviewed.  Constitutional:      Appearance: He is well-developed.  HENT:     Head: Normocephalic and atraumatic.     Right Ear: Tympanic membrane, ear canal and external ear normal.     Left Ear: Tympanic membrane, ear canal and external ear normal.     Nose: Nose normal.     Mouth/Throat:     Dentition: Normal dentition.   Eyes:     General: Lids are normal. No scleral icterus.    Conjunctiva/sclera: Conjunctivae normal.     Pupils: Pupils are equal, round, and reactive to light.  Neck:     Thyroid: No thyromegaly.     Vascular: No carotid bruit, hepatojugular reflux or JVD.     Trachea: No tracheal deviation.  Cardiovascular:     Rate and Rhythm: Normal rate and regular rhythm.     Heart sounds: Normal heart sounds. No murmur heard.    No gallop.  Pulmonary:     Effort: Pulmonary effort is normal.     Breath sounds:  Normal breath sounds. No wheezing, rhonchi or rales.  Abdominal:     General: Bowel sounds are normal. There is no distension.     Palpations: Abdomen is soft. There is no hepatomegaly, splenomegaly or mass.     Tenderness: There is no abdominal tenderness. There is no guarding or rebound.     Hernia: There is no hernia in the left inguinal area.  Musculoskeletal:        General: Normal range of motion.     Cervical back: Normal range of motion and neck supple.  Lymphadenopathy:     Cervical: No cervical adenopathy.  Skin:    General: Skin is warm and dry.     Findings: No rash.  Neurological:     Mental Status: He is alert and oriented to person, place, and time.     Sensory: No sensory deficit.     Deep Tendon Reflexes: Reflexes are normal and symmetric.  Psychiatric:        Mood and Affect: Mood is not anxious or depressed.     Wt Readings from Last 3 Encounters:  12/07/23 207 lb (93.9 kg)  10/26/23 201 lb (91.2 kg)  09/29/23 217 lb 9.5 oz (98.7 kg)    BP (!) 100/52   Pulse (!) 103   Ht 5\' 7"  (1.702 m)   Wt 207 lb (93.9 kg)   SpO2 99%   BMI 32.42 kg/m   Assessment and Plan:  1. Type 2 diabetes mellitus without complication, without long-term current use of insulin (HCC) Chronic.  Controlled.  Stable.  Asymptomatic.  Stable.  As far as examination.  Will continue Amaryl 2 mg once a day and metformin 500 mg twice a day.  Will check A1c for current level of control  as well as evaluate microalbuminuria/creatinine urine ratio.  Will recheck patient in 4 months. - glimepiride (AMARYL) 2 MG tablet; One a day  Dispense: 90 tablet; Refill: 1 - metFORMIN (GLUCOPHAGE) 500 MG tablet; Take 1 tablet (500 mg total) by mouth 2 (two) times daily with a meal.  Dispense: 180 tablet; Refill: 1 - Hemoglobin A1c - Microalbumin / creatinine urine ratio  2. Adult hypothyroidism Chronic.  Controlled.  Stable.  Continue levothyroxine 125 mcg once a day. - levothyroxine (SYNTHROID) 125 MCG tablet; Take 1 tablet (125 mcg total) by mouth daily.  Dispense: 90 tablet; Refill: 1  3. Mixed hyperlipidemia Chronic.  Controlled.  Stable.  Asymptomatic.  Tolerating statin well without myalgias or muscle weakness.  Continue pravastatin 10 mg once a day and will recheck patient in 6 months. - pravastatin (PRAVACHOL) 10 MG tablet; Take 1 tablet (10 mg total) by mouth daily.  Dispense: 90 tablet; Refill: 1  4. Essential hypertension (Primary) Chronic.  Controlled.  Stable.  Patient's blood pressure is low at 100/52.  Patient has little dizziness when he stands up so we will decrease medication to losartan hydrochlorothiazide 50-12.5 mg once a day.  Patient will be returning for blood pressure check and will continue current dosing if concurred by cardiologist.  Meantime we will check CMP for electrolytes and GFR. - Comprehensive metabolic panel - losartan-hydrochlorothiazide (HYZAAR) 50-12.5 MG tablet; Take 1 tablet by mouth daily.  Dispense: 90 tablet; Refill: 0    Elizabeth Sauer, MD

## 2023-12-08 LAB — COMPREHENSIVE METABOLIC PANEL
ALT: 21 [IU]/L (ref 0–44)
AST: 9 [IU]/L (ref 0–40)
Albumin: 4.6 g/dL (ref 3.8–4.8)
Alkaline Phosphatase: 67 [IU]/L (ref 44–121)
BUN/Creatinine Ratio: 13 (ref 10–24)
BUN: 15 mg/dL (ref 8–27)
Bilirubin Total: 0.4 mg/dL (ref 0.0–1.2)
CO2: 23 mmol/L (ref 20–29)
Calcium: 10.2 mg/dL (ref 8.6–10.2)
Chloride: 101 mmol/L (ref 96–106)
Creatinine, Ser: 1.19 mg/dL (ref 0.76–1.27)
Globulin, Total: 2.8 g/dL (ref 1.5–4.5)
Glucose: 118 mg/dL — ABNORMAL HIGH (ref 70–99)
Potassium: 4.4 mmol/L (ref 3.5–5.2)
Sodium: 138 mmol/L (ref 134–144)
Total Protein: 7.4 g/dL (ref 6.0–8.5)
eGFR: 63 mL/min/{1.73_m2} (ref >59)

## 2023-12-08 LAB — HEMOGLOBIN A1C
Est. average glucose Bld gHb Est-mCnc: 126 mg/dL
Hgb A1c MFr Bld: 6 % — ABNORMAL HIGH (ref 4.8–5.6)

## 2023-12-08 LAB — MICROALBUMIN / CREATININE URINE RATIO
Creatinine, Urine: 99.6 mg/dL
Microalb/Creat Ratio: <3 mg/g{creat} (ref 0–29)
Microalbumin, Urine: <3 ug/mL

## 2023-12-09 ENCOUNTER — Encounter: Payer: Self-pay | Admitting: Family Medicine

## 2023-12-09 DIAGNOSIS — I472 Ventricular tachycardia, unspecified: Secondary | ICD-10-CM | POA: Diagnosis not present

## 2023-12-09 DIAGNOSIS — E669 Obesity, unspecified: Secondary | ICD-10-CM | POA: Diagnosis not present

## 2023-12-09 DIAGNOSIS — E039 Hypothyroidism, unspecified: Secondary | ICD-10-CM | POA: Diagnosis not present

## 2023-12-09 DIAGNOSIS — E785 Hyperlipidemia, unspecified: Secondary | ICD-10-CM | POA: Diagnosis not present

## 2023-12-09 DIAGNOSIS — C61 Malignant neoplasm of prostate: Secondary | ICD-10-CM | POA: Diagnosis not present

## 2023-12-09 DIAGNOSIS — I82432 Acute embolism and thrombosis of left popliteal vein: Secondary | ICD-10-CM | POA: Diagnosis not present

## 2023-12-09 DIAGNOSIS — I2699 Other pulmonary embolism without acute cor pulmonale: Secondary | ICD-10-CM | POA: Diagnosis not present

## 2023-12-09 DIAGNOSIS — I10 Essential (primary) hypertension: Secondary | ICD-10-CM | POA: Diagnosis not present

## 2023-12-09 DIAGNOSIS — E119 Type 2 diabetes mellitus without complications: Secondary | ICD-10-CM | POA: Diagnosis not present

## 2023-12-10 ENCOUNTER — Telehealth: Payer: Self-pay

## 2023-12-10 DIAGNOSIS — C61 Malignant neoplasm of prostate: Secondary | ICD-10-CM | POA: Diagnosis not present

## 2023-12-10 DIAGNOSIS — E669 Obesity, unspecified: Secondary | ICD-10-CM | POA: Diagnosis not present

## 2023-12-10 DIAGNOSIS — E119 Type 2 diabetes mellitus without complications: Secondary | ICD-10-CM | POA: Diagnosis not present

## 2023-12-10 DIAGNOSIS — I10 Essential (primary) hypertension: Secondary | ICD-10-CM | POA: Diagnosis not present

## 2023-12-10 DIAGNOSIS — I82432 Acute embolism and thrombosis of left popliteal vein: Secondary | ICD-10-CM | POA: Diagnosis not present

## 2023-12-10 DIAGNOSIS — I472 Ventricular tachycardia, unspecified: Secondary | ICD-10-CM | POA: Diagnosis not present

## 2023-12-10 DIAGNOSIS — I2699 Other pulmonary embolism without acute cor pulmonale: Secondary | ICD-10-CM | POA: Diagnosis not present

## 2023-12-10 DIAGNOSIS — E039 Hypothyroidism, unspecified: Secondary | ICD-10-CM | POA: Diagnosis not present

## 2023-12-10 DIAGNOSIS — E785 Hyperlipidemia, unspecified: Secondary | ICD-10-CM | POA: Diagnosis not present

## 2023-12-10 NOTE — Telephone Encounter (Signed)
Pt called for lab results. Shared provider's note. Pt wanted to verify that new Rx for reduced dosage of Hyzaar was called in. Hyzaar 50-12.5  ordered 12/07/2023 #[id number]  - [facility] [address] - confirmed 12/07/2023 2:54 pm.   Duanne Limerick, MD to Daniel Kirby      12/09/23  7:34 AM All labs are in acceptable range will recheck in 6 months

## 2023-12-13 DIAGNOSIS — E669 Obesity, unspecified: Secondary | ICD-10-CM | POA: Diagnosis not present

## 2023-12-13 DIAGNOSIS — I10 Essential (primary) hypertension: Secondary | ICD-10-CM | POA: Diagnosis not present

## 2023-12-13 DIAGNOSIS — I472 Ventricular tachycardia, unspecified: Secondary | ICD-10-CM | POA: Diagnosis not present

## 2023-12-13 DIAGNOSIS — C61 Malignant neoplasm of prostate: Secondary | ICD-10-CM | POA: Diagnosis not present

## 2023-12-13 DIAGNOSIS — I82432 Acute embolism and thrombosis of left popliteal vein: Secondary | ICD-10-CM | POA: Diagnosis not present

## 2023-12-13 DIAGNOSIS — E119 Type 2 diabetes mellitus without complications: Secondary | ICD-10-CM | POA: Diagnosis not present

## 2023-12-13 DIAGNOSIS — E785 Hyperlipidemia, unspecified: Secondary | ICD-10-CM | POA: Diagnosis not present

## 2023-12-13 DIAGNOSIS — E039 Hypothyroidism, unspecified: Secondary | ICD-10-CM | POA: Diagnosis not present

## 2023-12-13 DIAGNOSIS — I2699 Other pulmonary embolism without acute cor pulmonale: Secondary | ICD-10-CM | POA: Diagnosis not present

## 2023-12-15 DIAGNOSIS — E119 Type 2 diabetes mellitus without complications: Secondary | ICD-10-CM | POA: Diagnosis not present

## 2023-12-15 DIAGNOSIS — E785 Hyperlipidemia, unspecified: Secondary | ICD-10-CM | POA: Diagnosis not present

## 2023-12-15 DIAGNOSIS — I82432 Acute embolism and thrombosis of left popliteal vein: Secondary | ICD-10-CM | POA: Diagnosis not present

## 2023-12-15 DIAGNOSIS — C61 Malignant neoplasm of prostate: Secondary | ICD-10-CM | POA: Diagnosis not present

## 2023-12-15 DIAGNOSIS — I10 Essential (primary) hypertension: Secondary | ICD-10-CM | POA: Diagnosis not present

## 2023-12-15 DIAGNOSIS — E039 Hypothyroidism, unspecified: Secondary | ICD-10-CM | POA: Diagnosis not present

## 2023-12-15 DIAGNOSIS — I472 Ventricular tachycardia, unspecified: Secondary | ICD-10-CM | POA: Diagnosis not present

## 2023-12-15 DIAGNOSIS — E669 Obesity, unspecified: Secondary | ICD-10-CM | POA: Diagnosis not present

## 2023-12-15 DIAGNOSIS — I2699 Other pulmonary embolism without acute cor pulmonale: Secondary | ICD-10-CM | POA: Diagnosis not present

## 2024-01-03 ENCOUNTER — Other Ambulatory Visit: Payer: Self-pay

## 2024-01-03 ENCOUNTER — Telehealth: Payer: Self-pay

## 2024-01-03 NOTE — Telephone Encounter (Signed)
 Pt no showed his cardiology appt. We can send in 30 days but pt will have to see a cardiologist.  KP

## 2024-01-03 NOTE — Telephone Encounter (Signed)
 Called pt left VM to call back. Received refill request for pt. Pt needs to see cardiology. A referral was placed from the hospital.  KP

## 2024-01-18 ENCOUNTER — Ambulatory Visit: Payer: Self-pay | Admitting: Family Medicine

## 2024-01-25 ENCOUNTER — Encounter: Payer: Self-pay | Admitting: Family Medicine

## 2024-01-25 ENCOUNTER — Ambulatory Visit (INDEPENDENT_AMBULATORY_CARE_PROVIDER_SITE_OTHER): Admitting: Family Medicine

## 2024-01-25 VITALS — BP 132/78 | HR 78 | Resp 16 | Ht 67.0 in | Wt 213.6 lb

## 2024-01-25 DIAGNOSIS — E119 Type 2 diabetes mellitus without complications: Secondary | ICD-10-CM

## 2024-01-25 DIAGNOSIS — E039 Hypothyroidism, unspecified: Secondary | ICD-10-CM | POA: Diagnosis not present

## 2024-01-25 DIAGNOSIS — I82432 Acute embolism and thrombosis of left popliteal vein: Secondary | ICD-10-CM

## 2024-01-25 DIAGNOSIS — Z7984 Long term (current) use of oral hypoglycemic drugs: Secondary | ICD-10-CM

## 2024-01-25 DIAGNOSIS — I10 Essential (primary) hypertension: Secondary | ICD-10-CM | POA: Diagnosis not present

## 2024-01-25 DIAGNOSIS — N3941 Urge incontinence: Secondary | ICD-10-CM

## 2024-01-25 DIAGNOSIS — Z91199 Patient's noncompliance with other medical treatment and regimen due to unspecified reason: Secondary | ICD-10-CM

## 2024-01-25 DIAGNOSIS — L603 Nail dystrophy: Secondary | ICD-10-CM

## 2024-01-25 DIAGNOSIS — E782 Mixed hyperlipidemia: Secondary | ICD-10-CM

## 2024-01-25 MED ORDER — METFORMIN HCL 500 MG PO TABS: 500.0000 mg | ORAL_TABLET | Freq: Two times a day (BID) | ORAL | 1 refills | Status: DC

## 2024-01-25 MED ORDER — LEVOTHYROXINE SODIUM 125 MCG PO TABS: 125.0000 ug | ORAL_TABLET | Freq: Every day | ORAL | 1 refills | Status: DC

## 2024-01-25 MED ORDER — GLIMEPIRIDE 2 MG PO TABS: ORAL_TABLET | ORAL | 1 refills | Status: DC

## 2024-01-25 MED ORDER — PRAVASTATIN SODIUM 10 MG PO TABS: 10.0000 mg | ORAL_TABLET | Freq: Every day | ORAL | 1 refills | Status: DC

## 2024-01-25 MED ORDER — LOSARTAN POTASSIUM-HCTZ 50-12.5 MG PO TABS: 1.0000 | ORAL_TABLET | Freq: Every day | ORAL | 0 refills | Status: DC

## 2024-01-25 NOTE — Progress Notes (Unsigned)
 Date:  01/25/2024   Name:  Daniel Kirby   DOB:  1945/12/18   MRN:  [id number]   Chief Complaint: Hypertension (DM), Diabetes Mellitus, and Hypothyroidism  Hypertension This is a chronic problem. The current episode started more than 1 year ago. The problem has been gradually improving since onset. The problem is controlled. Pertinent negatives include no anxiety, blurred vision, chest pain, headaches, malaise/fatigue, neck pain, orthopnea, palpitations, peripheral edema, PND, shortness of breath or sweats. There are no associated agents to hypertension. Past treatments include angiotensin blockers and diuretics. The current treatment provides moderate improvement. There are no compliance problems.  There is no history of angina, kidney disease, CAD/MI, CVA or left ventricular hypertrophy. Identifiable causes of hypertension include chronic renal disease and a thyroid problem.  Diabetes He presents for his follow-up diabetic visit. He has type 2 diabetes mellitus. Pertinent negatives for hypoglycemia include no headaches, nervousness/anxiousness, sweats or tremors. Pertinent negatives for diabetes include no blurred vision, no chest pain, no fatigue, no polydipsia, no polyuria, no visual change and no weight loss. There are no hypoglycemic complications. Pertinent negatives for diabetic complications include no CVA. Current diabetic treatment includes oral agent (dual therapy). His weight is stable. He is following a generally unhealthy diet. Meal planning includes avoidance of concentrated sweets and carbohydrate counting. He rarely participates in exercise. An ACE inhibitor/angiotensin II receptor blocker is being taken.  Hyperlipidemia The problem is controlled. Exacerbating diseases include chronic renal disease and diabetes. Pertinent negatives include no chest pain, leg pain or shortness of breath. Current antihyperlipidemic treatment includes statins. The current treatment provides moderate  improvement of lipids. Risk factors for coronary artery disease include diabetes mellitus, dyslipidemia and hypertension.  Thyroid Problem Presents for follow-up visit. Patient reports no anxiety, cold intolerance, constipation, depressed mood, diaphoresis, diarrhea, dry skin, fatigue, hair loss, heat intolerance, hoarse voice, leg swelling, menstrual problem, nail problem, palpitations, tremors, visual change, weight gain or weight loss. The symptoms have been stable. His past medical history is significant for diabetes and hyperlipidemia.    Lab Results  Component Value Date   NA 138 12/07/2023   K 4.4 12/07/2023   CO2 23 12/07/2023   GLUCOSE 118 (H) 12/07/2023   BUN 15 12/07/2023   CREATININE 1.19 12/07/2023   CALCIUM 10.2 12/07/2023   EGFR 63 12/07/2023   GFRNONAA >60 09/30/2023   Lab Results  Component Value Date   CHOL 198 09/27/2023   HDL 42 09/27/2023   LDLCALC 130 (H) 09/27/2023   TRIG 131 09/27/2023   CHOLHDL 4.7 09/27/2023   Lab Results  Component Value Date   TSH 35.582 (H) 09/25/2023   Lab Results  Component Value Date   HGBA1C 6.0 (H) 12/07/2023   Lab Results  Component Value Date   WBC 6.0 09/30/2023   HGB 10.4 (L) 09/30/2023   HCT 29.8 (L) 09/30/2023   MCV 91.4 09/30/2023   PLT 204 09/30/2023   Lab Results  Component Value Date   ALT 21 12/07/2023   AST 9 12/07/2023   ALKPHOS 67 12/07/2023   BILITOT 0.4 12/07/2023   No results found for: "25OHVITD2", "25OHVITD3", "VD25OH"   Review of Systems  Constitutional:  Negative for diaphoresis, fatigue, malaise/fatigue, weight gain and weight loss.  HENT:  Negative for hoarse voice.   Eyes:  Negative for blurred vision.  Respiratory:  Negative for cough, chest tightness, shortness of breath, wheezing and stridor.   Cardiovascular:  Positive for leg swelling. Negative for chest pain, palpitations, orthopnea  and PND.  Gastrointestinal:  Negative for constipation and diarrhea.  Endocrine: Negative for cold  intolerance, heat intolerance, polydipsia and polyuria.  Genitourinary:  Negative for menstrual problem.  Musculoskeletal:  Negative for neck pain.  Neurological:  Negative for tremors and headaches.  Psychiatric/Behavioral:  The patient is not nervous/anxious.     Patient Active Problem List   Diagnosis Date Noted   Hypokalemia 09/29/2023   Ventricular tachycardia (HCC) 09/28/2023   Electrolyte abnormality 09/26/2023   Acute pulmonary embolism (HCC) 09/26/2023   Obesity (BMI 30-39.9) 09/26/2023   DVT (deep venous thrombosis) (HCC) 09/26/2023   Weakness 09/25/2023   Dizziness 09/25/2023   Syncope and collapse 09/25/2023   Type 2 diabetes mellitus without complication, without long-term current use of insulin (HCC) 01/18/2018   Chronic low back pain without sciatica 06/08/2017   Essential hypertension 12/08/2016   Adult hypothyroidism 12/08/2016   Arthritis 12/08/2016   Acute anxiety 12/08/2016   Cervical radiculopathy 12/08/2016   Mixed hyperlipidemia 12/08/2016   Special screening for malignant neoplasms, colon    Benign neoplasm of ascending colon    Major depressive disorder with single episode, in partial remission (HCC) 08/20/2015   Erectile dysfunction 06/04/2015   History of prostate cancer 06/04/2015    No Known Allergies  Past Surgical History:  Procedure Laterality Date   CATARACT EXTRACTION W/PHACO Left 06/04/2021   Procedure: CATARACT EXTRACTION PHACO AND INTRAOCULAR LENS PLACEMENT (IOC) LEFT DIABETIC 5.51 01:10.7;  Surgeon: Lockie Mola, MD;  Location: [facility];  Service: Ophthalmology;  Laterality: Left;   COLONOSCOPY  2006   normal   COLONOSCOPY WITH PROPOFOL N/A 09/06/2015   Procedure: COLONOSCOPY WITH PROPOFOL;  Surgeon: Midge Minium, MD;  Location: [facility];  Service: Endoscopy;  Laterality: N/A;   POLYPECTOMY  09/06/2015   Procedure: POLYPECTOMY;  Surgeon: Midge Minium, MD;  Location: [facility];  Service: Endoscopy;;    PROSTATE SURGERY     robotic prostatectomy  2006    Social History   Tobacco Use   Smoking status: Former    Current packs/day: 1.00    Average packs/day: 1 pack/day for 16.0 years (16.0 ttl pk-yrs)    Types: Cigars, Cigarettes   Smokeless tobacco: Current    Types: Snuff   Tobacco comments:    Quit 2007. Smoking cessation materials not required  Vaping Use   Vaping status: Never Used  Substance Use Topics   Alcohol use: Not Currently    Alcohol/week: 0.0 standard drinks of alcohol    Comment: occasional, 1-2 drinks per year   Drug use: No     Medication list has been reviewed and updated.  Current Meds  Medication Sig   glimepiride (AMARYL) 2 MG tablet One a day   levothyroxine (SYNTHROID) 125 MCG tablet Take 1 tablet (125 mcg total) by mouth daily.   losartan-hydrochlorothiazide (HYZAAR) 50-12.5 MG tablet Take 1 tablet by mouth daily.   metFORMIN (GLUCOPHAGE) 500 MG tablet Take 1 tablet (500 mg total) by mouth 2 (two) times daily with a meal.   mirabegron ER (MYRBETRIQ) 50 MG TB24 tablet Take 1 tablet (50 mg total) by mouth daily.   NONFORMULARY OR COMPOUNDED ITEM Trimix (30/1/10)-(Pap/Phent/PGE)  Dosage: Inject 0.5 cc per injection   Vial 1ml  Qty #10 Refills 6  [facility] [phone] Fax [phone]   pravastatin (PRAVACHOL) 10 MG tablet Take 1 tablet (10 mg total) by mouth daily.   spironolactone (ALDACTONE) 25 MG tablet Take 1 tablet (25 mg total) by mouth daily. (Patient  taking differently: Take 25 mg by mouth daily.)       01/25/2024    2:27 PM 12/07/2023    2:31 PM 10/26/2023    2:47 PM 05/14/2023    3:19 PM  GAD 7 : Generalized Anxiety Score  Nervous, Anxious, on Edge 0 0 0 0  Control/stop worrying 0 0 0 0  Worry too much - different things 0 0 0 0  Trouble relaxing 0 0 0 0  Restless 0 0 0 0  Easily annoyed or irritable 0 0 0 0  Afraid - awful might happen  0 0 0  Total GAD 7 Score  0 0 0  Anxiety Difficulty  Not difficult at all  Not difficult at all Not difficult at all       01/25/2024    2:27 PM 12/07/2023    2:31 PM 10/26/2023    2:46 PM  Depression screen PHQ 2/9  Decreased Interest 0 0 0  Down, Depressed, Hopeless 0 0 0  PHQ - 2 Score 0 0 0  Altered sleeping  0 0  Tired, decreased energy  0 0  Change in appetite  0 0  Feeling bad or failure about yourself   0 0  Trouble concentrating  0 0  Moving slowly or fidgety/restless  0 0  Suicidal thoughts  0 0  PHQ-9 Score  0 0  Difficult doing work/chores  Not difficult at all Not difficult at all    BP Readings from Last 3 Encounters:  01/25/24 132/78  12/07/23 (!) 100/52  10/26/23 138/68    Physical Exam Vitals and nursing note reviewed.  Constitutional:      Appearance: He is well-developed.  HENT:     Head: Normocephalic and atraumatic.     Right Ear: Tympanic membrane, ear canal and external ear normal.     Left Ear: Tympanic membrane, ear canal and external ear normal.     Nose: Nose normal. No congestion or rhinorrhea.     Mouth/Throat:     Dentition: Normal dentition.  Eyes:     General: Lids are normal. No scleral icterus.    Conjunctiva/sclera: Conjunctivae normal.     Pupils: Pupils are equal, round, and reactive to light.  Neck:     Thyroid: No thyromegaly.     Vascular: No carotid bruit, hepatojugular reflux or JVD.     Trachea: No tracheal deviation.  Cardiovascular:     Rate and Rhythm: Normal rate and regular rhythm.     Heart sounds: Normal heart sounds.  Pulmonary:     Effort: Pulmonary effort is normal.     Breath sounds: Normal breath sounds.  Abdominal:     General: Bowel sounds are normal.     Palpations: Abdomen is soft. There is no hepatomegaly, splenomegaly or mass.     Tenderness: There is no abdominal tenderness.     Hernia: There is no hernia in the left inguinal area.  Musculoskeletal:        General: Normal range of motion.     Cervical back: Normal range of motion and neck supple.  Lymphadenopathy:      Cervical: No cervical adenopathy.  Skin:    General: Skin is warm and dry.     Findings: No rash.  Neurological:     Mental Status: He is alert and oriented to person, place, and time.     Sensory: No sensory deficit.     Deep Tendon Reflexes: Reflexes are normal and symmetric.  Psychiatric:        Mood and Affect: Mood is not anxious or depressed.     Wt Readings from Last 3 Encounters:  01/25/24 213 lb 9.6 oz (96.9 kg)  12/07/23 207 lb (93.9 kg)  10/26/23 201 lb (91.2 kg)    BP 132/78   Pulse 78   Resp 16   Ht 5\' 7"  (1.702 m)   Wt 213 lb 9.6 oz (96.9 kg)   SpO2 98%   BMI 33.45 kg/m   Assessment and Plan: 1. Type 2 diabetes mellitus without complication, without long-term current use of insulin (HCC) (Primary) Chronic.  Controlled.  Stable.  Asymptomatic.  Tolerating medications well.  Continue glimepiride 2 mg once a day and metformin 500 mg twice a day.  Review of previous A1c X acceptable will recheck in 4 months. - glimepiride (AMARYL) 2 MG tablet; One a day  Dispense: 90 tablet; Refill: 1 - metFORMIN (GLUCOPHAGE) 500 MG tablet; Take 1 tablet (500 mg total) by mouth 2 (two) times daily with a meal.  Dispense: 180 tablet; Refill: 1  2. Adult hypothyroidism Chronic.  Controlled.  Stable.  TSH will be obtained and pending results action will be taken if necessary.. - TSH  3. Essential hypertension Chronic.  Controlled.  Stable.  Blood pressure 132/78.  Asymptomatic.  Tolerating medications well.  Continue losartan hydrochlorothiazide 50-12.5 mg once a day.  Will check CMP for electrolytes and GFR.  Will recheck patient in 6 months. - losartan-hydrochlorothiazide (HYZAAR) 50-12.5 MG tablet; Take 1 tablet by mouth daily.  Dispense: 90 tablet; Refill: 0 - Comprehensive metabolic panel  4. Urge incontinence of urine Relatively stable.  Chronic.  Controlled.  Will continue to observe.  5. Mixed hyperlipidemia Chronic.  Controlled.  Stable.  Asymptomatic.  Tolerating  current treatment with statin/pravastatin 10 mg once a day.  Patient denies any myalgias or muscle weakness will continue at current dosing will check lipid for LDL level of control. - pravastatin (PRAVACHOL) 10 MG tablet; Take 1 tablet (10 mg total) by mouth daily.  Dispense: 90 tablet; Refill: 1 - Comprehensive metabolic panel - Lipid Panel With LDL/HDL Ratio  6. Noncompliance Patient has difficulty with taking medication we have reemphasized the importance of taking them particularly his Eliquis although we are outside the window that I think that they intended for him to continue we will still ask vein and vascular for final say whether or not this needs to be reevaluated before cessation of Eliquis.  7. Acute deep vein thrombosis (DVT) of popliteal vein of left lower extremity (HCC) New onset.  Perhaps persistent/may be chronic.  Controlled without symptoms.  Patient is still has swelling of the leg which may be due to either retained clot or redevelopment of clot since patient is pretty much sedentary and or venous insufficiency due to previous involvement of DVT and venous valvular system.  Will refer to vascular surgery for follow-up and determination if Eliquis needs to be continued - Ambulatory referral to Vascular Surgery  8. Dystrophia unguium Chronic.  But brought to our attention first time by caretaker.  Will refer to podiatry since patient is diabetic for evaluation and treatment. - Ambulatory referral to Podiatry     Elizabeth Sauer, MD

## 2024-01-26 ENCOUNTER — Other Ambulatory Visit: Payer: Self-pay | Admitting: Family Medicine

## 2024-01-26 ENCOUNTER — Encounter: Payer: Self-pay | Admitting: Family Medicine

## 2024-01-26 LAB — LIPID PANEL WITH LDL/HDL RATIO
Cholesterol, Total: 100 mg/dL (ref 100–199)
HDL: 33 mg/dL — ABNORMAL LOW (ref >39)
LDL Chol Calc (NIH): 43 mg/dL (ref 0–99)
LDL/HDL Ratio: 1.3 ratio (ref 0.0–3.6)
Triglycerides: 138 mg/dL (ref 0–149)
VLDL Cholesterol Cal: 24 mg/dL (ref 5–40)

## 2024-01-26 LAB — COMPREHENSIVE METABOLIC PANEL
ALT: 19 IU/L (ref 0–44)
AST: 8 IU/L (ref 0–40)
Albumin: 4.4 g/dL (ref 3.8–4.8)
Alkaline Phosphatase: 77 IU/L (ref 44–121)
BUN/Creatinine Ratio: 7 — ABNORMAL LOW (ref 10–24)
BUN: 9 mg/dL (ref 8–27)
Bilirubin Total: 0.3 mg/dL (ref 0.0–1.2)
CO2: 21 mmol/L (ref 20–29)
Calcium: 10.1 mg/dL (ref 8.6–10.2)
Chloride: 102 mmol/L (ref 96–106)
Creatinine, Ser: 1.26 mg/dL (ref 0.76–1.27)
Globulin, Total: 2.8 g/dL (ref 1.5–4.5)
Glucose: 101 mg/dL — ABNORMAL HIGH (ref 70–99)
Potassium: 4.7 mmol/L (ref 3.5–5.2)
Sodium: 141 mmol/L (ref 134–144)
Total Protein: 7.2 g/dL (ref 6.0–8.5)
eGFR: 58 mL/min/{1.73_m2} — ABNORMAL LOW (ref >59)

## 2024-01-26 LAB — TSH: TSH: 0.212 u[IU]/mL — ABNORMAL LOW (ref 0.450–4.500)

## 2024-01-26 MED ORDER — LEVOTHYROXINE SODIUM 112 MCG PO TABS: 112.0000 ug | ORAL_TABLET | Freq: Every day | ORAL | 0 refills | Status: DC

## 2024-02-17 ENCOUNTER — Telehealth: Payer: Self-pay | Admitting: Family Medicine

## 2024-02-17 NOTE — Telephone Encounter (Signed)
 Copied from CRM [id number]. Topic: Medicare AWV >> Feb 17, 2024  1:54 PM Payton Doughty wrote: Reason for CRM: Called LVM 02/17/2024 to schedule AWV. Please schedule Virtual or Telehealth visits ONLY.   Verlee Rossetti; Care Guide Ambulatory [facility] Support [facility] Direct Dial: [phone]

## 2024-03-05 ENCOUNTER — Other Ambulatory Visit: Payer: Self-pay | Admitting: Family Medicine

## 2024-03-05 DIAGNOSIS — I10 Essential (primary) hypertension: Secondary | ICD-10-CM

## 2024-04-03 ENCOUNTER — Other Ambulatory Visit (INDEPENDENT_AMBULATORY_CARE_PROVIDER_SITE_OTHER): Payer: Self-pay | Admitting: Nurse Practitioner

## 2024-04-03 ENCOUNTER — Encounter (INDEPENDENT_AMBULATORY_CARE_PROVIDER_SITE_OTHER): Payer: Self-pay

## 2024-04-03 DIAGNOSIS — I82432 Acute embolism and thrombosis of left popliteal vein: Secondary | ICD-10-CM

## 2024-04-04 ENCOUNTER — Ambulatory Visit (INDEPENDENT_AMBULATORY_CARE_PROVIDER_SITE_OTHER): Payer: Self-pay | Admitting: Vascular Surgery

## 2024-04-04 ENCOUNTER — Ambulatory Visit (INDEPENDENT_AMBULATORY_CARE_PROVIDER_SITE_OTHER): Payer: Self-pay

## 2024-04-04 ENCOUNTER — Encounter (INDEPENDENT_AMBULATORY_CARE_PROVIDER_SITE_OTHER): Payer: Self-pay

## 2024-04-04 VITALS — BP 127/78 | HR 88 | Resp 16

## 2024-04-04 DIAGNOSIS — I82432 Acute embolism and thrombosis of left popliteal vein: Secondary | ICD-10-CM

## 2024-04-04 DIAGNOSIS — E782 Mixed hyperlipidemia: Secondary | ICD-10-CM | POA: Diagnosis not present

## 2024-04-04 DIAGNOSIS — I10 Essential (primary) hypertension: Secondary | ICD-10-CM

## 2024-04-04 MED ORDER — APIXABAN 5 MG PO TABS: 5.0000 mg | ORAL_TABLET | Freq: Two times a day (BID) | ORAL | 6 refills | Status: AC

## 2024-04-04 NOTE — Progress Notes (Signed)
 Subjective:    Patient ID: Daniel Kirby, male    DOB: [birth year], 78 y.o.   MRN: [id number] Chief Complaint  Patient presents with   Establish Care    On 09/25/2023 patient presented to [facility] emergency department after having several weeks of weakness and lightheadedness with syncope and difficulties with ambulation.  He was also found to have lower extremity swelling.  Upon workup he was noted to have bilateral pulmonary embolisms with left lower popliteal DVT.  CAT scan did not show any evidence of right heart strain.  Vascular surgery was not consulted to see the patient at this time.  Patient was discharged on Eliquis  5 mg twice daily.  He was given 3 month prescription for the medication.  He took Eliquis  for those 3 months.  When the medication ran out he states that his primary care would not refill the medication.  He is now 6 months post the original diagnosis of bilateral pulmonary embolisms with DVT.  Patient states he was sent to vascular surgery for evaluation and whether or not to continue to his anticoagulation and for how long.    Review of Systems  Constitutional: Negative.   Respiratory: Negative.         November 2024 patient was diagnosed with bilateral pulmonary embolisms without right heart strain.  Started on anticoagulation at this time.  Denies any shortness of breath today.  Musculoskeletal: Negative.   Skin: Negative.   Psychiatric/Behavioral: Negative.    All other systems reviewed and are negative.      Objective:    Physical Exam Vitals reviewed.  Constitutional:      Appearance: Normal appearance. He is obese.  HENT:     Head: Normocephalic.  Eyes:     Pupils: Pupils are equal, round, and reactive to light.  Cardiovascular:     Rate and Rhythm: Normal rate and regular rhythm.     Pulses: Normal pulses.     Heart sounds: Normal heart sounds.  Pulmonary:     Effort: Pulmonary effort is normal.     Breath sounds: Normal breath sounds.   Abdominal:     General: Abdomen is flat. Bowel sounds are normal.     Palpations: Abdomen is soft.  Musculoskeletal:        General: Normal range of motion.  Skin:    General: Skin is warm and dry.     Capillary Refill: Capillary refill takes 2 to 3 seconds.  Neurological:     General: No focal deficit present.     Mental Status: He is alert and oriented to person, place, and time. Mental status is at baseline.  Psychiatric:        Mood and Affect: Mood normal.        Behavior: Behavior normal.        Thought Content: Thought content normal.        Judgment: Judgment normal.     BP 127/78 (BP Location: Left Arm, Patient Position: Sitting, Cuff Size: Large)   Pulse 88   Resp 16   Past Medical History:  Diagnosis Date   Abdominal pain    RUQ   Arthritis    right ankle   ED (erectile dysfunction)    HLD (hyperlipidemia)    Hypertension    Hypothyroid    Prostate cancer (HCC)    Vertigo    1 episode only, approx 1 month ago    Social History   Socioeconomic History   Marital status: Teacher, English as a foreign language  Separated    Spouse name: Not on file   Number of children: 3   Years of education: Not on file   Highest education level: 12th grade  Occupational History   Occupation: Retired  Tobacco Use   Smoking status: Former    Current packs/day: 1.00    Average packs/day: 1 pack/day for 16.0 years (16.0 ttl pk-yrs)    Types: Cigars, Cigarettes   Smokeless tobacco: Current    Types: Snuff   Tobacco comments:    Quit 2007. Smoking cessation materials not required  Vaping Use   Vaping status: Never Used  Substance and Sexual Activity   Alcohol use: Not Currently    Alcohol/week: 0.0 standard drinks of alcohol    Comment: occasional, 1-2 drinks per year   Drug use: No   Sexual activity: Yes  Other Topics Concern   Not on file  Social History Narrative   Lives alone.    Social Drivers of Health   Financial Resource Strain: Medium Risk (03/07/2024)   Received from [facility]   Overall Financial Resource Strain (CARDIA)    Difficulty of Paying Living Expenses: Somewhat hard  Food Insecurity: No Food Insecurity (03/07/2024)   Received from [facility]   Hunger Vital Sign    Worried About Running Out of Food in the Last Year: Never true    Ran Out of Food in the Last Year: Never true  Transportation Needs: No Transportation Needs (03/07/2024)   Received from [facility] - Transportation    In the past 12 months, has lack of transportation kept you from medical appointments or from getting medications?: No    Lack of Transportation (Non-Medical): No  Physical Activity: Inactive (11/23/2022)   Exercise Vital Sign    Days of Exercise per Week: 0 days    Minutes of Exercise per Session: 0 min  Stress: No Stress Concern Present (11/23/2022)   [employer] of Occupational Health - Occupational Stress Questionnaire    Feeling of Stress : Not at all  Social Connections: Socially Isolated (11/23/2022)   Social Connection and Isolation Panel [NHANES]    Frequency of Communication with Friends and Family: More than three times a week    Frequency of Social Gatherings with Friends and Family: Once a week    Attends Religious Services: Never    Database administrator or Organizations: No    Attends Banker Meetings: Never    Marital Status: Separated  Intimate Partner Violence: Not At Risk (11/23/2022)   Humiliation, Afraid, Rape, and Kick questionnaire    Fear of Current or Ex-Partner: No    Emotionally Abused: No    Physically Abused: No    Sexually Abused: No    Past Surgical History:  Procedure Laterality Date   CATARACT EXTRACTION W/PHACO Left 06/04/2021   Procedure: CATARACT EXTRACTION PHACO AND INTRAOCULAR LENS PLACEMENT (IOC) LEFT DIABETIC 5.51 01:10.7;  Surgeon: Annell Kidney, MD;  Location: [facility];  Service: Ophthalmology;  Laterality: Left;   COLONOSCOPY   2006   normal   COLONOSCOPY WITH PROPOFOL  N/A 09/06/2015   Procedure: COLONOSCOPY WITH PROPOFOL ;  Surgeon: Marnee Sink, MD;  Location: [facility];  Service: Endoscopy;  Laterality: N/A;   POLYPECTOMY  09/06/2015   Procedure: POLYPECTOMY;  Surgeon: Marnee Sink, MD;  Location: [facility];  Service: Endoscopy;;   PROSTATE SURGERY     robotic prostatectomy  2006    Family History  Problem Relation Age of Onset   Heart attack Father    Diabetes Mother    Kidney disease Brother     No Known Allergies     Latest Ref Rng & Units 09/30/2023    5:41 AM 09/29/2023    4:49 AM 09/28/2023    3:56 AM  CBC  WBC 4.0 - 10.5 K/uL 6.0  6.6  6.7   Hemoglobin 13.0 - 17.0 g/dL 01.0  27.2  53.6   Hematocrit 39.0 - 52.0 % 29.8  32.3  32.2   Platelets 150 - 400 K/uL 204  270  276        CMP     Component Value Date/Time   NA 141 01/25/2024 1521   K 4.7 01/25/2024 1521   CL 102 01/25/2024 1521   CO2 21 01/25/2024 1521   GLUCOSE 101 (H) 01/25/2024 1521   GLUCOSE 134 (H) 09/30/2023 0541   BUN 9 01/25/2024 1521   CREATININE 1.26 01/25/2024 1521   CALCIUM 10.1 01/25/2024 1521   PROT 7.2 01/25/2024 1521   ALBUMIN 4.4 01/25/2024 1521   AST 8 01/25/2024 1521   ALT 19 01/25/2024 1521   ALKPHOS 77 01/25/2024 1521   BILITOT 0.3 01/25/2024 1521   EGFR 58 (L) 01/25/2024 1521   GFRNONAA >60 09/30/2023 0541     No results found.     Assessment & Plan:   1. Acute deep vein thrombosis (DVT) of popliteal vein of left lower extremity (HCC) (Primary) On 09/25/2023 patient presented to [facility] emergency department after having several weeks of weakness and lightheadedness with syncope and difficulties with ambulation.  He was also found to have lower extremity swelling.  Upon workup he was noted to have bilateral pulmonary embolisms with left lower popliteal DVT.  CAT scan did not show any evidence of right heart strain.  Vascular surgery was not consulted to see the patient at this  time.  Patient was discharged on Eliquis  5 mg twice daily.  He was given 3 month prescription for the medication.  When the medication ran out he states that his primary care would not refill the medication.  He is now 6 months post the original diagnosis of bilateral pulmonary embolisms with DVT.  Ultrasound today shows that the patient no longer has a left lower extremity DVT but vascular surgery recommends that the patient continue to be on Eliquis  for 1 year.  Therefore we have reordered his Eliquis  with refills today and asked him to restart this soon as he gets it.  Patient's son is with him at the visit today.  They both verbalized her understanding and will restart Eliquis .  Patient will follow-up in 6 months with bilateral lower venous ultrasounds to check for DVT.  2. Essential hypertension Continue antihypertensive medications as already ordered, these medications have been reviewed and there are no changes at this time.  3. Mixed hyperlipidemia Continue statin as ordered and reviewed, no changes at this time  Current Outpatient Medications on File Prior to Visit  Medication Sig Dispense Refill   glimepiride  (AMARYL ) 2 MG tablet One a day 90 tablet 1   levothyroxine  (SYNTHROID ) 112 MCG tablet Take 1 tablet (112 mcg total) by mouth daily. 90 tablet 0   losartan -hydrochlorothiazide  (HYZAAR) 50-12.5 MG tablet Take 1 tablet by mouth once daily 90 tablet 0   metFORMIN  (GLUCOPHAGE ) 500 MG tablet Take 1 tablet (500 mg total) by mouth 2 (two) times daily with a meal. 180 tablet 1   mirabegron  ER (MYRBETRIQ )  50 MG TB24 tablet Take 1 tablet (50 mg total) by mouth daily. 90 tablet 3   pravastatin  (PRAVACHOL ) 10 MG tablet Take 1 tablet (10 mg total) by mouth daily. 90 tablet 1   NONFORMULARY OR COMPOUNDED ITEM Trimix (30/1/10)-(Pap/Phent/PGE)  Dosage: Inject 0.5 cc per injection   Vial 1ml  Qty #10 Refills 6  [facility] [phone] Fax [phone] (Patient not taking: Reported  on 04/04/2024) 10 each 6   spironolactone  (ALDACTONE ) 25 MG tablet Take 1 tablet (25 mg total) by mouth daily. (Patient not taking: Reported on 04/04/2024)     No current facility-administered medications on file prior to visit.    There are no Patient Instructions on file for this visit. No follow-ups on file.   Annamaria Barrette, NP

## 2024-04-05 ENCOUNTER — Encounter (INDEPENDENT_AMBULATORY_CARE_PROVIDER_SITE_OTHER): Payer: Self-pay | Admitting: Vascular Surgery

## 2024-05-30 ENCOUNTER — Ambulatory Visit (INDEPENDENT_AMBULATORY_CARE_PROVIDER_SITE_OTHER): Admitting: Physician Assistant

## 2024-05-30 ENCOUNTER — Encounter: Payer: Self-pay | Admitting: Physician Assistant

## 2024-05-30 VITALS — BP 110/72 | HR 106 | Temp 98.9°F | Ht 67.0 in | Wt 223.0 lb

## 2024-05-30 DIAGNOSIS — E039 Hypothyroidism, unspecified: Secondary | ICD-10-CM | POA: Diagnosis not present

## 2024-05-30 DIAGNOSIS — D6869 Other thrombophilia: Secondary | ICD-10-CM | POA: Insufficient documentation

## 2024-05-30 DIAGNOSIS — I10 Essential (primary) hypertension: Secondary | ICD-10-CM

## 2024-05-30 DIAGNOSIS — Z7984 Long term (current) use of oral hypoglycemic drugs: Secondary | ICD-10-CM

## 2024-05-30 DIAGNOSIS — Z86711 Personal history of pulmonary embolism: Secondary | ICD-10-CM | POA: Insufficient documentation

## 2024-05-30 DIAGNOSIS — E1169 Type 2 diabetes mellitus with other specified complication: Secondary | ICD-10-CM

## 2024-05-30 DIAGNOSIS — R600 Localized edema: Secondary | ICD-10-CM | POA: Insufficient documentation

## 2024-05-30 LAB — POCT GLYCOSYLATED HEMOGLOBIN (HGB A1C): Hemoglobin A1C: 9 % — AB (ref 4.0–5.6)

## 2024-05-30 MED ORDER — LOSARTAN POTASSIUM-HCTZ 50-12.5 MG PO TABS: 1.0000 | ORAL_TABLET | Freq: Every day | ORAL | 1 refills | Status: AC

## 2024-05-30 MED ORDER — METFORMIN HCL 850 MG PO TABS: 850.0000 mg | ORAL_TABLET | Freq: Two times a day (BID) | ORAL | 1 refills | Status: DC

## 2024-05-30 NOTE — Assessment & Plan Note (Signed)
 Pharmacy recently dispensed the incorrect dose of levothyroxine .  Will repeat TSH and if optimized, will send new prescription for levothyroxine  112 mcg

## 2024-05-30 NOTE — Assessment & Plan Note (Signed)
 Refill losartan -hydrochlorothiazide , seems well-controlled with current regimen.

## 2024-05-30 NOTE — Assessment & Plan Note (Signed)
 Increase metformin  dose to 850 mg BID. Emphasized the importance of routine physical activity especially strength/resistance training within his abilities. Continue glimepiride  2mg  daily.

## 2024-05-30 NOTE — Assessment & Plan Note (Signed)
 Discussed options for pharmacotherapy as well as conservative measures.  For now, we will proceed with conservative measures to include lower extremity elevation at least once per day for 15 to 20 minutes, with emphasis on getting the ankles higher than the heart which he is not accomplishing while he sleeps (recliner).  We also discussed regular physical activity and the role of skeletal muscle contraction in venous return.  Consider compression stockings during the day.  Ultimately I think this will make him more comfortable in terms of retaining fluid, and also lower his risk of recurrent DVT.  He verbalizes understanding

## 2024-05-30 NOTE — Progress Notes (Addendum)
 Date:  05/30/2024   Name:  Daniel Kirby   DOB:  04-19-1946   MRN:  [id number]   Chief Complaint: Medical Management of Chronic Issues  HPI Daniel Kirby is a 78 year old male who presents new to me today as transfer of care from a recently retired Animator Dr. Joshua, here for routine follow-up on chronic conditions.  He is joined by his brother Curtistine today, who says he is concerned that Daniel Kirby does not move/exercise enough. Khari sleeps in a recliner.   Following his last labs 4 months ago, his levothyroxine  dose was adjusted from 125 mcg to 112 mcg daily; he reports good compliance with this medication, due for repeat labs today.  Apparently 2 days ago he picked up the incorrect dose of 125 mcg from the pharmacy.  Importantly in November 2024 he was admitted to [facility] due to bilateral PE with LLE DVT.  He took Eliquis  for 3 months, but this medication was not refilled by Dr. Joshua and he was instead sent for vascular consult which occurred 04/04/2024; they repeated LLE ultrasound which showed no recurrence of DVT but restarted Eliquis  with intention for him to continue this at least until November 2025 when he will be seen for follow-up.  Has also established with podiatry for dystrophic mycotic toenails last debrided 3 months ago.   Medication list has been reviewed and updated.  Current Meds  Medication Sig   apixaban  (ELIQUIS ) 5 MG TABS tablet Take 1 tablet (5 mg total) by mouth 2 (two) times daily.   glimepiride  (AMARYL ) 2 MG tablet One a day   levothyroxine  (SYNTHROID ) 125 MCG tablet Take 125 mcg by mouth daily.   mirabegron  ER (MYRBETRIQ ) 50 MG TB24 tablet Take 1 tablet (50 mg total) by mouth daily.   pravastatin  (PRAVACHOL ) 10 MG tablet Take 1 tablet (10 mg total) by mouth daily.   spironolactone  (ALDACTONE ) 25 MG tablet Take 1 tablet (25 mg total) by mouth daily.   [DISCONTINUED] levothyroxine  (SYNTHROID ) 112 MCG tablet Take 1 tablet (112 mcg total) by mouth daily.   [DISCONTINUED]  losartan -hydrochlorothiazide  (HYZAAR) 50-12.5 MG tablet Take 1 tablet by mouth once daily   [DISCONTINUED] metFORMIN  (GLUCOPHAGE ) 500 MG tablet Take 1 tablet (500 mg total) by mouth 2 (two) times daily with a meal.     Review of Systems  Patient Active Problem List   Diagnosis Date Noted   Bilateral lower extremity edema 05/30/2024   History of pulmonary embolus (PE) 05/30/2024   Acquired thrombophilia (HCC) 05/30/2024   History of ventricular tachycardia 09/28/2023   History of DVT of lower extremity 09/26/2023   Obesity (BMI 30-39.9) 09/26/2023   History of syncope 09/25/2023   Type 2 diabetes mellitus with other specified complication (HCC) 01/18/2018   Chronic low back pain without sciatica 06/08/2017   Essential hypertension 12/08/2016   Adult hypothyroidism 12/08/2016   Arthritis 12/08/2016   Cervical radiculopathy 12/08/2016   Mixed hyperlipidemia 12/08/2016   Erectile dysfunction 06/04/2015   History of prostate cancer 06/04/2015    No Known Allergies  Immunization History  Administered Date(s) Administered   Moderna Sars-Covid-2 Vaccination 12/29/2019, 01/29/2020   Pneumococcal Conjugate-13 08/20/2015   Pneumococcal Polysaccharide-23 09/25/2013   Tdap 09/25/2013    Past Surgical History:  Procedure Laterality Date   CATARACT EXTRACTION W/PHACO Left 06/04/2021   Procedure: CATARACT EXTRACTION PHACO AND INTRAOCULAR LENS PLACEMENT (IOC) LEFT DIABETIC 5.51 01:10.7;  Surgeon: Mittie Gaskin, MD;  Location: [facility];  Service: Ophthalmology;  Laterality: Left;   COLONOSCOPY  2006   normal   COLONOSCOPY WITH PROPOFOL  N/A 09/06/2015   Procedure: COLONOSCOPY WITH PROPOFOL ;  Surgeon: Rogelia Copping, MD;  Location: [facility];  Service: Endoscopy;  Laterality: N/A;   POLYPECTOMY  09/06/2015   Procedure: POLYPECTOMY;  Surgeon: Rogelia Copping, MD;  Location: [facility];  Service: Endoscopy;;   PROSTATE SURGERY     robotic prostatectomy  2006     Social History   Tobacco Use   Smoking status: Former    Current packs/day: 1.00    Average packs/day: 1 pack/day for 16.0 years (16.0 ttl pk-yrs)    Types: Cigars, Cigarettes   Smokeless tobacco: Current    Types: Snuff   Tobacco comments:    Quit 2007. Smoking cessation materials not required  Vaping Use   Vaping status: Never Used  Substance Use Topics   Alcohol use: Not Currently    Alcohol/week: 0.0 standard drinks of alcohol    Comment: occasional, 1-2 drinks per year   Drug use: No    Family History  Problem Relation Age of Onset   Heart attack Father    Diabetes Mother    Kidney disease Brother         01/25/2024    2:27 PM 12/07/2023    2:31 PM 10/26/2023    2:47 PM 05/14/2023    3:19 PM  GAD 7 : Generalized Anxiety Score  Nervous, Anxious, on Edge 0 0 0 0  Control/stop worrying 0 0 0 0  Worry too much - different things 0 0 0 0  Trouble relaxing 0 0 0 0  Restless 0 0 0 0  Easily annoyed or irritable 0 0 0 0  Afraid - awful might happen  0 0 0  Total GAD 7 Score  0 0 0  Anxiety Difficulty  Not difficult at all Not difficult at all Not difficult at all       01/25/2024    2:27 PM 12/07/2023    2:31 PM 10/26/2023    2:46 PM  Depression screen PHQ 2/9  Decreased Interest 0 0 0  Down, Depressed, Hopeless 0 0 0  PHQ - 2 Score 0 0 0  Altered sleeping  0 0  Tired, decreased energy  0 0  Change in appetite  0 0  Feeling bad or failure about yourself   0 0  Trouble concentrating  0 0  Moving slowly or fidgety/restless  0 0  Suicidal thoughts  0 0  PHQ-9 Score  0 0  Difficult doing work/chores  Not difficult at all Not difficult at all    BP Readings from Last 3 Encounters:  05/30/24 110/72  04/04/24 127/78  01/25/24 132/78    Wt Readings from Last 3 Encounters:  05/30/24 223 lb (101.2 kg)  01/25/24 213 lb 9.6 oz (96.9 kg)  12/07/23 207 lb (93.9 kg)    BP 110/72   Pulse (!) 106   Temp 98.9 F (37.2 C)   Ht 5' 7 (1.702 m)   Wt 223 lb  (101.2 kg)   SpO2 95%   BMI 34.93 kg/m   Physical Exam Vitals and nursing note reviewed.  Constitutional:      Appearance: Normal appearance.     Comments: Uses walker  Cardiovascular:     Rate and Rhythm: Regular rhythm. Tachycardia present.     Heart sounds: No murmur heard.    No friction rub. No gallop.  Pulmonary:     Effort: Pulmonary effort is normal.  Breath sounds: Normal breath sounds.  Abdominal:     General: There is no distension.  Musculoskeletal:        General: Normal range of motion.     Right lower leg: 2+ Pitting Edema present.     Left lower leg: 2+ Pitting Edema present.  Skin:    General: Skin is warm and dry.  Neurological:     Mental Status: He is alert and oriented to person, place, and time.  Psychiatric:        Mood and Affect: Mood and affect normal.     Recent Labs     Component Value Date/Time   NA 141 01/25/2024 1521   K 4.7 01/25/2024 1521   CL 102 01/25/2024 1521   CO2 21 01/25/2024 1521   GLUCOSE 101 (H) 01/25/2024 1521   GLUCOSE 134 (H) 09/30/2023 0541   BUN 9 01/25/2024 1521   CREATININE 1.26 01/25/2024 1521   CALCIUM 10.1 01/25/2024 1521   PROT 7.2 01/25/2024 1521   ALBUMIN 4.4 01/25/2024 1521   AST 8 01/25/2024 1521   ALT 19 01/25/2024 1521   ALKPHOS 77 01/25/2024 1521   BILITOT 0.3 01/25/2024 1521   GFRNONAA >60 09/30/2023 0541   GFRAA 84 11/13/2020 1022    Lab Results  Component Value Date   WBC 6.0 09/30/2023   HGB 10.4 (L) 09/30/2023   HCT 29.8 (L) 09/30/2023   MCV 91.4 09/30/2023   PLT 204 09/30/2023   Lab Results  Component Value Date   HGBA1C 9.0 (A) 05/30/2024   Lab Results  Component Value Date   CHOL 100 01/25/2024   HDL 33 (L) 01/25/2024   LDLCALC 43 01/25/2024   TRIG 138 01/25/2024   CHOLHDL 4.7 09/27/2023   Lab Results  Component Value Date   TSH 0.212 (L) 01/25/2024     Assessment and Plan:  Type 2 diabetes mellitus with other specified complication, without long-term current use of  insulin  (HCC) Assessment & Plan: Increase metformin  dose to 850 mg BID. Emphasized the importance of routine physical activity especially strength/resistance training within his abilities. Continue glimepiride  2mg  daily.   Orders: -     POCT glycosylated hemoglobin (Hb A1C) -     Basic metabolic panel with GFR -     metFORMIN  HCl; Take 1 tablet (850 mg total) by mouth 2 (two) times daily with a meal.  Dispense: 180 tablet; Refill: 1  Essential hypertension Assessment & Plan: Refill losartan -hydrochlorothiazide , seems well-controlled with current regimen.   Orders: -     Losartan  Potassium-HCTZ; Take 1 tablet by mouth daily.  Dispense: 90 tablet; Refill: 1  Adult hypothyroidism Assessment & Plan: Pharmacy recently dispensed the incorrect dose of levothyroxine .  Will repeat TSH and if optimized, will send new prescription for levothyroxine  112 mcg  Orders: -     TSH  Bilateral lower extremity edema Assessment & Plan: Discussed options for pharmacotherapy as well as conservative measures.  For now, we will proceed with conservative measures to include lower extremity elevation at least once per day for 15 to 20 minutes, with emphasis on getting the ankles higher than the heart which he is not accomplishing while he sleeps (recliner).  We also discussed regular physical activity and the role of skeletal muscle contraction in venous return.  Consider compression stockings during the day.  Ultimately I think this will make him more comfortable in terms of retaining fluid, and also lower his risk of recurrent DVT.  He verbalizes understanding   History  of pulmonary embolus (PE) Assessment & Plan: Continue Eliquis  as prescribed by vascular.    Acquired thrombophilia (HCC) Assessment & Plan: Continue Eliquis  as prescribed by vascular.       Return in about 3 months (around 08/30/2024) for OV f/u chronic conditions.    Rolan Hoyle, PA-C, DMSc, Nutritionist [facility] [phone]

## 2024-05-30 NOTE — Assessment & Plan Note (Signed)
 Continue Eliquis  as prescribed by vascular.

## 2024-05-31 ENCOUNTER — Ambulatory Visit: Payer: Self-pay | Admitting: Physician Assistant

## 2024-05-31 LAB — BASIC METABOLIC PANEL WITH GFR
BUN/Creatinine Ratio: 18 (ref 10–24)
BUN: 25 mg/dL (ref 8–27)
CO2: 15 mmol/L — ABNORMAL LOW (ref 20–29)
Calcium: 10.1 mg/dL (ref 8.6–10.2)
Chloride: 96 mmol/L (ref 96–106)
Creatinine, Ser: 1.4 mg/dL — ABNORMAL HIGH (ref 0.76–1.27)
Glucose: 256 mg/dL — ABNORMAL HIGH (ref 70–99)
Potassium: 4.4 mmol/L (ref 3.5–5.2)
Sodium: 133 mmol/L — ABNORMAL LOW (ref 134–144)
eGFR: 51 mL/min/1.73 — ABNORMAL LOW (ref >59)

## 2024-05-31 LAB — TSH: TSH: 3.48 u[IU]/mL (ref 0.450–4.500)

## 2024-06-08 ENCOUNTER — Ambulatory Visit

## 2024-06-08 VITALS — Ht 67.0 in | Wt 219.0 lb

## 2024-06-08 DIAGNOSIS — Z0000 Encounter for general adult medical examination without abnormal findings: Secondary | ICD-10-CM

## 2024-06-08 NOTE — Patient Instructions (Signed)
 [name] , Thank you for taking time out of your busy schedule to complete your Annual Wellness Visit with me. I enjoyed our conversation and look forward to speaking with you again next year. I, as well as your care team,  appreciate your ongoing commitment to your health goals. Please review the following plan we discussed and let me know if I can assist you in the future. Your Game plan/ To Do List    Referrals: None   Follow up Visits: Next Medicare AWV with our clinical staff: 06/21/25 @ 10:40am (PHONE VISIT)   Have you seen your provider in the last 6 months (3 months if uncontrolled diabetes)? Yes Next Office Visit with your provider: 08/29/24 @ 10:40am with Daniel Hoyle, PA  Clinician Recommendations: Call and schedule a diabetic eye exam at your earliest convenience.  You should get this every year. Aim for 30 minutes of exercise or brisk walking, 6-8 glasses of water , and 5 servings of fruits and vegetables each day. This is a list of the screening recommended for you and due dates:      Eye Doctor: [facility] [address] [place] [address] Ph [phone]  [facility]             [address]  [place], [place] [address] Ph [phone]  This is a list of the screening recommended for you and due dates:     Health Maintenance  Topic Date Due   Zoster (Shingles) Vaccine (1 of 2) Never done   COVID-19 Vaccine (3 - Moderna risk series) 02/26/2020   Eye exam for diabetics  06/04/2022   DTaP/Tdap/Td vaccine (2 - Td or Tdap) 09/26/2023   Complete foot exam   05/13/2024   Flu Shot  06/16/2024   Hemoglobin A1C  11/30/2024   Yearly kidney health urinalysis for diabetes  12/06/2024   Yearly kidney function blood test for diabetes  05/30/2025   Medicare Annual Wellness Visit  06/08/2025   Pneumococcal Vaccine for age over 2  Completed   Hepatitis C Screening  Completed   Hepatitis B Vaccine  Aged Out   HPV Vaccine  Aged Out   Meningitis B Vaccine   Aged Out   Colon Cancer Screening  Discontinued    Advanced directives: (Provided) Advance directive discussed with you today. I have provided a copy for you to complete at home and have notarized. Once this is complete, please bring a copy in to our office so we can scan it into your chart.  Advance Care Planning is important because it:  [x]  Makes sure you receive the medical care that is consistent with your values, goals, and preferences  [x]  It provides guidance to your family and loved ones and reduces their decisional burden about whether or not they are making the right decisions based on your wishes.  Follow the link provided in your after visit summary or read over the paperwork we have mailed to you to help you started getting your Advance Directives in place. If you need assistance in completing these, please reach out to us  so that we can help you!  See attachments for Preventive Care and Fall Prevention Tips.   Fall Prevention in the Home, Adult Falls can cause injuries and affect people of all ages. There are many simple things that you can do to make your home safe and to help prevent falls. If you need it, ask for help making these changes. What actions can I  take to prevent falls? General information Use good lighting in all rooms. Make sure to: Replace any light bulbs that burn out. Turn on lights if it is dark and use night-lights. Keep items that you use often in easy-to-reach places. Lower the shelves around your home if needed. Move furniture so that there are clear paths around it. Do not keep throw rugs or other things on the floor that can make you trip. If any of your floors are uneven, fix them. Add color or contrast paint or tape to clearly mark and help you see: Grab bars or handrails. First and last steps of staircases. Where the edge of each step is. If you use a ladder or stepladder: Make sure that it is fully opened. Do not climb a closed  ladder. Make sure the sides of the ladder are locked in place. Have someone hold the ladder while you use it. Know where your pets are as you move through your home. What can I do in the bathroom?     Keep the floor dry. Clean up any water  that is on the floor right away. Remove soap buildup in the bathtub or shower. Buildup makes bathtubs and showers slippery. Use non-skid mats or decals on the floor of the bathtub or shower. Attach bath mats securely with double-sided, non-slip rug tape. If you need to sit down while you are in the shower, use a non-slip stool. Install grab bars by the toilet and in the bathtub and shower. Do not use towel bars as grab bars. What can I do in the bedroom? Make sure that you have a light by your bed that is easy to reach. Do not use any sheets or blankets on your bed that hang to the floor. Have a firm bench or chair with side arms that you can use for support when you get dressed. What can I do in the kitchen? Clean up any spills right away. If you need to reach something above you, use a sturdy step stool that has a grab bar. Keep electrical cables out of the way. Do not use floor polish or wax that makes floors slippery. What can I do with my stairs? Do not leave anything on the stairs. Make sure that you have a light switch at the top and the bottom of the stairs. Have them installed if you do not have them. Make sure that there are handrails on both sides of the stairs. Fix handrails that are broken or loose. Make sure that handrails are as long as the staircases. Install non-slip stair treads on all stairs in your home if they do not have carpet. Avoid having throw rugs at the top or bottom of stairs, or secure the rugs with carpet tape to prevent them from moving. Choose a carpet design that does not hide the edge of steps on the stairs. Make sure that carpet is firmly attached to the stairs. Fix any carpet that is loose or worn. What can I do on  the outside of my home? Use bright outdoor lighting. Repair the edges of walkways and driveways and fix any cracks. Clear paths of anything that can make you trip, such as tools or rocks. Add color or contrast paint or tape to clearly mark and help you see high doorway thresholds. Trim any bushes or trees on the main path into your home. Check that handrails are securely fastened and in good repair. Both sides of all steps should have handrails. Install guardrails  along the edges of any raised decks or porches. Have leaves, snow, and ice cleared regularly. Use sand, salt, or ice melt on walkways during winter months if you live where there is ice and snow. In the garage, clean up any spills right away, including grease or oil spills. What other actions can I take? Review your medicines with your health care provider. Some medicines can make you confused or feel dizzy. This can increase your chance of falling. Wear closed-toe shoes that fit well and support your feet. Wear shoes that have rubber soles and low heels. Use a cane, walker, scooter, or crutches that help you move around if needed. Talk with your provider about other ways that you can decrease your risk of falls. This may include seeing a physical therapist to learn to do exercises to improve movement and strength. Where to find more information Centers for Disease Control and Prevention, [employer]: TonerPromos.no [employer] on Aging: BaseRingTones.pl National Institute on Aging: BaseRingTones.pl Contact a health care provider if: You are afraid of falling at home. You feel weak, drowsy, or dizzy at home. You fall at home. Get help right away if you: Lose consciousness or have trouble moving after a fall. Have a fall that causes a head injury. These symptoms may be an emergency. Get help right away. Call 911. Do not wait to see if the symptoms will go away. Do not drive yourself to the hospital. This information is not intended to replace  advice given to you by your health care provider. Make sure you discuss any questions you have with your health care provider. Document Revised: 07/06/2022 Document Reviewed: 07/06/2022 Elsevier Patient Education  2024 [employer].

## 2024-06-08 NOTE — Progress Notes (Signed)
 Subjective:   Daniel Kirby is a 78 y.o. who presents for a Medicare Wellness preventive visit.  As a reminder, Annual Wellness Visits don't include a physical exam, and some assessments may be limited, especially if this visit is performed virtually. We may recommend an in-person follow-up visit with your provider if needed.  Visit Complete: Virtual I connected with  Sherida KANDICE Boers on 06/08/24 by a audio enabled telemedicine application and verified that I am speaking with the correct person using two identifiers.  Patient Location: Home  Provider Location: Home Office  I discussed the limitations of evaluation and management by telemedicine. The patient expressed understanding and agreed to proceed.  Vital Signs: Because this visit was a virtual/telehealth visit, some criteria may be missing or patient reported. Any vitals not documented were not able to be obtained and vitals that have been documented are patient reported.  VideoDeclined- This patient declined Librarian, academic. Therefore the visit was completed with audio only.  Persons Participating in Visit: Patient.  AWV Questionnaire: No: Patient Medicare AWV questionnaire was not completed prior to this visit.  Cardiac Risk Factors include: advanced age (>66men, >62 women);male gender;diabetes mellitus;hypertension;dyslipidemia;obesity (BMI >30kg/m2);smoking/ tobacco exposure     Objective:    Today's Vitals   06/08/24 1040  Weight: 219 lb (99.3 kg)  Height: 5' 7 (1.702 m)   Body mass index is 34.3 kg/m.     06/08/2024   10:51 AM 09/25/2023    5:38 PM 11/23/2022    1:57 PM 11/05/2021    1:26 PM 06/04/2021   11:25 AM 11/04/2020    1:33 PM 11/10/2017    9:05 AM  Advanced Directives  Does Patient Have a Medical Advance Directive? No No No No No No No   Does patient want to make changes to medical advance directive?     No - Patient declined    Copy of Healthcare Power of Attorney in  Chart?     No - copy requested    Would patient like information on creating a medical advance directive? Yes (MAU/Ambulatory/Procedural Areas - Information given) No - Patient declined No - Patient declined Yes (MAU/Ambulatory/Procedural Areas - Information given)  Yes (MAU/Ambulatory/Procedural Areas - Information given) Yes (MAU/Ambulatory/Procedural Areas - Information given)      Data saved with a previous flowsheet row definition    Current Medications (verified) Outpatient Encounter Medications as of 06/08/2024  Medication Sig   apixaban  (ELIQUIS ) 5 MG TABS tablet Take 1 tablet (5 mg total) by mouth 2 (two) times daily.   glimepiride  (AMARYL ) 2 MG tablet One a day   levothyroxine  (SYNTHROID ) 125 MCG tablet Take 125 mcg by mouth daily.   losartan -hydrochlorothiazide  (HYZAAR) 50-12.5 MG tablet Take 1 tablet by mouth daily.   metFORMIN  (GLUCOPHAGE ) 850 MG tablet Take 1 tablet (850 mg total) by mouth 2 (two) times daily with a meal.   mirabegron  ER (MYRBETRIQ ) 50 MG TB24 tablet Take 1 tablet (50 mg total) by mouth daily.   pravastatin  (PRAVACHOL ) 10 MG tablet Take 1 tablet (10 mg total) by mouth daily.   spironolactone  (ALDACTONE ) 25 MG tablet Take 1 tablet (25 mg total) by mouth daily.   No facility-administered encounter medications on file as of 06/08/2024.    Allergies (verified) Patient has no known allergies.   History: Past Medical History:  Diagnosis Date   Abdominal pain    RUQ   Arthritis    right ankle   ED (erectile dysfunction)  HLD (hyperlipidemia)    Hypertension    Hypothyroid    Prostate cancer (HCC)    Vertigo    1 episode only, approx 1 month ago   Past Surgical History:  Procedure Laterality Date   CATARACT EXTRACTION W/PHACO Left 06/04/2021   Procedure: CATARACT EXTRACTION PHACO AND INTRAOCULAR LENS PLACEMENT (IOC) LEFT DIABETIC 5.51 01:10.7;  Surgeon: Mittie Gaskin, MD;  Location: [facility];  Service: Ophthalmology;  Laterality: Left;    COLONOSCOPY  2006   normal   COLONOSCOPY WITH PROPOFOL  N/A 09/06/2015   Procedure: COLONOSCOPY WITH PROPOFOL ;  Surgeon: Rogelia Copping, MD;  Location: [facility];  Service: Endoscopy;  Laterality: N/A;   POLYPECTOMY  09/06/2015   Procedure: POLYPECTOMY;  Surgeon: Rogelia Copping, MD;  Location: [facility];  Service: Endoscopy;;   PROSTATE SURGERY     robotic prostatectomy  2006   Family History  Problem Relation Age of Onset   Heart attack Father    Diabetes Mother    Kidney disease Brother    Social History   Socioeconomic History   Marital status: Divorced    Spouse name: Not on file   Number of children: 3   Years of education: Not on file   Highest education level: 12th grade  Occupational History   Occupation: Retired  Tobacco Use   Smoking status: Some Days    Types: Cigars   Smokeless tobacco: Current    Types: Snuff   Tobacco comments:    Quit 2007. Smoking cessation materials not required    06/08/24 patients smoke 1 cigar every 6 mths and uses smokeless tobacco twice a day/pbt  Vaping Use   Vaping status: Never Used  Substance and Sexual Activity   Alcohol use: Not Currently    Comment: occasional, 1-2 drinks per year, last use 5 yrs ago   Drug use: No   Sexual activity: Yes  Other Topics Concern   Not on file  Social History Narrative   Lives alone.    Social Drivers of Corporate investment banker Strain: Low Risk  (06/08/2024)   Overall Financial Resource Strain (CARDIA)    Difficulty of Paying Living Expenses: Not hard at all  Food Insecurity: No Food Insecurity (06/08/2024)   Hunger Vital Sign    Worried About Running Out of Food in the Last Year: Never true    Ran Out of Food in the Last Year: Never true  Transportation Needs: No Transportation Needs (06/08/2024)   PRAPARE - Administrator, Civil Service (Medical): No    Lack of Transportation (Non-Medical): No  Physical Activity: Inactive (06/08/2024)   Exercise Vital Sign     Days of Exercise per Week: 0 days    Minutes of Exercise per Session: 0 min  Stress: No Stress Concern Present (06/08/2024)   [employer] of Occupational Health - Occupational Stress Questionnaire    Feeling of Stress: Not at all  Social Connections: Socially Isolated (06/08/2024)   Social Connection and Isolation Panel    Frequency of Communication with Friends and Family: Three times a week    Frequency of Social Gatherings with Friends and Family: Twice a week    Attends Religious Services: Never    Database administrator or Organizations: No    Attends Banker Meetings: Never    Marital Status: Divorced    Tobacco Counseling Ready to quit: Not Answered Counseling given: Not Answered Tobacco comments: Quit 2007. Smoking cessation materials not required 06/08/24  patients smoke 1 cigar every 6 mths and uses smokeless tobacco twice a day/pbt    Clinical Intake:  Pre-visit preparation completed: Yes  Pain : No/denies pain     BMI - recorded: 34.3 Nutritional Status: BMI > 30  Obese Nutritional Risks: None Diabetes: Yes CBG done?: No Did pt. bring in CBG monitor from home?: No  Lab Results  Component Value Date   HGBA1C 9.0 (A) 05/30/2024   HGBA1C 6.0 (H) 12/07/2023   HGBA1C 7.2 (H) 09/25/2023     How often do you need to have someone help you when you read instructions, pamphlets, or other written materials from your doctor or pharmacy?: 1 - Never  Interpreter Needed?: No  Information entered by :: Vina Ned, CMA   Activities of Daily Living     06/08/2024   10:42 AM  In your present state of health, do you have any difficulty performing the following activities:  Hearing? 0  Vision? 0  Difficulty concentrating or making decisions? 0  Walking or climbing stairs? 1  Comment uses a rollator walker  Dressing or bathing? 0  Doing errands, shopping? 1  Comment doesn't drive, brother takes to appointments  Preparing Food and eating ? N   Using the Toilet? N  In the past six months, have you accidently leaked urine? N  Do you have problems with loss of bowel control? N  Managing your Medications? N  Managing your Finances? N  Housekeeping or managing your Housekeeping? N    Patient Care Team: Manya Toribio SQUIBB, PA as PCP - General (Physician Assistant) Helon Clotilda DELENA DEVONNA as Physician Assistant (Urology) Elisabeth Gwendlyn SAUNDERS, NP as Nurse Practitioner (Vascular Surgery) Lennie Barter, DPM as Consulting Physician (Podiatry) Pa, [facility] (Optometry)  I have updated your Care Teams any recent Medical Services you may have received from other providers in the past year.     Assessment:   This is a routine wellness examination for Jayton.  Hearing/Vision screen Hearing Screening - Comments:: Denies hearing loss  Vision Screening - Comments:: Needs DM eye exam, [facility], [place]. Pt will call to schedule. Declined referral   Goals Addressed               This Visit's Progress     Increase physical activity (pt-stated)         Depression Screen     06/08/2024   10:49 AM 01/25/2024    2:27 PM 12/07/2023    2:31 PM 10/26/2023    2:46 PM 05/14/2023    3:19 PM 01/14/2023    2:32 PM 12/10/2022    2:14 PM  PHQ 2/9 Scores  PHQ - 2 Score 0 0 0 0 0 0 0  PHQ- 9 Score 0  0 0 0 0 0    Fall Risk     06/08/2024   10:54 AM 01/25/2024    2:27 PM 12/07/2023    2:31 PM 10/26/2023    2:47 PM 05/14/2023    3:19 PM  Fall Risk   Falls in the past year? 0 0 0 1 0  Number falls in past yr: 0 0 0 0 0  Injury with Fall? 0 0 0 1 0  Risk for fall due to : History of fall(s);Impaired balance/gait;Orthopedic patient;Impaired mobility  No Fall Risks History of fall(s) No Fall Risks  Follow up Falls evaluation completed;Education provided  Falls evaluation completed Falls evaluation completed Falls evaluation completed;Falls prevention discussed    MEDICARE  RISK AT HOME:  Medicare Risk at Home Any stairs  in or around the home?: Yes If so, are there any without handrails?: Yes Home free of loose throw rugs in walkways, pet beds, electrical cords, etc?: Yes Adequate lighting in your home to reduce risk of falls?: Yes Life alert?: No Use of a cane, walker or w/c?: Yes (rollator) Grab bars in the bathroom?: No Shower chair or bench in shower?: Yes Elevated toilet seat or a handicapped toilet?: No  TIMED UP AND GO:  Was the test performed?  No  Cognitive Function: 6CIT completed        06/08/2024   10:55 AM 11/23/2022    1:31 PM 11/10/2017    9:10 AM  6CIT Screen  What Year? 0 points 0 points 0 points  What month? 0 points 0 points 0 points  What time? 0 points 0 points 3 points  Count back from 20 0 points 0 points 0 points  Months in reverse 0 points 0 points 0 points  Repeat phrase 2 points 2 points 4 points  Total Score 2 points 2 points 7 points    Immunizations Immunization History  Administered Date(s) Administered   Moderna Sars-Covid-2 Vaccination 12/29/2019, 01/29/2020   Pneumococcal Conjugate-13 08/20/2015   Pneumococcal Polysaccharide-23 09/25/2013   Tdap 09/25/2013    Screening Tests Health Maintenance  Topic Date Due   Zoster Vaccines- Shingrix (1 of 2) Never done   COVID-19 Vaccine (3 - Moderna risk series) 02/26/2020   OPHTHALMOLOGY EXAM  06/04/2022   DTaP/Tdap/Td (2 - Td or Tdap) 09/26/2023   FOOT EXAM  05/13/2024   INFLUENZA VACCINE  06/16/2024   HEMOGLOBIN A1C  11/30/2024   Diabetic kidney evaluation - Urine ACR  12/06/2024   Diabetic kidney evaluation - eGFR measurement  05/30/2025   Medicare Annual Wellness (AWV)  06/08/2025   Pneumococcal Vaccine: 50+ Years  Completed   Hepatitis C Screening  Completed   Hepatitis B Vaccines  Aged Out   HPV VACCINES  Aged Out   Meningococcal B Vaccine  Aged Out   Colonoscopy  Discontinued    Health Maintenance  Health Maintenance Due  Topic Date Due   Zoster Vaccines- Shingrix (1 of 2) Never done    COVID-19 Vaccine (3 - Moderna risk series) 02/26/2020   OPHTHALMOLOGY EXAM  06/04/2022   DTaP/Tdap/Td (2 - Td or Tdap) 09/26/2023   FOOT EXAM  05/13/2024   Health Maintenance Items Addressed: Diabetic Foot Exam recommended, See Nurse Notes at the end of this note  Additional Screening:  Vision Screening: Recommended annual ophthalmology exams for early detection of glaucoma and other disorders of the eye. Would you like a referral to an eye doctor? No    Dental Screening: Recommended annual dental exams for proper oral hygiene  Community Resource Referral / Chronic Care Management: CRR required this visit?  No   CCM required this visit?  No   Plan:    I have personally reviewed and noted the following in the patient's chart:   Medical and social history Use of alcohol, tobacco or illicit drugs  Current medications and supplements including opioid prescriptions. Patient is not currently taking opioid prescriptions. Functional ability and status Nutritional status Physical activity Advanced directives List of other physicians Hospitalizations, surgeries, and ER visits in previous 12 months Vitals Screenings to include cognitive, depression, and falls Referrals and appointments  In addition, I have reviewed and discussed with patient certain preventive protocols, quality metrics, and best practice recommendations. A written  personalized care plan for preventive services as well as general preventive health recommendations were provided to patient.   Vina Ned, CMA   06/08/2024   After Visit Summary: (Mail) Due to this being a telephonic visit, the after visit summary with patients personalized plan was offered to patient via mail   Notes:  6 CIT Score - 2 Needs DM eye exam. Declined referral. Patient will call [facility] to schedule. Phone # given Needs DM foot exam at next OV (08/29/24) Declined DM & Nutrition education referral Declined Tdap, Covid and Shingrix  vaccines

## 2024-08-08 DIAGNOSIS — Z961 Presence of intraocular lens: Secondary | ICD-10-CM | POA: Diagnosis not present

## 2024-08-08 DIAGNOSIS — H2511 Age-related nuclear cataract, right eye: Secondary | ICD-10-CM | POA: Diagnosis not present

## 2024-08-08 DIAGNOSIS — E119 Type 2 diabetes mellitus without complications: Secondary | ICD-10-CM | POA: Diagnosis not present

## 2024-08-08 DIAGNOSIS — H40003 Preglaucoma, unspecified, bilateral: Secondary | ICD-10-CM | POA: Diagnosis not present

## 2024-08-08 LAB — HM DIABETES EYE EXAM

## 2024-08-29 ENCOUNTER — Encounter: Payer: Self-pay | Admitting: Physician Assistant

## 2024-08-29 ENCOUNTER — Ambulatory Visit: Admitting: Physician Assistant

## 2024-09-05 ENCOUNTER — Encounter: Payer: Self-pay | Admitting: Physician Assistant

## 2024-09-05 ENCOUNTER — Ambulatory Visit: Admitting: Physician Assistant

## 2024-09-05 VITALS — BP 128/70 | HR 98 | Temp 99.1°F | Ht 67.0 in | Wt 225.0 lb

## 2024-09-05 DIAGNOSIS — Z86711 Personal history of pulmonary embolism: Secondary | ICD-10-CM | POA: Diagnosis not present

## 2024-09-05 DIAGNOSIS — E039 Hypothyroidism, unspecified: Secondary | ICD-10-CM

## 2024-09-05 DIAGNOSIS — E1169 Type 2 diabetes mellitus with other specified complication: Secondary | ICD-10-CM | POA: Diagnosis not present

## 2024-09-05 DIAGNOSIS — R600 Localized edema: Secondary | ICD-10-CM | POA: Diagnosis not present

## 2024-09-05 DIAGNOSIS — Z7984 Long term (current) use of oral hypoglycemic drugs: Secondary | ICD-10-CM | POA: Diagnosis not present

## 2024-09-05 LAB — POCT GLYCOSYLATED HEMOGLOBIN (HGB A1C): Hemoglobin A1C: 5.7 % — AB (ref 4.0–5.6)

## 2024-09-05 NOTE — Assessment & Plan Note (Signed)
 Discussed options for pharmacotherapy as well as conservative measures.  For now, we will proceed with conservative measures to include lower extremity elevation at least once per day for 15 to 20 minutes, with emphasis on getting the ankles higher than the heart which he is not accomplishing while he sleeps (recliner).  We also discussed regular physical activity and the role of skeletal muscle contraction in venous return.  Consider compression stockings during the day.  Ultimately I think this will make him more comfortable in terms of retaining fluid, and also lower his risk of recurrent DVT.  He verbalizes understanding

## 2024-09-05 NOTE — Assessment & Plan Note (Signed)
 Continue Eliquis  as prescribed by vascular.  Follow-up with them as scheduled in a few weeks

## 2024-09-05 NOTE — Progress Notes (Signed)
 Date:  09/05/2024   Name:  Daniel Kirby   DOB:  09/30/1946   MRN:  [id number]   Chief Complaint: Medical Management of Chronic Issues  HPI Daniel Kirby is a 78 year old male who presents for 3 month follow-up on chronic conditions.  He is joined by his brother Curtistine today, who says he is concerned that Daniel Kirby does not move/exercise enough. Ples sleeps in a recliner.   He continues taking levothyroxine  125 mcg daily with good compliance, due for thyroid  recheck today.  Unfortunately despite multiple health providers including myself advocating for a more active lifestyle, Daniel Kirby remains largely sedentary and spends most of his time in his recliner watching TV.  He has considered purchasing a pedal device, asks if this would be a good idea.  He struggles with chronic bilateral lower extremity swelling, and despite my advice last visit to work on elevating the extremities throughout the day and overnight, he has not done so.  He uses a walker, and his ambulation seems to be deteriorating over time.  It seems that he simply cannot find the motivation to pursue physical activity.  Importantly in November 2024 he was admitted to [facility] due to bilateral PE with LLE DVT, started on Eliquis .  Vascular consult 04/04/2024 refilled Eliquis  with intention for him to continue this at least until November 2025 when he will be seen for follow-up.   Medication list has been reviewed and updated.  Current Meds  Medication Sig   apixaban  (ELIQUIS ) 5 MG TABS tablet Take 1 tablet (5 mg total) by mouth 2 (two) times daily.   glimepiride  (AMARYL ) 2 MG tablet One a day   levothyroxine  (SYNTHROID ) 125 MCG tablet Take 125 mcg by mouth daily.   losartan -hydrochlorothiazide  (HYZAAR) 50-12.5 MG tablet Take 1 tablet by mouth daily.   metFORMIN  (GLUCOPHAGE ) 850 MG tablet Take 1 tablet (850 mg total) by mouth 2 (two) times daily with a meal.   mirabegron  ER (MYRBETRIQ ) 50 MG TB24 tablet Take 1 tablet (50 mg total) by mouth  daily.   pravastatin  (PRAVACHOL ) 10 MG tablet Take 1 tablet (10 mg total) by mouth daily.   spironolactone  (ALDACTONE ) 25 MG tablet Take 1 tablet (25 mg total) by mouth daily.     Review of Systems  Patient Active Problem List   Diagnosis Date Noted   Bilateral lower extremity edema 05/30/2024   History of pulmonary embolus (PE) 05/30/2024   Acquired thrombophilia 05/30/2024   History of ventricular tachycardia 09/28/2023   History of DVT of lower extremity 09/26/2023   Class 2 severe obesity with serious comorbidity in adult 09/26/2023   History of syncope 09/25/2023   Type 2 diabetes mellitus with other specified complication (HCC) 01/18/2018   Chronic low back pain without sciatica 06/08/2017   Essential hypertension 12/08/2016   Adult hypothyroidism 12/08/2016   Arthritis 12/08/2016   Cervical radiculopathy 12/08/2016   Mixed hyperlipidemia 12/08/2016   Erectile dysfunction 06/04/2015   History of prostate cancer 06/04/2015    No Known Allergies  Immunization History  Administered Date(s) Administered   Moderna Sars-Covid-2 Vaccination 12/29/2019, 01/29/2020   Pneumococcal Conjugate-13 08/20/2015   Pneumococcal Polysaccharide-23 09/25/2013   Tdap 09/25/2013    Past Surgical History:  Procedure Laterality Date   CATARACT EXTRACTION W/PHACO Left 06/04/2021   Procedure: CATARACT EXTRACTION PHACO AND INTRAOCULAR LENS PLACEMENT (IOC) LEFT DIABETIC 5.51 01:10.7;  Surgeon: Mittie Gaskin, MD;  Location: [facility];  Service: Ophthalmology;  Laterality: Left;   COLONOSCOPY  2006   normal  COLONOSCOPY WITH PROPOFOL  N/A 09/06/2015   Procedure: COLONOSCOPY WITH PROPOFOL ;  Surgeon: Rogelia Copping, MD;  Location: [facility];  Service: Endoscopy;  Laterality: N/A;   POLYPECTOMY  09/06/2015   Procedure: POLYPECTOMY;  Surgeon: Rogelia Copping, MD;  Location: [facility];  Service: Endoscopy;;   PROSTATE SURGERY     robotic prostatectomy  2006    Social  History   Tobacco Use   Smoking status: Some Days    Types: Cigars   Smokeless tobacco: Current    Types: Snuff   Tobacco comments:    Quit 2007. Smoking cessation materials not required    06/08/24 patients smoke 1 cigar every 6 mths and uses smokeless tobacco twice a day/pbt  Vaping Use   Vaping status: Never Used  Substance Use Topics   Alcohol use: Not Currently    Comment: occasional, 1-2 drinks per year, last use 5 yrs ago   Drug use: No    Family History  Problem Relation Age of Onset   Heart attack Father    Diabetes Mother    Kidney disease Brother         09/05/2024    9:44 AM 01/25/2024    2:27 PM 12/07/2023    2:31 PM 10/26/2023    2:47 PM  GAD 7 : Generalized Anxiety Score  Nervous, Anxious, on Edge 0 0 0 0  Control/stop worrying 0 0 0 0  Worry too much - different things 0 0 0 0  Trouble relaxing 0 0 0 0  Restless 0 0 0 0  Easily annoyed or irritable 0 0 0 0  Afraid - awful might happen 0  0 0  Total GAD 7 Score 0  0 0  Anxiety Difficulty Not difficult at all  Not difficult at all Not difficult at all       09/05/2024    9:44 AM 06/08/2024   10:49 AM 01/25/2024    2:27 PM  Depression screen PHQ 2/9  Decreased Interest 0 0 0  Down, Depressed, Hopeless 0 0 0  PHQ - 2 Score 0 0 0  Altered sleeping  0   Tired, decreased energy  0   Change in appetite  0   Feeling bad or failure about yourself   0   Trouble concentrating  0   Moving slowly or fidgety/restless  0   Suicidal thoughts  0   PHQ-9 Score  0   Difficult doing work/chores  Not difficult at all     BP Readings from Last 3 Encounters:  09/05/24 128/70  05/30/24 110/72  04/04/24 127/78    Wt Readings from Last 3 Encounters:  09/05/24 225 lb (102.1 kg)  06/08/24 219 lb (99.3 kg)  05/30/24 223 lb (101.2 kg)    BP 128/70   Pulse 98   Temp 99.1 F (37.3 C)   Ht 5' 7 (1.702 m)   Wt 225 lb (102.1 kg)   SpO2 96%   BMI 35.24 kg/m   Physical Exam  Recent Labs     Component  Value Date/Time   NA 133 (L) 05/30/2024 1153   K 4.4 05/30/2024 1153   CL 96 05/30/2024 1153   CO2 15 (L) 05/30/2024 1153   GLUCOSE 256 (H) 05/30/2024 1153   GLUCOSE 134 (H) 09/30/2023 0541   BUN 25 05/30/2024 1153   CREATININE 1.40 (H) 05/30/2024 1153   CALCIUM 10.1 05/30/2024 1153   PROT 7.2 01/25/2024 1521   ALBUMIN 4.4 01/25/2024 1521  AST 8 01/25/2024 1521   ALT 19 01/25/2024 1521   ALKPHOS 77 01/25/2024 1521   BILITOT 0.3 01/25/2024 1521   GFRNONAA >60 09/30/2023 0541   GFRAA 84 11/13/2020 1022    Lab Results  Component Value Date   WBC 6.0 09/30/2023   HGB 10.4 (L) 09/30/2023   HCT 29.8 (L) 09/30/2023   MCV 91.4 09/30/2023   PLT 204 09/30/2023   Lab Results  Component Value Date   HGBA1C 5.7 (A) 09/05/2024   HGBA1C 9.0 (A) 05/30/2024   HGBA1C 6.0 (H) 12/07/2023   Lab Results  Component Value Date   CHOL 100 01/25/2024   HDL 33 (L) 01/25/2024   LDLCALC 43 01/25/2024   TRIG 138 01/25/2024   CHOLHDL 4.7 09/27/2023   Lab Results  Component Value Date   TSH 3.480 05/30/2024      Assessment and Plan:  Type 2 diabetes mellitus with other specified complication, unspecified whether long term insulin  use (HCC) Assessment & Plan: A1c 5.7% today, down from 9.0% representing pronounced response to metformin  dose increase to 850 mg BID last visit.  This is such a dramatic improvement that I question validity so we will be sending for an A1c via phlebotomy as well today.    Emphasized the importance of routine physical activity especially strength/resistance training within his abilities. Continue glimepiride  2mg  daily.   Orders: -     POCT glycosylated hemoglobin (Hb A1C) -     Comprehensive metabolic panel with GFR -     Hemoglobin A1c  Bilateral lower extremity edema Assessment & Plan: Discussed options for pharmacotherapy as well as conservative measures.  For now, we will proceed with conservative measures to include lower extremity elevation at least  once per day for 15 to 20 minutes, with emphasis on getting the ankles higher than the heart which he is not accomplishing while he sleeps (recliner).  We also discussed regular physical activity and the role of skeletal muscle contraction in venous return.  Consider compression stockings during the day.  Ultimately I think this will make him more comfortable in terms of retaining fluid, and also lower his risk of recurrent DVT.  He verbalizes understanding   History of pulmonary embolus (PE) Assessment & Plan: Continue Eliquis  as prescribed by vascular.  Follow-up with them as scheduled in a few weeks   Adult hypothyroidism Assessment & Plan: Recheck thyroid  labs  Orders: -     TSH + free T4     Return in about 3 months (around 12/06/2024) for OV f/u chronic conditions.    Rolan Hoyle, PA-C, DMSc, Nutritionist [facility] [phone]

## 2024-09-05 NOTE — Assessment & Plan Note (Signed)
 Recheck thyroid labs

## 2024-09-05 NOTE — Assessment & Plan Note (Signed)
 A1c 5.7% today, down from 9.0% representing pronounced response to metformin  dose increase to 850 mg BID last visit.  This is such a dramatic improvement that I question validity so we will be sending for an A1c via phlebotomy as well today.    Emphasized the importance of routine physical activity especially strength/resistance training within his abilities. Continue glimepiride  2mg  daily.

## 2024-09-06 ENCOUNTER — Other Ambulatory Visit: Payer: Self-pay | Admitting: Physician Assistant

## 2024-09-06 ENCOUNTER — Ambulatory Visit: Payer: Self-pay | Admitting: Physician Assistant

## 2024-09-06 DIAGNOSIS — E039 Hypothyroidism, unspecified: Secondary | ICD-10-CM

## 2024-09-06 DIAGNOSIS — E119 Type 2 diabetes mellitus without complications: Secondary | ICD-10-CM

## 2024-09-06 DIAGNOSIS — E782 Mixed hyperlipidemia: Secondary | ICD-10-CM

## 2024-09-06 LAB — COMPREHENSIVE METABOLIC PANEL WITH GFR
ALT: 22 IU/L (ref 0–44)
AST: 11 IU/L (ref 0–40)
Albumin: 4.5 g/dL (ref 3.8–4.8)
Alkaline Phosphatase: 66 IU/L (ref 47–123)
BUN/Creatinine Ratio: 11 (ref 10–24)
BUN: 12 mg/dL (ref 8–27)
Bilirubin Total: 0.3 mg/dL (ref 0.0–1.2)
CO2: 21 mmol/L (ref 20–29)
Calcium: 10 mg/dL (ref 8.6–10.2)
Chloride: 102 mmol/L (ref 96–106)
Creatinine, Ser: 1.11 mg/dL (ref 0.76–1.27)
Globulin, Total: 2.5 g/dL (ref 1.5–4.5)
Glucose: 96 mg/dL (ref 70–99)
Potassium: 4 mmol/L (ref 3.5–5.2)
Sodium: 140 mmol/L (ref 134–144)
Total Protein: 7 g/dL (ref 6.0–8.5)
eGFR: 68 mL/min/1.73 (ref >59)

## 2024-09-06 LAB — HEMOGLOBIN A1C
Est. average glucose Bld gHb Est-mCnc: 128 mg/dL
Hgb A1c MFr Bld: 6.1 % — ABNORMAL HIGH (ref 4.8–5.6)

## 2024-09-06 LAB — TSH+FREE T4
Free T4: 1.5 ng/dL (ref 0.82–1.77)
TSH: 0.621 u[IU]/mL (ref 0.450–4.500)

## 2024-09-06 MED ORDER — LEVOTHYROXINE SODIUM 125 MCG PO TABS: 125.0000 ug | ORAL_TABLET | Freq: Every day | ORAL | 1 refills | Status: AC

## 2024-09-06 MED ORDER — PRAVASTATIN SODIUM 10 MG PO TABS: 10.0000 mg | ORAL_TABLET | Freq: Every day | ORAL | 1 refills | Status: AC

## 2024-09-06 MED ORDER — GLIMEPIRIDE 2 MG PO TABS: ORAL_TABLET | ORAL | 1 refills | Status: AC

## 2024-09-29 ENCOUNTER — Other Ambulatory Visit (INDEPENDENT_AMBULATORY_CARE_PROVIDER_SITE_OTHER): Payer: Self-pay | Admitting: Vascular Surgery

## 2024-09-29 DIAGNOSIS — I82432 Acute embolism and thrombosis of left popliteal vein: Secondary | ICD-10-CM

## 2024-10-03 ENCOUNTER — Encounter (INDEPENDENT_AMBULATORY_CARE_PROVIDER_SITE_OTHER)

## 2024-10-03 ENCOUNTER — Ambulatory Visit (INDEPENDENT_AMBULATORY_CARE_PROVIDER_SITE_OTHER): Admitting: Vascular Surgery

## 2024-10-13 ENCOUNTER — Other Ambulatory Visit: Payer: Self-pay | Admitting: Urology

## 2024-10-13 DIAGNOSIS — N3941 Urge incontinence: Secondary | ICD-10-CM

## 2024-10-24 ENCOUNTER — Other Ambulatory Visit: Payer: Self-pay | Admitting: Urology

## 2024-10-24 DIAGNOSIS — N3941 Urge incontinence: Secondary | ICD-10-CM

## 2024-10-29 NOTE — Progress Notes (Deleted)
 10/30/2024 5:44 PM   Daniel Kirby 23-Apr-1946 [id number]  Referring provider: Manya Toribio SQUIBB, PA [address] [place],  [place] [address]  Urological history: 1. Prostate cancer -PSA pending -s/p RPP > 10 years ago  2. UI -contributing factors of age, prostate cancer, pelvic surgery, sugary drinks, diabetes, depression and anxiety -Myrbetriq  25 mg daily   3. ED -contributing factors af age, prostate cancer, pelvic surgery, diabetes, HLD, HTN, hypothyroidism and smoking history -managed with [facility]CI  No chief complaint on file.  HPI: Daniel Kirby is a 78 y.o. male who presents today for a yearly visit.      Previous records reviewed.   They are having (1 to 7) or (8 or more) daytime voids,  they are having nocturia (1-2) or (3 or more) and urgency is (none, mild, strong, severe).   They are having (stress, urge or mixed incontinence.)    they are having urinary leakage (1-2 times weekly, 3 or more times weekly, 1-2 times daily and 3 or more times daily) They are using absorbent products for leakage (no, sometimes, always )   the type of products they use are (panty liners, absorbant pads, depends) *** daily.  They are not limiting fluids.  They are not engaging in toilet mapping  ***  UA ***  PVR ***  Serum creatinine (08/2024) 1.11  Hemoglobin A1c (08/2024) 6.1  OAB agent: Myrbetriq  50 mg daily   Diuretics hydrochlorothiazide     Fluid consumption ***    PMH: Past Medical History:  Diagnosis Date   Abdominal pain    RUQ   Arthritis    right ankle   ED (erectile dysfunction)    HLD (hyperlipidemia)    Hypertension    Hypothyroid    Prostate cancer (HCC)    Vertigo    1 episode only, approx 1 month ago    Surgical History: Past Surgical History:  Procedure Laterality Date   CATARACT EXTRACTION W/PHACO Left 06/04/2021   Procedure: CATARACT EXTRACTION PHACO AND INTRAOCULAR LENS PLACEMENT (IOC) LEFT DIABETIC 5.51 01:10.7;  Surgeon: Mittie Gaskin, MD;  Location: [facility];  Service: Ophthalmology;  Laterality: Left;   COLONOSCOPY  2006   normal   COLONOSCOPY WITH PROPOFOL  N/A 09/06/2015   Procedure: COLONOSCOPY WITH PROPOFOL ;  Surgeon: Rogelia Copping, MD;  Location: [facility];  Service: Endoscopy;  Laterality: N/A;   POLYPECTOMY  09/06/2015   Procedure: POLYPECTOMY;  Surgeon: Rogelia Copping, MD;  Location: [facility];  Service: Endoscopy;;   PROSTATE SURGERY     robotic prostatectomy  2006    Home Medications:  Allergies as of 10/30/2024   No Known Allergies      Medication List        Accurate as of October 29, 2024  5:44 PM. If you have any questions, ask your nurse or doctor.          apixaban  5 MG Tabs tablet Commonly known as: ELIQUIS  Take 1 tablet (5 mg total) by mouth 2 (two) times daily.   glimepiride  2 MG tablet Commonly known as: AMARYL  One a day   levothyroxine  125 MCG tablet Commonly known as: SYNTHROID  Take 1 tablet (125 mcg total) by mouth daily. Take on empty stomach, first thing in the morning, full glass of water , 30 min before anything else   losartan -hydrochlorothiazide  50-12.5 MG tablet Commonly known as: HYZAAR Take 1 tablet by mouth daily.   metFORMIN  850 MG tablet Commonly known as: GLUCOPHAGE  Take 1 tablet (  850 mg total) by mouth 2 (two) times daily with a meal.   mirabegron  ER 50 MG Tb24 tablet Commonly known as: MYRBETRIQ  Take 1 tablet (50 mg total) by mouth daily.   pravastatin  10 MG tablet Commonly known as: PRAVACHOL  Take 1 tablet (10 mg total) by mouth daily.   spironolactone  25 MG tablet Commonly known as: ALDACTONE  Take 1 tablet (25 mg total) by mouth daily.        Allergies: No Known Allergies  Family History: Family History  Problem Relation Age of Onset   Heart attack Father    Diabetes Mother    Kidney disease Brother     Social History:  reports that he has been smoking cigars. His smokeless tobacco use includes  snuff. He reports that he does not currently use alcohol. He reports that he does not use drugs.  ROS: For pertinent review of systems please refer to history of present illness  Physical Exam: There were no vitals taken for this visit.  Constitutional:  Well nourished. Alert and oriented, No acute distress. HEENT: [place] AT, moist mucus membranes.  Trachea midline Cardiovascular: No clubbing, cyanosis, or edema. Respiratory: Normal respiratory effort, no increased work of breathing. Neurologic: Grossly intact, no focal deficits, moving all 4 extremities. Psychiatric: Normal mood and affect.   Laboratory Data: See Epic and HPI   I have reviewed the labs.  Pertinent Imaging ***  Assessment & Plan:    1. Urge incontinence - continue Myrbetriq  50 mg  - avoid bladder irritants - continue good control of BS  2. Erectile dysfunction -continue Trimix 30/1/10, 0.5 cc prior to intercourse PRN  3. Prostate cancer -PSA pending    No follow-ups on file.  CLOTILDA HELON RIGGERS  [facility] [address], [address] [place], [place] [address] [phone]

## 2024-10-30 ENCOUNTER — Ambulatory Visit: Admitting: Urology

## 2024-10-31 NOTE — Progress Notes (Deleted)
 11/07/2024 9:29 PM   Daniel Kirby 1946-07-12 [id number]  Referring provider: Manya Toribio SQUIBB, PA [address] [place],  [place] [address]  Urological history: 1. Prostate cancer -PSA pending -s/p RPP > 10 years ago  2. UI -contributing factors of age, prostate cancer, pelvic surgery, sugary drinks, diabetes, depression and anxiety -Myrbetriq  25 mg daily   3. ED -contributing factors af age, prostate cancer, pelvic surgery, diabetes, HLD, HTN, hypothyroidism and smoking history -managed with ICI  No chief complaint on file.  HPI: Daniel Kirby is a 78 y.o. male who presents today for a yearly visit.      Previous records reviewed.   They are having (1 to 7) or (8 or more) daytime voids,  they are having nocturia (1-2) or (3 or more) and urgency is (none, mild, strong, severe).   They are having (stress, urge or mixed incontinence.)    they are having urinary leakage (1-2 times weekly, 3 or more times weekly, 1-2 times daily and 3 or more times daily) They are using absorbent products for leakage (no, sometimes, always )   the type of products they use are (panty liners, absorbant pads, depends) *** daily.  They are not limiting fluids.  They are not engaging in toilet mapping  ***  UA ***  PVR ***  Serum creatinine (08/2024) 1.11  Hemoglobin A1c (08/2024) 6.1  OAB agent: Myrbetriq  50 mg daily   Diuretics hydrochlorothiazide     Fluid consumption ***    PMH: Past Medical History:  Diagnosis Date   Abdominal pain    RUQ   Arthritis    right ankle   ED (erectile dysfunction)    HLD (hyperlipidemia)    Hypertension    Hypothyroid    Prostate cancer (HCC)    Vertigo    1 episode only, approx 1 month ago    Surgical History: Past Surgical History:  Procedure Laterality Date   CATARACT EXTRACTION W/PHACO Left 06/04/2021   Procedure: CATARACT EXTRACTION PHACO AND INTRAOCULAR LENS PLACEMENT (IOC) LEFT DIABETIC 5.51 01:10.7;  Surgeon: Mittie Gaskin, MD;  Location: [facility];  Service: Ophthalmology;  Laterality: Left;   COLONOSCOPY  2006   normal   COLONOSCOPY WITH PROPOFOL  N/A 09/06/2015   Procedure: COLONOSCOPY WITH PROPOFOL ;  Surgeon: Rogelia Copping, MD;  Location: [facility];  Service: Endoscopy;  Laterality: N/A;   POLYPECTOMY  09/06/2015   Procedure: POLYPECTOMY;  Surgeon: Rogelia Copping, MD;  Location: [facility];  Service: Endoscopy;;   PROSTATE SURGERY     robotic prostatectomy  2006    Home Medications:  Allergies as of 11/07/2024   No Known Allergies      Medication List        Accurate as of October 31, 2024  9:29 PM. If you have any questions, ask your nurse or doctor.          apixaban  5 MG Tabs tablet Commonly known as: ELIQUIS  Take 1 tablet (5 mg total) by mouth 2 (two) times daily.   glimepiride  2 MG tablet Commonly known as: AMARYL  One a day   levothyroxine  125 MCG tablet Commonly known as: SYNTHROID  Take 1 tablet (125 mcg total) by mouth daily. Take on empty stomach, first thing in the morning, full glass of water , 30 min before anything else   losartan -hydrochlorothiazide  50-12.5 MG tablet Commonly known as: HYZAAR Take 1 tablet by mouth daily.   metFORMIN  850 MG tablet Commonly known as: GLUCOPHAGE  Take 1 tablet (  850 mg total) by mouth 2 (two) times daily with a meal.   mirabegron  ER 50 MG Tb24 tablet Commonly known as: MYRBETRIQ  Take 1 tablet (50 mg total) by mouth daily.   pravastatin  10 MG tablet Commonly known as: PRAVACHOL  Take 1 tablet (10 mg total) by mouth daily.   spironolactone  25 MG tablet Commonly known as: ALDACTONE  Take 1 tablet (25 mg total) by mouth daily.        Allergies: No Known Allergies  Family History: Family History  Problem Relation Age of Onset   Heart attack Father    Diabetes Mother    Kidney disease Brother     Social History:  reports that he has been smoking cigars. His smokeless tobacco use includes  snuff. He reports that he does not currently use alcohol. He reports that he does not use drugs.  ROS: For pertinent review of systems please refer to history of present illness  Physical Exam: There were no vitals taken for this visit.  Constitutional:  Well nourished. Alert and oriented, No acute distress. HEENT: [place] AT, moist mucus membranes.  Trachea midline Cardiovascular: No clubbing, cyanosis, or edema. Respiratory: Normal respiratory effort, no increased work of breathing. Neurologic: Grossly intact, no focal deficits, moving all 4 extremities. Psychiatric: Normal mood and affect.   Laboratory Data: See Epic and HPI   I have reviewed the labs.  Pertinent Imaging ***  Assessment & Plan:    1. Urge incontinence - continue Myrbetriq  50 mg  - avoid bladder irritants - continue good control of BS  2. Erectile dysfunction -continue Trimix 30/1/10, 0.5 cc prior to intercourse PRN  3. Prostate cancer -PSA pending    No follow-ups on file.  CLOTILDA HELON RIGGERS  [facility] [address], [address] [place], [place] [address] [phone]

## 2024-11-07 ENCOUNTER — Ambulatory Visit: Admitting: Urology

## 2024-11-21 ENCOUNTER — Other Ambulatory Visit: Payer: Self-pay | Admitting: Physician Assistant

## 2024-11-21 NOTE — Telephone Encounter (Signed)
 Copied from CRM [id number]. Topic: Clinical - Medication Refill >> Nov 21, 2024 10:47 AM Larissa S wrote: Medication: apixaban  (ELIQUIS ) 5 MG TABS tablet  Has the patient contacted their pharmacy? Yes (Agent: If no, request that the patient contact the pharmacy for the refill. If patient does not wish to contact the pharmacy document the reason why and proceed with request.) (Agent: If yes, when and what did the pharmacy advise?)  This is the patient's preferred pharmacy:  [facility] [address] [place] - [address] [place] [address] Phone: [phone] Fax: [phone]     Is this the correct pharmacy for this prescription? Yes If no, delete pharmacy and type the correct one.   Has the prescription been filled recently? No  Is the patient out of the medication? Yes  Has the patient been seen for an appointment in the last year OR does the patient have an upcoming appointment? Yes  Can we respond through MyChart? No  Agent: Please be advised that Rx refills may take up to 3 business days. We ask that you follow-up with your pharmacy.

## 2024-11-22 NOTE — Telephone Encounter (Signed)
 Requested medication (s) are due for refill today: yes  Requested medication (s) are on the active medication list: yes  Last refill:  04/04/24  Future visit scheduled: yes  Notes to clinic:  Unable to refill per protocol, last refill by another provider.      Requested Prescriptions  Pending Prescriptions Disp Refills   apixaban  (ELIQUIS ) 5 MG TABS tablet 60 tablet 6    Sig: Take 1 tablet (5 mg total) by mouth 2 (two) times daily.     Hematology:  Anticoagulants - apixaban  Failed - 11/22/2024  4:29 PM      Failed - PLT in normal range and within 360 days    Platelets  Date Value Ref Range Status  09/30/2023 204 150 - 400 K/uL Final         Failed - HGB in normal range and within 360 days    Hemoglobin  Date Value Ref Range Status  09/30/2023 10.4 (L) 13.0 - 17.0 g/dL Final         Failed - HCT in normal range and within 360 days    HCT  Date Value Ref Range Status  09/30/2023 29.8 (L) 39.0 - 52.0 % Final         Passed - Cr in normal range and within 360 days    Creatinine, Ser  Date Value Ref Range Status  09/05/2024 1.11 0.76 - 1.27 mg/dL Final         Passed - AST in normal range and within 360 days    AST  Date Value Ref Range Status  09/05/2024 11 0 - 40 IU/L Final         Passed - ALT in normal range and within 360 days    ALT  Date Value Ref Range Status  09/05/2024 22 0 - 44 IU/L Final         Passed - Valid encounter within last 12 months    Recent Outpatient Visits           2 months ago Type 2 diabetes mellitus with other specified complication, unspecified whether long term insulin  use (HCC)   [place] [facility], Toribio SQUIBB, [place]   5 months ago Type 2 diabetes mellitus with other specified complication, without long-term current use of insulin  [facility])   [place] [facility], Toribio SQUIBB, [place]   10 months ago Type 2 diabetes mellitus without complication,  without long-term current use of insulin  [facility])   [place] [facility] Cathryne JAYSON, MD       Future Appointments             In 6 days McGowan, Clotilda DELENA RIGGERS [facility] [place]

## 2024-11-27 NOTE — Progress Notes (Unsigned)
 "   11/28/2024 3:14 PM   Daniel Kirby [birth year] [id number]  Referring provider: Manya Toribio SQUIBB, PA [address] [place],  [place] [address]  Urological history: 1. Prostate cancer -PSA pending -s/p RPP > 10 years ago  2. UI -contributing factors of age, prostate cancer, pelvic surgery, sugary drinks, diabetes, depression and anxiety -Myrbetriq  25 mg daily   3. ED -contributing factors af age, prostate cancer, pelvic surgery, diabetes, HLD, HTN, hypothyroidism and smoking history -managed with ICI  Chief Complaint  Patient presents with   Urge incontinence of urine   HPI: Daniel Kirby is a 79 y.o. male who presents today for a yearly visit with his son, Severin.    Previous records reviewed.   He is having 1-7 daytime voids, 1-2 episodes of nocturia, with a mild urge to urinate.  He wears 4 depends daily.  He does limit fluid intake and he does engage in toilet mapping.  He has been without his Myrbetriq  for almost 2 months if he has noticed significant increase in his urinary frequency and incontinence.  He is now going through 4 depends daily and when he was on the Myrbetriq  is 1-2 depends daily.  Patient denies any modifying or aggravating factors.  Patient denies any recent UTI's, gross hematuria, dysuria or suprapubic/flank pain.  Patient denies any fevers, chills, nausea or vomiting.    PVR 13 mL   Serum creatinine (08/2024) 1.11  Hemoglobin A1c (08/2024) 6.1  OAB agent: Myrbetriq  50 mg daily   Diuretics hydrochlorothiazide      PMH: Past Medical History:  Diagnosis Date   Abdominal pain    RUQ   Arthritis    right ankle   ED (erectile dysfunction)    HLD (hyperlipidemia)    Hypertension    Hypothyroid    Prostate cancer (HCC)    Vertigo    1 episode only, approx 1 month ago    Surgical History: Past Surgical History:  Procedure Laterality Date   CATARACT EXTRACTION W/PHACO Left 06/04/2021   Procedure: CATARACT EXTRACTION PHACO AND  INTRAOCULAR LENS PLACEMENT (IOC) LEFT DIABETIC 5.51 01:10.7;  Surgeon: Mittie Gaskin, MD;  Location: [facility];  Service: Ophthalmology;  Laterality: Left;   COLONOSCOPY  2006   normal   COLONOSCOPY WITH PROPOFOL  N/A 09/06/2015   Procedure: COLONOSCOPY WITH PROPOFOL ;  Surgeon: Rogelia Copping, MD;  Location: [facility];  Service: Endoscopy;  Laterality: N/A;   POLYPECTOMY  09/06/2015   Procedure: POLYPECTOMY;  Surgeon: Rogelia Copping, MD;  Location: [facility];  Service: Endoscopy;;   PROSTATE SURGERY     robotic prostatectomy  2006    Home Medications:  Allergies as of 11/28/2024   No Known Allergies      Medication List        Accurate as of November 28, 2024  3:14 PM. If you have any questions, ask your nurse or doctor.          apixaban  5 MG Tabs tablet Commonly known as: ELIQUIS  Take 1 tablet (5 mg total) by mouth 2 (two) times daily.   glimepiride  2 MG tablet Commonly known as: AMARYL  One a day   levothyroxine  125 MCG tablet Commonly known as: SYNTHROID  Take 1 tablet (125 mcg total) by mouth daily. Take on empty stomach, first thing in the morning, full glass of water , 30 min before anything else   losartan -hydrochlorothiazide  50-12.5 MG tablet Commonly known as: HYZAAR Take 1 tablet by mouth daily.   metFORMIN  850 MG  tablet Commonly known as: GLUCOPHAGE  Take 1 tablet (850 mg total) by mouth 2 (two) times daily with a meal.   mirabegron  ER 50 MG Tb24 tablet Commonly known as: MYRBETRIQ  Take 1 tablet (50 mg total) by mouth daily.   pravastatin  10 MG tablet Commonly known as: PRAVACHOL  Take 1 tablet (10 mg total) by mouth daily.   spironolactone  25 MG tablet Commonly known as: ALDACTONE  Take 1 tablet (25 mg total) by mouth daily.        Allergies: No Known Allergies  Family History: Family History  Problem Relation Age of Onset   Heart attack Father    Diabetes Mother    Kidney disease Brother     Social History:   reports that he has been smoking cigars. His smokeless tobacco use includes snuff. He reports that he does not currently use alcohol. He reports that he does not use drugs.  ROS: For pertinent review of systems please refer to history of present illness  Physical Exam: BP 128/73   Pulse 96   Wt 225 lb (102.1 kg)   SpO2 96%   BMI 35.24 kg/m   Constitutional:  Well nourished. Alert and oriented, No acute distress. HEENT: [place] AT, moist mucus membranes.  Trachea midline Cardiovascular: No clubbing, cyanosis, or edema. Respiratory: Normal respiratory effort, no increased work of breathing. Neurologic: Grossly intact, no focal deficits, moving all 4 extremities. Psychiatric: Normal mood and affect.   Laboratory Data: See Epic and HPI   I have reviewed the labs.  Pertinent Imaging  11/28/24 15:02  Scan Result 13mL    Assessment & Plan:    1. Urge incontinence - restart Myrbetriq  50 mg  - avoid bladder irritants - continue good control of BS  2. Prostate cancer -PSA pending    Return in about 1 year (around 11/28/2025) for PSA, I PSS .  Daniel Kirby  [facility] [address] [place], [place] [address] [phone] "

## 2024-11-28 ENCOUNTER — Ambulatory Visit: Admitting: Urology

## 2024-11-28 ENCOUNTER — Encounter: Payer: Self-pay | Admitting: Urology

## 2024-11-28 VITALS — BP 128/73 | HR 96 | Wt 225.0 lb

## 2024-11-28 DIAGNOSIS — C61 Malignant neoplasm of prostate: Secondary | ICD-10-CM | POA: Diagnosis not present

## 2024-11-28 DIAGNOSIS — N3941 Urge incontinence: Secondary | ICD-10-CM

## 2024-11-28 LAB — BLADDER SCAN AMB NON-IMAGING

## 2024-11-28 MED ORDER — MIRABEGRON ER 50 MG PO TB24: 50.0000 mg | ORAL_TABLET | Freq: Every day | ORAL | 3 refills | Status: AC

## 2024-11-29 ENCOUNTER — Ambulatory Visit: Payer: Self-pay | Admitting: Urology

## 2024-11-29 LAB — PSA: Prostate Specific Ag, Serum: <0.1 ng/mL (ref 0.0–4.0)

## 2024-11-29 NOTE — Telephone Encounter (Signed)
 Left patient vm message to call back in regards to lab results.  Andrea Kirks LPN

## 2024-11-29 NOTE — Telephone Encounter (Signed)
-----   Message from [facility] sent at 11/29/2024  9:01 AM EST ----- Please let Daniel Kirby know that his PSA has returned undetectable.  This is good news!  We will see you next year.

## 2024-12-05 ENCOUNTER — Ambulatory Visit: Admitting: Physician Assistant

## 2024-12-05 ENCOUNTER — Encounter (INDEPENDENT_AMBULATORY_CARE_PROVIDER_SITE_OTHER)

## 2024-12-05 ENCOUNTER — Ambulatory Visit (INDEPENDENT_AMBULATORY_CARE_PROVIDER_SITE_OTHER): Admitting: Vascular Surgery

## 2024-12-06 ENCOUNTER — Ambulatory Visit: Admitting: Physician Assistant

## 2024-12-18 ENCOUNTER — Other Ambulatory Visit: Payer: Self-pay | Admitting: Physician Assistant

## 2024-12-18 DIAGNOSIS — E1169 Type 2 diabetes mellitus with other specified complication: Secondary | ICD-10-CM

## 2024-12-19 NOTE — Telephone Encounter (Signed)
 Requested medications are due for refill today.  yes  Requested medications are on the active medications list.  yes  Last refill. 05/30/2024 #180 1 rf  Future visit scheduled.   yes  Notes to clinic.  Expired labs  - cbc    Requested Prescriptions  Pending Prescriptions Disp Refills   metFORMIN  (GLUCOPHAGE ) 850 MG tablet [Pharmacy Med Name: metFORMIN  HCl 850 MG Oral Tablet] 180 tablet 0    Sig: TAKE 1 TABLET BY MOUTH TWICE DAILY WITH A MEAL     Endocrinology:  Diabetes - Biguanides Failed - 12/19/2024  5:25 PM      Failed - B12 Level in normal range and within 720 days    No results found for: VITAMINB12       Failed - CBC within normal limits and completed in the last 12 months    WBC  Date Value Ref Range Status  09/30/2023 6.0 4.0 - 10.5 K/uL Final   RBC  Date Value Ref Range Status  09/30/2023 3.26 (L) 4.22 - 5.81 MIL/uL Final   Hemoglobin  Date Value Ref Range Status  09/30/2023 10.4 (L) 13.0 - 17.0 g/dL Final   HCT  Date Value Ref Range Status  09/30/2023 29.8 (L) 39.0 - 52.0 % Final   MCHC  Date Value Ref Range Status  09/30/2023 34.9 30.0 - 36.0 g/dL Final   [facility]  Date Value Ref Range Status  09/30/2023 31.9 26.0 - 34.0 pg Final   MCV  Date Value Ref Range Status  09/30/2023 91.4 80.0 - 100.0 fL Final   No results found for: PLTCOUNTKUC, LABPLAT, POCPLA RDW  Date Value Ref Range Status  09/30/2023 13.3 11.5 - 15.5 % Final         Passed - Cr in normal range and within 360 days    Creatinine, Ser  Date Value Ref Range Status  09/05/2024 1.11 0.76 - 1.27 mg/dL Final         Passed - HBA1C is between 0 and 7.9 and within 180 days    Hemoglobin A1C  Date Value Ref Range Status  09/05/2024 5.7 (A) 4.0 - 5.6 % Final   Hgb A1c MFr Bld  Date Value Ref Range Status  09/05/2024 6.1 (H) 4.8 - 5.6 % Final    Comment:             Prediabetes: 5.7 - 6.4          Diabetes: >6.4          Glycemic control for adults with diabetes: <7.0           Passed - eGFR in normal range and within 360 days    GFR calc Af Amer  Date Value Ref Range Status  11/13/2020 84 >59 mL/min/1.73 Final    Comment:    **In accordance with recommendations from the NKF-ASN Task force,**   [facility] is in the process of updating its eGFR calculation to the   2021 CKD-EPI creatinine equation that estimates kidney function   without a race variable.    GFR, Estimated  Date Value Ref Range Status  09/30/2023 >60 >60 mL/min Final    Comment:    (NOTE) Calculated using the CKD-EPI Creatinine Equation (2021)    eGFR  Date Value Ref Range Status  09/05/2024 68 >59 mL/min/1.73 Final         Passed - Valid encounter within last 6 months    Recent Outpatient Visits  3 months ago Type 2 diabetes mellitus with other specified complication, unspecified whether long term insulin  use (HCC)   [place] [facility] at [facility], Toribio SQUIBB, [place]   6 months ago Type 2 diabetes mellitus with other specified complication, without long-term current use of insulin  [facility])   [place] [facility] at [facility], Toribio SQUIBB, [place]   10 months ago Type 2 diabetes mellitus without complication, without long-term current use of insulin  [facility])   [place] [facility] Cathryne JAYSON, MD       Future Appointments             In 11 months McGowan, Clotilda DELENA RIGGERS [facility] [place]

## 2024-12-20 ENCOUNTER — Other Ambulatory Visit: Payer: Self-pay

## 2025-01-02 ENCOUNTER — Ambulatory Visit: Admitting: Physician Assistant

## 2025-01-02 ENCOUNTER — Ambulatory Visit (INDEPENDENT_AMBULATORY_CARE_PROVIDER_SITE_OTHER): Admitting: Nurse Practitioner

## 2025-01-02 ENCOUNTER — Encounter (INDEPENDENT_AMBULATORY_CARE_PROVIDER_SITE_OTHER)

## 2025-06-21 ENCOUNTER — Ambulatory Visit

## 2025-11-21 ENCOUNTER — Other Ambulatory Visit

## 2025-11-28 ENCOUNTER — Ambulatory Visit: Admitting: Urology
